# Patient Record
Sex: Male | Born: 1970 | Race: Black or African American | Hispanic: No | Smoking: Current every day smoker
Health system: Southern US, Community
[De-identification: ages and names within clinical notes are randomized; demographics above are authoritative.]

## PROBLEM LIST (undated history)

## (undated) DIAGNOSIS — I10 Essential (primary) hypertension: Secondary | ICD-10-CM

## (undated) DIAGNOSIS — J189 Pneumonia, unspecified organism: Secondary | ICD-10-CM

## (undated) DIAGNOSIS — E785 Hyperlipidemia, unspecified: Secondary | ICD-10-CM

## (undated) DIAGNOSIS — E119 Type 2 diabetes mellitus without complications: Secondary | ICD-10-CM

## (undated) DIAGNOSIS — Z720 Tobacco use: Secondary | ICD-10-CM

## (undated) DIAGNOSIS — N529 Male erectile dysfunction, unspecified: Secondary | ICD-10-CM

## (undated) HISTORY — DX: Male erectile dysfunction, unspecified: N52.9

## (undated) HISTORY — DX: Type 2 diabetes mellitus without complications: E11.9

## (undated) HISTORY — PX: NO PAST SURGERIES: SHX2092

---

## 2016-01-21 ENCOUNTER — Encounter: Payer: Self-pay | Admitting: *Deleted

## 2016-01-21 ENCOUNTER — Emergency Department: Payer: Self-pay

## 2016-01-21 ENCOUNTER — Emergency Department
Admission: EM | Admit: 2016-01-21 | Discharge: 2016-01-21 | Disposition: A | Discharge status: Home / Self Care | Payer: Self-pay | Attending: Emergency Medicine | Admitting: Emergency Medicine

## 2016-01-21 DIAGNOSIS — F17210 Nicotine dependence, cigarettes, uncomplicated: Secondary | ICD-10-CM | POA: Insufficient documentation

## 2016-01-21 DIAGNOSIS — J189 Pneumonia, unspecified organism: Secondary | ICD-10-CM

## 2016-01-21 DIAGNOSIS — J181 Lobar pneumonia, unspecified organism: Secondary | ICD-10-CM | POA: Insufficient documentation

## 2016-01-21 DIAGNOSIS — I10 Essential (primary) hypertension: Secondary | ICD-10-CM | POA: Insufficient documentation

## 2016-01-21 MED ORDER — GUAIFENESIN-CODEINE 100-10 MG/5ML PO SOLN: 5.0000 mL | Freq: Three times a day (TID) | ORAL | 0 refills | Status: DC | PRN

## 2016-01-21 MED ORDER — AZITHROMYCIN 500 MG PO TABS
500.0000 mg | ORAL_TABLET | Freq: Once | ORAL | Status: AC
  Administered 2016-01-21: 500 mg via ORAL
  Filled 2016-01-21: qty 1

## 2016-01-21 MED ORDER — AMLODIPINE BESYLATE 5 MG PO TABS: 5.0000 mg | ORAL_TABLET | Freq: Every day | ORAL | 1 refills | Status: DC

## 2016-01-21 MED ORDER — AZITHROMYCIN 250 MG PO TABS: 250.0000 mg | ORAL_TABLET | Freq: Every day | ORAL | 0 refills | Status: AC

## 2016-01-21 MED ORDER — GUAIFENESIN-CODEINE 100-10 MG/5ML PO SOLN
10.0000 mL | Freq: Once | ORAL | Status: AC
  Administered 2016-01-21: 10 mL via ORAL
  Filled 2016-01-21: qty 10

## 2016-01-21 MED ORDER — ALBUTEROL SULFATE (2.5 MG/3ML) 0.083% IN NEBU
5.0000 mg | INHALATION_SOLUTION | Freq: Once | RESPIRATORY_TRACT | Status: AC
  Administered 2016-01-21: 5 mg via RESPIRATORY_TRACT

## 2016-01-21 MED ORDER — ALBUTEROL SULFATE (2.5 MG/3ML) 0.083% IN NEBU
INHALATION_SOLUTION | RESPIRATORY_TRACT | Status: DC
Start: 2016-01-21 — End: 2016-01-21
  Filled 2016-01-21: qty 6

## 2016-01-21 MED ORDER — ALBUTEROL SULFATE HFA 108 (90 BASE) MCG/ACT IN AERS: 2.0000 | INHALATION_SPRAY | Freq: Four times a day (QID) | RESPIRATORY_TRACT | 0 refills | Status: DC | PRN

## 2016-01-21 NOTE — ED Triage Notes (Signed)
Pt reports cough, congestion for a week, pt reports sputum is yellow/grey

## 2016-01-21 NOTE — ED Provider Notes (Signed)
[facility] Emergency Department Provider Note ____________________________________________  Time seen: 1327  I have reviewed the triage vital signs and the nursing notes.  HISTORY  Chief Complaint  Cough  HPI Christopher Harrell is a 45 y.o. male presents to the ED for evaluation of a one-week complaint of productive cough, congestion, and wheezing. The patient describes increased work of breathing to get the air out of his lungs. He does admit to being an every day smoker and notes that he has had a bronchitis or pneumonia every year and recent history. He also notes previous hospitalization for antibiotics for at least 24 hours secondary to his pneumonia.Additionally hit his wife has recently relocated to the area and he's been out of his blood pressure medicines for several months.  Past Medical History:  Diagnosis Date  . Hypertension     There are no active problems to display for this patient.  History reviewed. No pertinent surgical history.  Prior to Admission medications   Medication Sig Start Date End Date Taking? Authorizing Provider  albuterol (PROVENTIL HFA;VENTOLIN HFA) 108 (90 Base) MCG/ACT inhaler Inhale 2 puffs into the lungs every 6 (six) hours as needed for wheezing or shortness of breath. 01/21/16   Khing Belcher V Bacon Alok Minshall, PA-C  amLODipine (NORVASC) 5 MG tablet Take 1 tablet (5 mg total) by mouth daily. 01/21/16 01/20/17  Caitlyne Ingham V Bacon Deovion Batrez, PA-C  azithromycin (ZITHROMAX Z-PAK) 250 MG tablet Take 1 tablet (250 mg total) by mouth daily. 01/21/16 01/25/16  Shenaya Lebo V Bacon Rob Mciver, PA-C  guaiFENesin-codeine 100-10 MG/5ML syrup Take 5 mLs by mouth 3 (three) times daily as needed for cough. 01/21/16   Markee Matera V Bacon Tylan Briguglio, PA-C   Allergies Review of patient's allergies indicates no known allergies.  No family history on file.  Social History Social History  Substance Use Topics  . Smoking status: Current Every Day Smoker    Packs/day: 0.50   Types: Cigarettes  . Smokeless tobacco: Never Used  . Alcohol use No    Review of Systems  Constitutional: Positive for subjective fever. Eyes: Negative for visual changes. ENT: Negative for sore throat. Cardiovascular: Negative for chest pain. Respiratory: Also did for shortness of breath, wheezing, productive cough. Gastrointestinal: Negative for abdominal pain, vomiting and diarrhea. Musculoskeletal: Negative for chest wall pain. Skin: Negative for rash. Neurological: Negative for headaches, focal weakness or numbness. ____________________________________________  PHYSICAL EXAM:  VITAL SIGNS: ED Triage Vitals [01/21/16 1252]  Enc Vitals Group     BP (!) 186/128     Pulse Rate 95     Resp 16     Temp 98.1 F (36.7 C)     Temp Source Oral     SpO2 97 %     Weight      Height      Head Circumference      Peak Flow      Pain Score      Pain Loc      Pain Edu?      Excl. in [place]?    Constitutional: Alert and oriented. Well appearing and in no distress. Head: Normocephalic and atraumatic.      Eyes: Conjunctivae are normal. PERRL. Normal extraocular movements      Ears: Canals clear. TMs intact bilaterally.   Nose: No congestion/rhinorrhea.   Mouth/Throat: Mucous membranes are moist.   Neck: Supple. No thyromegaly. Hematological/Lymphatic/Immunological: No cervical lymphadenopathy. Cardiovascular: Normal rate, regular rhythm.  Respiratory: Normal respiratory effort. Audible wheezes and rhonchi throughout his lung fields.  Gastrointestinal: Soft and nontender. No distention. Musculoskeletal: Nontender with normal range of motion in all extremities.  Neurologic:  Normal gait without ataxia. Normal speech and language. No gross focal neurologic deficits are appreciated. Skin:  Skin is warm, dry and intact. No rash noted. ____________________________________________   RADIOLOGY  CXR IMPRESSION: Left lower lobe pneumonia.  I, Antonieta Slaven, Dannielle Karvonen,  personally viewed and evaluated these images (plain radiographs) as part of my medical decision making, as well as reviewing the written report by the radiologist. ____________________________________________  PROCEDURES  DuoNeb x 1 Azithromycin 500 mg PO ____________________________________________  INITIAL IMPRESSION / ASSESSMENT AND PLAN / ED COURSE  Patient with a radiology confirmed lower lobe pneumonia. He also has uncontrolled hypertension has been out of his blood pressure meds for several months. Given his of response to treatment and his stable vital signs the patient is agreeable to treat as an outpatient for this presentation. He is given return precautions and verbalizes understanding. He is also provided with a work note for the next 3 days. He will rest, hydrate, and take the remainder of the azithromycin, guaifenesin/codeine syrup, and albuterol as directed. He is also provided with a new prescription for amlodipine to start for uncontrolled hypertension. He is referred to the medical management program of [facility] as well as the local community clinics for routine medical care.  Clinical Course   ____________________________________________  FINAL CLINICAL IMPRESSION(S) / ED DIAGNOSES  Final diagnoses:  Community acquired pneumonia  Uncontrolled hypertension  LLL pneumonia      Christopher Needles, PA-C 01/21/16 [place] Yao, MD 01/24/16 [phone]

## 2016-01-21 NOTE — Discharge Instructions (Signed)
Your x-ray revealed a pneumonia in your left lung. You have been started on an antibiotic in the ED. It is to be dosed 1 tab daily for the next 4 days. You may also use the prescription cough syrup for cough and chest discomfort. You are also being started on a class of blood pressure medicine called a calcium channel blocker. It will lower your blood pressure by allowing your blood vessels to dilate. You should monitor your blood pressure regularly and follow-up with one of the local community clinics for routine medical care. Return to the ED  immediately for signs of worsening cough, shortness of breath, or fevers.

## 2016-01-23 ENCOUNTER — Observation Stay
Admission: EM | Admit: 2016-01-23 | Discharge: 2016-01-24 | Disposition: A | Discharge status: Home / Self Care | Payer: Self-pay | Attending: Internal Medicine | Admitting: Internal Medicine

## 2016-01-23 ENCOUNTER — Emergency Department: Payer: Self-pay

## 2016-01-23 ENCOUNTER — Encounter: Payer: Self-pay | Admitting: Emergency Medicine

## 2016-01-23 DIAGNOSIS — I10 Essential (primary) hypertension: Secondary | ICD-10-CM | POA: Diagnosis present

## 2016-01-23 DIAGNOSIS — R51 Headache: Secondary | ICD-10-CM | POA: Insufficient documentation

## 2016-01-23 DIAGNOSIS — R079 Chest pain, unspecified: Secondary | ICD-10-CM | POA: Diagnosis present

## 2016-01-23 DIAGNOSIS — Z79899 Other long term (current) drug therapy: Secondary | ICD-10-CM | POA: Insufficient documentation

## 2016-01-23 DIAGNOSIS — J690 Pneumonitis due to inhalation of food and vomit: Secondary | ICD-10-CM

## 2016-01-23 DIAGNOSIS — R748 Abnormal levels of other serum enzymes: Secondary | ICD-10-CM | POA: Insufficient documentation

## 2016-01-23 DIAGNOSIS — J441 Chronic obstructive pulmonary disease with (acute) exacerbation: Secondary | ICD-10-CM

## 2016-01-23 DIAGNOSIS — J189 Pneumonia, unspecified organism: Secondary | ICD-10-CM | POA: Diagnosis present

## 2016-01-23 DIAGNOSIS — R7989 Other specified abnormal findings of blood chemistry: Secondary | ICD-10-CM | POA: Diagnosis present

## 2016-01-23 DIAGNOSIS — F17210 Nicotine dependence, cigarettes, uncomplicated: Secondary | ICD-10-CM | POA: Insufficient documentation

## 2016-01-23 DIAGNOSIS — R0781 Pleurodynia: Principal | ICD-10-CM | POA: Insufficient documentation

## 2016-01-23 DIAGNOSIS — R778 Other specified abnormalities of plasma proteins: Secondary | ICD-10-CM | POA: Diagnosis present

## 2016-01-23 HISTORY — DX: Pneumonia, unspecified organism: J18.9

## 2016-01-23 HISTORY — DX: Essential (primary) hypertension: I10

## 2016-01-23 HISTORY — DX: Tobacco use: Z72.0

## 2016-01-23 LAB — CBC
HEMATOCRIT: 47.6 % (ref 40.0–52.0)
Hemoglobin: 16.7 g/dL (ref 13.0–18.0)
MCH: 30.7 pg (ref 26.0–34.0)
MCHC: 35.1 g/dL (ref 32.0–36.0)
MCV: 87.6 fL (ref 80.0–100.0)
Platelets: 256 10*3/uL (ref 150–440)
RBC: 5.43 MIL/uL (ref 4.40–5.90)
RDW: 13.9 % (ref 11.5–14.5)
WBC: 6.9 10*3/uL (ref 3.8–10.6)

## 2016-01-23 LAB — BASIC METABOLIC PANEL
Anion gap: 7 (ref 5–15)
BUN: 13 mg/dL (ref 6–20)
CHLORIDE: 106 mmol/L (ref 101–111)
CO2: 27 mmol/L (ref 22–32)
Calcium: 9.5 mg/dL (ref 8.9–10.3)
Creatinine, Ser: 1 mg/dL (ref 0.61–1.24)
GFR calc Af Amer: >60 mL/min (ref >60)
GFR calc non Af Amer: >60 mL/min (ref >60)
GLUCOSE: 124 mg/dL — AB (ref 65–99)
POTASSIUM: 3.5 mmol/L (ref 3.5–5.1)
SODIUM: 140 mmol/L (ref 135–145)

## 2016-01-23 LAB — TROPONIN I
Troponin I: 0.03 ng/mL (ref <0.03)
Troponin I: 0.03 ng/mL (ref <0.03)

## 2016-01-23 MED ORDER — HYDROCHLOROTHIAZIDE 50 MG PO TABS
50.0000 mg | ORAL_TABLET | Freq: Every day | ORAL | Status: DC
  Administered 2016-01-23 – 2016-01-24 (×2): 50 mg via ORAL
  Filled 2016-01-23 (×2): qty 1

## 2016-01-23 MED ORDER — OXYCODONE HCL 5 MG PO TABS
5.0000 mg | ORAL_TABLET | ORAL | Status: DC | PRN
  Administered 2016-01-23 – 2016-01-24 (×3): 5 mg via ORAL
  Filled 2016-01-23 (×3): qty 1

## 2016-01-23 MED ORDER — KETOROLAC TROMETHAMINE 30 MG/ML IJ SOLN
30.0000 mg | Freq: Once | INTRAMUSCULAR | Status: AC
  Administered 2016-01-23: 30 mg via INTRAVENOUS
  Filled 2016-01-23: qty 1

## 2016-01-23 MED ORDER — NITROGLYCERIN 2 % TD OINT
0.5000 [in_us] | TOPICAL_OINTMENT | Freq: Once | TRANSDERMAL | Status: AC
  Administered 2016-01-23: 0.5 [in_us] via TOPICAL

## 2016-01-23 MED ORDER — PREDNISONE 20 MG PO TABS
60.0000 mg | ORAL_TABLET | Freq: Once | ORAL | Status: AC
  Administered 2016-01-23: 60 mg via ORAL
  Filled 2016-01-23: qty 3

## 2016-01-23 MED ORDER — IPRATROPIUM-ALBUTEROL 0.5-2.5 (3) MG/3ML IN SOLN
3.0000 mL | Freq: Once | RESPIRATORY_TRACT | Status: AC
  Administered 2016-01-23: 3 mL via RESPIRATORY_TRACT
  Filled 2016-01-23: qty 3

## 2016-01-23 MED ORDER — LABETALOL HCL 5 MG/ML IV SOLN
10.0000 mg | Freq: Once | INTRAVENOUS | Status: AC
  Administered 2016-01-23: 10 mg via INTRAVENOUS
  Filled 2016-01-23: qty 4

## 2016-01-23 MED ORDER — LABETALOL HCL 5 MG/ML IV SOLN
10.0000 mg | INTRAVENOUS | Status: DC | PRN
  Administered 2016-01-23 – 2016-01-24 (×2): 10 mg via INTRAVENOUS
  Filled 2016-01-23 (×3): qty 4

## 2016-01-23 MED ORDER — ACETAMINOPHEN 500 MG PO TABS
1000.0000 mg | ORAL_TABLET | Freq: Once | ORAL | Status: AC
  Administered 2016-01-23: 1000 mg via ORAL
  Filled 2016-01-23: qty 2

## 2016-01-23 MED ORDER — NITROGLYCERIN 2 % TD OINT
TOPICAL_OINTMENT | TRANSDERMAL | Status: AC
  Administered 2016-01-23: 0.5 [in_us] via TOPICAL
  Filled 2016-01-23: qty 1

## 2016-01-23 NOTE — ED Triage Notes (Addendum)
Pt arrived to ED with c/o of SOB with a productive cough that been going on for about a week. Pt states he is coughing up "dark gray" mucus. Pt recently seen in ED and dx with pneumonia. Pt taking rx but "getting worse".

## 2016-01-23 NOTE — ED Provider Notes (Signed)
[facility] Emergency Department Provider Note  ____________________________________________  Time seen: Approximately 4:40 PM  I have reviewed the triage vital signs and the nursing notes.   HISTORY  Chief Complaint Shortness of Breath   HPI Christopher Harrell is a 45 y.o. male history of hypertension and smoking who presents for evaluation of shortness of breath and chest tightness. Patient was seen here 2 days ago and was diagnosed with left-sided pneumonia. He was started on Z-Pak, albuterol, and he was restarted on his antihypertensive medication that he had been off for over a year. Patient reports that he's been taking the Z-Pak as prescribed. He is also using the albuterol 3 times a day and reports relief of his shortness of breath and chest tightness when he uses it however symptoms recur. He reports that he could barely sleep last night due to the chest pain and coughing. He describes the chest pain as tightness, that resolves with albuterol, associated with wheezing. He has not filled the prescription for his antihypertensive. He continues to smoke. He also endorses wheezing. No longer having fever or chills. He has a productive cough with yellow phlegm.  Past Medical History:  Diagnosis Date  . Hypertension     There are no active problems to display for this patient.   History reviewed. No pertinent surgical history.  Prior to Admission medications   Medication Sig Start Date End Date Taking? Authorizing Provider  albuterol (PROVENTIL HFA;VENTOLIN HFA) 108 (90 Base) MCG/ACT inhaler Inhale 2 puffs into the lungs every 6 (six) hours as needed for wheezing or shortness of breath. 01/21/16  Yes Jenise V Bacon Menshew, PA-C  azithromycin (ZITHROMAX Z-PAK) 250 MG tablet Take 1 tablet (250 mg total) by mouth daily. Patient taking differently: Take 250 mg by mouth daily. Took 2 tablets on 01/22/2016 Took 1 tablet on 01/23/2016 01/21/16 01/25/16 Yes Jenise V  Bacon Menshew, PA-C  amLODipine (NORVASC) 5 MG tablet Take 1 tablet (5 mg total) by mouth daily. Patient not taking: Reported on 01/23/2016 01/21/16 01/20/17  Alita Chyle Bacon Menshew, PA-C  guaiFENesin-codeine 100-10 MG/5ML syrup Take 5 mLs by mouth 3 (three) times daily as needed for cough. Patient not taking: Reported on 01/23/2016 01/21/16   Dannielle Karvonen Menshew, PA-C    Allergies Review of patient's allergies indicates no known allergies.  History reviewed. No pertinent family history.  Social History Social History  Substance Use Topics  . Smoking status: Current Every Day Smoker    Packs/day: 0.50    Types: Cigarettes  . Smokeless tobacco: Never Used  . Alcohol use No    Review of Systems  Constitutional: Negative for fever. Eyes: Negative for visual changes. ENT: Negative for sore throat. Cardiovascular: + chest tightness Respiratory: + SOB and wheezing and coughing Gastrointestinal: Negative for abdominal pain, vomiting or diarrhea. Genitourinary: Negative for dysuria. Musculoskeletal: Negative for back pain. Skin: Negative for rash. Neurological: Negative for headaches, weakness or numbness.  ____________________________________________   PHYSICAL EXAM:  VITAL SIGNS: ED Triage Vitals  Enc Vitals Group     BP 01/23/16 1507 (!) 169/115     Pulse Rate 01/23/16 1507 92     Resp 01/23/16 1507 18     Temp 01/23/16 1507 98.6 F (37 C)     Temp Source 01/23/16 1507 Oral     SpO2 01/23/16 1507 99 %     Weight 01/23/16 1508 190 lb (86.2 kg)     Height 01/23/16 1508 5\' 9"  (1.753 m)  Head Circumference --      Peak Flow --      Pain Score 01/23/16 1517 7     Pain Loc --      Pain Edu? --      Excl. in [place]? --     Constitutional: Alert and oriented. Well appearing and in no apparent distress. HEENT:      Head: Normocephalic and atraumatic.         Eyes: Conjunctivae are normal. Sclera is non-icteric. EOMI. PERRL      Mouth/Throat: Mucous membranes are moist.         Neck: Supple with no signs of meningismus. Cardiovascular: Regular rate and rhythm. No murmurs, gallops, or rubs. 2+ symmetrical distal pulses are present in all extremities. No JVD. Respiratory: Normal respiratory effort. Decreased air movement bilaterally with expiratory wheezes  Gastrointestinal: Soft, non tender, and non distended with positive bowel sounds. No rebound or guarding. Musculoskeletal: Nontender with normal range of motion in all extremities. No edema, cyanosis, or erythema of extremities. Neurologic: Normal speech and language. Face is symmetric. Moving all extremities. No gross focal neurologic deficits are appreciated. Skin: Skin is warm, dry and intact. No rash noted. Psychiatric: Mood and affect are normal. Speech and behavior are normal.  ____________________________________________   LABS (all labs ordered are listed, but only abnormal results are displayed)  Labs Reviewed  BASIC METABOLIC PANEL - Abnormal; Notable for the following:       Result Value   Glucose, Bld 124 (*)    All other components within normal limits  TROPONIN I - Abnormal; Notable for the following:    Troponin I 0.03 (*)    All other components within normal limits  TROPONIN I - Abnormal; Notable for the following:    Troponin I 0.03 (*)    All other components within normal limits  CBC   ____________________________________________  EKG  ED ECG REPORT I, Rudene Re, the attending physician, personally viewed and interpreted this ECG.  Normal sinus rhythm, rate of 83, normal intervals, normal axis, T-wave inversions on inferior and lateral leads. No prior for comparison. No ST elevations.  19:14 - Normal sinus rhythm, rate of 80, normal intervals, normal axis, resolution of T-wave inversions in lateral leads and improvement on the inferior leads. No ST elevation ____________________________________________  RADIOLOGY  CXR: Improved left lower lobe pneumonia.  No new  findings ____________________________________________   PROCEDURES  Procedure(s) performed: None Procedures Critical Care performed:  None ____________________________________________   INITIAL IMPRESSION / ASSESSMENT AND PLAN / ED COURSE  45 y.o. male history of hypertension and smoking who presents for evaluation of shortness of breath and chest tightness after being diagnosed with pneumonia 2 days ago. Chest x-ray showing improving pneumonia. Patient has no tachycardia, no fever, normal white count. EKG done in triage showing T-wave inversions on inferior and lateral leads with no prior for comparison. Patient's chest tightness in the setting of wheezing and decreased air movement which improves with albuterol is concerning for COPD exacerbation on a active smoker with current pneumonia and less likely to be ACS. Initial troponin was 0.03. Watch patient for 3 hours on telemetry, given DuoNeb treatments, prednisone, patient already took his azithromycin today. We'll get a repeat troponin and EKG 3 hours and discussed for close follow-up with cardiology. We'll restart patient on hydrochlorothiazide for hypertension.  Clinical Course  Comment By Time  Patient's chest pain and shortness of breath improved with DuoNeb treatments. His initial EKG in triage was concerning and  a repeat was done which showed resolution the T wave inversions on the lateral leads and improvement on the inferior leads. Second troponin remains unchanged at 0.03. Patient remains persistently hypertensive, IV labetalol was ordered at this time. I discussed patient with Dr. Abigail Butts, cardiology on call who evaluated the EKGs and recommended admission and for stress test. Patient is in agreement with this plan. I discussed with the hospitalist and we'll admit at this time. Rudene Re, MD 09/24 2054    Pertinent labs & imaging results that were available during my care of the patient were reviewed by me and considered in  my medical decision making (see chart for details).    ____________________________________________   FINAL CLINICAL IMPRESSION(S) / ED DIAGNOSES  Final diagnoses:  Chest pain, unspecified chest pain type  Aspiration pneumonia of left lung, unspecified aspiration pneumonia type, unspecified part of lung ([place])  COPD exacerbation ([place])      NEW MEDICATIONS STARTED DURING THIS VISIT:  New Prescriptions   No medications on file     Note:  This document was prepared using Dragon voice recognition software and may include unintentional dictation errors.    Rudene Re, MD 01/23/16 [phone]

## 2016-01-23 NOTE — H&P (Signed)
[name] [place] NAME: Christopher Harrell    MR#:  [id number]  DATE OF BIRTH:  1970-06-24  DATE OF ADMISSION:  01/23/2016  PRIMARY CARE PHYSICIAN: No PCP Per Patient   REQUESTING/REFERRING PHYSICIAN: Alfred Levins, MD  CHIEF COMPLAINT:   Chief Complaint  Patient presents with  . Shortness of Breath    HISTORY OF PRESENT ILLNESS:  Christopher Harrell  is a 45 y.o. male who presents with Chest pain and shortness of breath. Patient states that he was diagnosed with pneumonia several days ago and was placed on azithromycin. Today he developed significant chest pain as well as back/flank pain, so he came to the ED for further evaluation. Chest x-ray shows improving pneumonia. However, his EKG showed T-wave inversions in inferior and lateral leads, which improved some in the lateral leads with nitroglycerin. Cardiology was contacted from the ED and recommended patient come in for monitoring of his cardiac enzymes and possible stress test tomorrow. Hospitalists were called for admission  PAST MEDICAL HISTORY:   Past Medical History:  Diagnosis Date  . Hypertension     PAST SURGICAL HISTORY:   Past Surgical History:  Procedure Laterality Date  . NO PAST SURGERIES      SOCIAL HISTORY:   Social History  Substance Use Topics  . Smoking status: Current Every Day Smoker    Packs/day: 0.50    Types: Cigarettes  . Smokeless tobacco: Never Used  . Alcohol use No    FAMILY HISTORY:  History reviewed. No pertinent family history.  DRUG ALLERGIES:  No Known Allergies  MEDICATIONS AT HOME:   Prior to Admission medications   Medication Sig Start Date End Date Taking? Authorizing Provider  albuterol (PROVENTIL HFA;VENTOLIN HFA) 108 (90 Base) MCG/ACT inhaler Inhale 2 puffs into the lungs every 6 (six) hours as needed for wheezing or shortness of breath. 01/21/16  Yes Jenise V Bacon Menshew, PA-C  azithromycin (ZITHROMAX Z-PAK) 250 MG tablet  Take 1 tablet (250 mg total) by mouth daily. Patient taking differently: Take 250 mg by mouth daily. Took 2 tablets on 01/22/2016 Took 1 tablet on 01/23/2016 01/21/16 01/25/16 Yes Jenise V Bacon Menshew, PA-C  amLODipine (NORVASC) 5 MG tablet Take 1 tablet (5 mg total) by mouth daily. Patient not taking: Reported on 01/23/2016 01/21/16 01/20/17  Alita Chyle Bacon Menshew, PA-C  guaiFENesin-codeine 100-10 MG/5ML syrup Take 5 mLs by mouth 3 (three) times daily as needed for cough. Patient not taking: Reported on 01/23/2016 01/21/16   Dannielle Karvonen Menshew, PA-C    REVIEW OF SYSTEMS:  Review of Systems  Constitutional: Negative for chills, fever, malaise/fatigue and weight loss.  HENT: Negative for ear pain, hearing loss and tinnitus.   Eyes: Negative for blurred vision, double vision, pain and redness.  Respiratory: Positive for cough and shortness of breath. Negative for hemoptysis.   Cardiovascular: Positive for chest pain. Negative for palpitations, orthopnea and leg swelling.  Gastrointestinal: Negative for abdominal pain, constipation, diarrhea, nausea and vomiting.  Genitourinary: Positive for flank pain. Negative for dysuria, frequency and hematuria.  Musculoskeletal: Negative for back pain, joint pain and neck pain.  Skin:       No acne, rash, or lesions  Neurological: Negative for dizziness, tremors, focal weakness and weakness.  Endo/Heme/Allergies: Negative for polydipsia. Does not bruise/bleed easily.  Psychiatric/Behavioral: Negative for depression. The patient is not nervous/anxious and does not have insomnia.      VITAL SIGNS:   Vitals:   01/23/16 1730  01/23/16 1900 01/23/16 2200 01/23/16 2230  BP: (!) 168/109 (!) 183/126 (!) 188/121 (!) 179/117  Pulse: 76 75 81 77  Resp: 19 12 16 19   Temp:      TempSrc:      SpO2: 91% 98% 93% 94%  Weight:      Height:       Wt Readings from Last 3 Encounters:  01/23/16 86.2 kg (190 lb)    PHYSICAL EXAMINATION:  Physical Exam  Vitals  reviewed. Constitutional: He is oriented to person, place, and time. He appears well-developed and well-nourished. No distress.  HENT:  Head: Normocephalic and atraumatic.  Mouth/Throat: Oropharynx is clear and moist.  Eyes: Conjunctivae and EOM are normal. Pupils are equal, round, and reactive to light. No scleral icterus.  Neck: Normal range of motion. Neck supple. No JVD present. No thyromegaly present.  Cardiovascular: Normal rate, regular rhythm and intact distal pulses.  Exam reveals no gallop and no friction rub.   No murmur heard. Respiratory: Effort normal. No respiratory distress. He has no wheezes. He has no rales.  Scattered coarse breath sounds  GI: Soft. Bowel sounds are normal. He exhibits no distension. There is no tenderness.  Musculoskeletal: Normal range of motion. He exhibits tenderness (tender to palpation bilateral CVA regions of his back with some associated muscular spasm). He exhibits no edema.  No arthritis, no gout  Lymphadenopathy:    He has no cervical adenopathy.  Neurological: He is alert and oriented to person, place, and time. No cranial nerve deficit.  No dysarthria, no aphasia  Skin: Skin is warm and dry. No rash noted. No erythema.  Psychiatric: He has a normal mood and affect. His behavior is normal. Judgment and thought content normal.    LABORATORY PANEL:   CBC  Recent Labs Lab 01/23/16 1533  WBC 6.9  HGB 16.7  HCT 47.6  PLT 256   ------------------------------------------------------------------------------------------------------------------  Chemistries   Recent Labs Lab 01/23/16 1533  NA 140  K 3.5  CL 106  CO2 27  GLUCOSE 124*  BUN 13  CREATININE 1.00  CALCIUM 9.5   ------------------------------------------------------------------------------------------------------------------  Cardiac Enzymes  Recent Labs Lab 01/23/16 1849  TROPONINI 0.03*    ------------------------------------------------------------------------------------------------------------------  RADIOLOGY:  Dg Chest 2 View  Result Date: 01/23/2016 CLINICAL DATA:  Shortness of breath. EXAM: CHEST  2 VIEW COMPARISON:  01/21/2016 FINDINGS: Midline trachea. Normal heart size and mediastinal contours. No pleural effusion or pneumothorax. Improved left lower lobe airspace disease. No new pulmonary opacity identified. IMPRESSION: Improved left lower lobe pneumonia.  No new findings. Electronically Signed   By: Abigail Miyamoto M.D.   On: 01/23/2016 15:54    EKG:   Orders placed or performed during the hospital encounter of 01/23/16  . EKG 12-Lead  . EKG 12-Lead  . ED EKG within 10 minutes  . ED EKG within 10 minutes  . ED EKG  . ED EKG  . EKG 12-Lead  . EKG 12-Lead    IMPRESSION AND PLAN:  Principal Problem:   Chest pain - patient has no pneumonia which is shown to be improving on imaging studies tonight. He also had an elevated troponin as well as EKG changes. Concern for possible ACS. We will admit him tonight, trend his cardiac enzymes, get an echocardiogram and a cardiology consult in the morning. If his enzymes rise significantly tonight we will place him on a heparin drip. Active Problems:   CAP (community acquired pneumonia) - patient has finished 4 days of azithromycin with improvement  in his pneumonia, we will continue his azithromycin for a total of 7 days treatment   Elevated troponin - trend enzymes as above, otherwise workup as above   HTN (hypertension) - this is uncontrolled, though the patient is in pain. He states that he stopped taking his blood pressure medicine some time ago. We will try and control his pain, as well as restart his blood pressure medicines. Additional antihypertensives when necessary to keep his blood pressure less than 160/100, especially given his current cardiac workup.  All the records are reviewed and case discussed with ED  provider. Management plans discussed with the patient and/or family.  DVT PROPHYLAXIS: SubQ lovenox  GI PROPHYLAXIS: None  ADMISSION STATUS: Observation  CODE STATUS: Full Code Status History    This patient does not have a recorded code status. Please follow your organizational policy for patients in this situation.      TOTAL TIME TAKING CARE OF THIS PATIENT: 40 minutes.    Kadisha Goodine [place] 01/23/2016, 11:08 PM  [facility]  Office  [phone]  CC: Primary care physician; No PCP Per Patient

## 2016-01-24 ENCOUNTER — Observation Stay
Admit: 2016-01-24 | Discharge: 2016-01-24 | Disposition: A | Discharge status: Home / Self Care | Payer: Self-pay | Attending: Internal Medicine | Admitting: Internal Medicine

## 2016-01-24 ENCOUNTER — Observation Stay (HOSPITAL_BASED_OUTPATIENT_CLINIC_OR_DEPARTMENT_OTHER): Payer: Self-pay

## 2016-01-24 DIAGNOSIS — R079 Chest pain, unspecified: Secondary | ICD-10-CM

## 2016-01-24 DIAGNOSIS — R7989 Other specified abnormal findings of blood chemistry: Secondary | ICD-10-CM

## 2016-01-24 DIAGNOSIS — R0789 Other chest pain: Secondary | ICD-10-CM

## 2016-01-24 DIAGNOSIS — R071 Chest pain on breathing: Secondary | ICD-10-CM

## 2016-01-24 LAB — NM MYOCAR MULTI W/SPECT W/WALL MOTION / EF
CHL CUP RESTING HR STRESS: 65 {beats}/min
CSEPED: 0 min
CSEPEDS: 0 s
CSEPEW: 1 METS
LV sys vol: 73 mL
LVDIAVOL: 147 mL (ref 62–150)
MPHR: 174 {beats}/min
NUC STRESS TID: 1.14
Peak HR: 103 {beats}/min
Percent HR: 59 %
SDS: 0
SRS: 0
SSS: 0

## 2016-01-24 LAB — CBC
HCT: 45.5 % (ref 40.0–52.0)
HEMOGLOBIN: 15.8 g/dL (ref 13.0–18.0)
MCH: 30.3 pg (ref 26.0–34.0)
MCHC: 34.7 g/dL (ref 32.0–36.0)
MCV: 87.3 fL (ref 80.0–100.0)
PLATELETS: 251 10*3/uL (ref 150–440)
RBC: 5.21 MIL/uL (ref 4.40–5.90)
RDW: 14.1 % (ref 11.5–14.5)
WBC: 11.7 10*3/uL — AB (ref 3.8–10.6)

## 2016-01-24 LAB — BASIC METABOLIC PANEL
ANION GAP: 9 (ref 5–15)
BUN: 15 mg/dL (ref 6–20)
CALCIUM: 9.4 mg/dL (ref 8.9–10.3)
CHLORIDE: 104 mmol/L (ref 101–111)
CO2: 24 mmol/L (ref 22–32)
CREATININE: 1.01 mg/dL (ref 0.61–1.24)
GFR calc non Af Amer: >60 mL/min (ref >60)
Glucose, Bld: 136 mg/dL — ABNORMAL HIGH (ref 65–99)
Potassium: 3.9 mmol/L (ref 3.5–5.1)
SODIUM: 137 mmol/L (ref 135–145)

## 2016-01-24 LAB — TROPONIN I: TROPONIN I: 0.03 ng/mL — AB (ref <0.03)

## 2016-01-24 MED ORDER — AZITHROMYCIN 250 MG PO TABS
500.0000 mg | ORAL_TABLET | Freq: Every day | ORAL | Status: DC
  Administered 2016-01-24: 500 mg via ORAL
  Filled 2016-01-24: qty 2

## 2016-01-24 MED ORDER — HYDRALAZINE HCL 20 MG/ML IJ SOLN
10.0000 mg | INTRAMUSCULAR | Status: DC | PRN
  Administered 2016-01-24 (×2): 10 mg via INTRAVENOUS
  Filled 2016-01-24: qty 1

## 2016-01-24 MED ORDER — ACETAMINOPHEN 650 MG RE SUPP: 650.0000 mg | Freq: Four times a day (QID) | RECTAL | Status: DC | PRN

## 2016-01-24 MED ORDER — SODIUM CHLORIDE 0.9 % IV SOLN
INTRAVENOUS | Status: DC
  Administered 2016-01-24: 04:00:00 via INTRAVENOUS

## 2016-01-24 MED ORDER — MORPHINE SULFATE (PF) 2 MG/ML IV SOLN
INTRAVENOUS | Status: AC
Start: 2016-01-24 — End: 2016-01-24
  Administered 2016-01-24: 2 mg via INTRAVENOUS
  Filled 2016-01-24: qty 1

## 2016-01-24 MED ORDER — GUAIFENESIN-CODEINE 100-10 MG/5ML PO SOLN: 5.0000 mL | Freq: Three times a day (TID) | ORAL | Status: DC | PRN

## 2016-01-24 MED ORDER — NITROGLYCERIN IN D5W 200-5 MCG/ML-% IV SOLN
INTRAVENOUS | Status: AC
  Administered 2016-01-24: 5 ug/min via INTRAVENOUS
  Filled 2016-01-24: qty 250

## 2016-01-24 MED ORDER — NITROGLYCERIN IN D5W 200-5 MCG/ML-% IV SOLN
0.0000 ug/min | INTRAVENOUS | Status: DC
  Administered 2016-01-24: 5 ug/min via INTRAVENOUS

## 2016-01-24 MED ORDER — ONDANSETRON HCL 4 MG/2ML IJ SOLN
4.0000 mg | Freq: Four times a day (QID) | INTRAMUSCULAR | Status: DC | PRN
  Administered 2016-01-24: 4 mg via INTRAVENOUS
  Filled 2016-01-24: qty 2

## 2016-01-24 MED ORDER — ONDANSETRON HCL 4 MG PO TABS: 4.0000 mg | ORAL_TABLET | Freq: Four times a day (QID) | ORAL | Status: DC | PRN

## 2016-01-24 MED ORDER — MORPHINE SULFATE (PF) 2 MG/ML IV SOLN
2.0000 mg | INTRAVENOUS | Status: DC | PRN
  Administered 2016-01-24: 2 mg via INTRAVENOUS
  Filled 2016-01-24: qty 1

## 2016-01-24 MED ORDER — MORPHINE SULFATE (PF) 2 MG/ML IV SOLN
2.0000 mg | Freq: Once | INTRAVENOUS | Status: AC
  Administered 2016-01-24: 2 mg via INTRAVENOUS

## 2016-01-24 MED ORDER — AMLODIPINE BESYLATE 5 MG PO TABS
5.0000 mg | ORAL_TABLET | Freq: Every day | ORAL | Status: DC
  Administered 2016-01-24: 5 mg via ORAL
  Filled 2016-01-24: qty 1

## 2016-01-24 MED ORDER — HYDRALAZINE HCL 20 MG/ML IJ SOLN
INTRAMUSCULAR | Status: AC
  Administered 2016-01-24: 10 mg via INTRAVENOUS
  Filled 2016-01-24: qty 1

## 2016-01-24 MED ORDER — ACETAMINOPHEN 325 MG PO TABS
650.0000 mg | ORAL_TABLET | Freq: Four times a day (QID) | ORAL | Status: DC | PRN
  Administered 2016-01-24: 650 mg via ORAL
  Filled 2016-01-24: qty 2

## 2016-01-24 MED ORDER — ENOXAPARIN SODIUM 40 MG/0.4ML ~~LOC~~ SOLN: 40.0000 mg | SUBCUTANEOUS | Status: DC

## 2016-01-24 MED ORDER — AMLODIPINE BESYLATE 10 MG PO TABS
10.0000 mg | ORAL_TABLET | Freq: Every day | ORAL | 1 refills | Status: DC
Start: 2016-01-24 — End: 2016-05-30

## 2016-01-24 MED ORDER — INFLUENZA VAC SPLIT QUAD 0.5 ML IM SUSY: 0.5000 mL | PREFILLED_SYRINGE | INTRAMUSCULAR | Status: DC

## 2016-01-24 MED ORDER — REGADENOSON 0.4 MG/5ML IV SOLN
0.4000 mg | Freq: Once | INTRAVENOUS | Status: AC
  Administered 2016-01-24: 0.4 mg via INTRAVENOUS

## 2016-01-24 MED ORDER — LABETALOL HCL 5 MG/ML IV SOLN: 5.0000 mg | INTRAVENOUS | Status: DC | PRN

## 2016-01-24 MED ORDER — TECHNETIUM TC 99M TETROFOSMIN IV KIT
28.3120 | PACK | Freq: Once | INTRAVENOUS | Status: AC | PRN
  Administered 2016-01-24: 28.312 via INTRAVENOUS

## 2016-01-24 MED ORDER — TECHNETIUM TC 99M TETROFOSMIN IV KIT
13.5700 | PACK | Freq: Once | INTRAVENOUS | Status: AC | PRN
  Administered 2016-01-24: 13.57 via INTRAVENOUS

## 2016-01-24 NOTE — Progress Notes (Signed)
  Echocardiogram 2D Echocardiogram has been performed.  Jennette Dubin 01/24/2016, 3:07 PM

## 2016-01-24 NOTE — Progress Notes (Signed)
Back from nuclear medicine, complaining of headache.  Will medicate accordingly

## 2016-01-24 NOTE — Progress Notes (Signed)
To nuclear medicine via bed 

## 2016-01-24 NOTE — Discharge Summary (Signed)
[facility] NAME: Anari Tinson    MR#:  [id number]  DATE OF BIRTH:  22-Apr-1971  DATE OF ADMISSION:  01/23/2016   ADMITTING PHYSICIAN: Lance Coon, MD  DATE OF DISCHARGE: 01/24/16  PRIMARY CARE PHYSICIAN: No PCP Per Patient   ADMISSION DIAGNOSIS:   COPD exacerbation ([place]) [J44.1] Chest pain, unspecified chest pain type [R07.9] Aspiration pneumonia of left lung, unspecified aspiration pneumonia type, unspecified part of lung ([place]) [J69.0]  DISCHARGE DIAGNOSIS:   Principal Problem:   Chest pain Active Problems:   CAP (community acquired pneumonia)   Elevated troponin   HTN (hypertension)   SECONDARY DIAGNOSIS:   Past Medical History:  Diagnosis Date  . Community acquired pneumonia    a. Dx 01/21/2016 - LLL PNA.  . Essential hypertension   . Tobacco abuse     HOSPITAL COURSE:   45 year old male with past medical history significant for hypertension not compliant with his medications, smoking presents to hospital secondary to chest pain.  #1 chest pain-pleuritic chest pain from his pneumonia. Chest x-ray showing improvement in his pneumonia. -Due to his blood pressure being high and risk for cardiac disease, and also EKG changes, echocardiogram and stress test were done. -LVH noted, stress test did not show any impending blockages. -History pain has improved with pain medications. Troponins have remained negative. -Cardiology has been consulted in the hospital  #2 hypertension-known hypertension, not taking medications at home. According to wife, always stays elevated and get symptomatic when blood pressure drops. -Being discharged on Norvasc. Advised to follow-up with PCP  #3 left lower lobe pneumonia-resolving on x-ray. Started on Z-Pak as outpatient, has 2 more days. Advised to finish off the course  #4 headache-secondary to nitro drip. It has been discontinued and headache has improved.  #5 tobacco use  disorder-counseled  Discharge home today   DISCHARGE CONDITIONS:   Stable  CONSULTS OBTAINED:   Treatment Team:  Wellington Hampshire, MD  DRUG ALLERGIES:   No Known Allergies DISCHARGE MEDICATIONS:     Medication List    TAKE these medications   albuterol 108 (90 Base) MCG/ACT inhaler Commonly known as:  PROVENTIL HFA;VENTOLIN HFA Inhale 2 puffs into the lungs every 6 (six) hours as needed for wheezing or shortness of breath.   amLODipine 10 MG tablet Commonly known as:  NORVASC Take 1 tablet (10 mg total) by mouth daily. What changed:  medication strength  how much to take   azithromycin 250 MG tablet Commonly known as:  ZITHROMAX Z-PAK Take 1 tablet (250 mg total) by mouth daily. What changed:  additional instructions   guaiFENesin-codeine 100-10 MG/5ML syrup Take 5 mLs by mouth 3 (three) times daily as needed for cough.        DISCHARGE INSTRUCTIONS:   1. PCP f/u in 1-2 weeks  DIET:   Cardiac diet  ACTIVITY:   Activity as tolerated  OXYGEN:   Home Oxygen: No.  Oxygen Delivery: room air  DISCHARGE LOCATION:   home   If you experience worsening of your admission symptoms, develop shortness of breath, life threatening emergency, suicidal or homicidal thoughts you must seek medical attention immediately by calling 911 or calling your MD immediately  if symptoms less severe.  You Must read complete instructions/literature along with all the possible adverse reactions/side effects for all the Medicines you take and that have been prescribed to you. Take any new Medicines after you have completely understood and accpet all the possible adverse  reactions/side effects.   Please note  You were cared for by a hospitalist during your hospital stay. If you have any questions about your discharge medications or the care you received while you were in the hospital after you are discharged, you can call the unit and asked to speak with the hospitalist on  call if the hospitalist that took care of you is not available. Once you are discharged, your primary care physician will handle any further medical issues. Please note that NO REFILLS for any discharge medications will be authorized once you are discharged, as it is imperative that you return to your primary care physician (or establish a relationship with a primary care physician if you do not have one) for your aftercare needs so that they can reassess your need for medications and monitor your lab values.    On the day of Discharge:  VITAL SIGNS:   Blood pressure (!) 170/88, pulse 77, temperature 97.8 F (36.6 C), resp. rate 17, height 5\' 9"  (1.753 m), weight 89.9 kg (198 lb 1.6 oz), SpO2 97 %.  PHYSICAL EXAMINATION:    GENERAL:  45 y.o.-year-old patient lying in the bed with no acute distress.  EYES: Pupils equal, round, reactive to light and accommodation. No scleral icterus. Extraocular muscles intact.  HEENT: Head atraumatic, normocephalic. Oropharynx and nasopharynx clear.  NECK:  Supple, no jugular venous distention. No thyroid enlargement, no tenderness.  LUNGS: Normal breath sounds bilaterally, no wheezing, rales,rhonchi or crepitation. No use of accessory muscles of respiration.  CARDIOVASCULAR: S1, S2 normal. No murmurs, rubs, or gallops.  ABDOMEN: Soft, non-tender, non-distended. Bowel sounds present. No organomegaly or mass.  EXTREMITIES: No pedal edema, cyanosis, or clubbing.  NEUROLOGIC: Cranial nerves II through XII are intact. Muscle strength 5/5 in all extremities. Sensation intact. Gait not checked.  PSYCHIATRIC: The patient is alert and oriented x 3.  SKIN: No obvious rash, lesion, or ulcer.   DATA REVIEW:   CBC  Recent Labs Lab 01/24/16 0541  WBC 11.7*  HGB 15.8  HCT 45.5  PLT 251    Chemistries   Recent Labs Lab 01/24/16 0541  NA 137  K 3.9  CL 104  CO2 24  GLUCOSE 136*  BUN 15  CREATININE 1.01  CALCIUM 9.4     Microbiology Results  No  results found for this or any previous visit.  RADIOLOGY:  Dg Chest 2 View  Result Date: 01/23/2016 CLINICAL DATA:  Shortness of breath. EXAM: CHEST  2 VIEW COMPARISON:  01/21/2016 FINDINGS: Midline trachea. Normal heart size and mediastinal contours. No pleural effusion or pneumothorax. Improved left lower lobe airspace disease. No new pulmonary opacity identified. IMPRESSION: Improved left lower lobe pneumonia.  No new findings. Electronically Signed   By: Abigail Miyamoto M.D.   On: 01/23/2016 15:54   Nm Myocar Multi W/spect W/wall Motion / Ef  Result Date: 01/24/2016  There was no ST segment deviation noted during stress.  The study is normal.  This is a low risk study.  The left ventricular ejection fraction is normal (55-65%).      Management plans discussed with the patient, family and they are in agreement.  CODE STATUS:     Code Status Orders        Start     Ordered   01/24/16 0345  Full code  Continuous     01/24/16 0344    Code Status History    Date Active Date Inactive Code Status Order ID Comments User Context  01/24/2016  3:44 AM 01/24/2016  7:45 AM Full Code [id number]  Lance Coon, MD Inpatient      TOTAL TIME TAKING CARE OF THIS PATIENT: 37 minutes.    Gladstone Lighter M.D on 01/24/2016 at 2:54 PM  Between 7am to 6pm - Pager - [phone]  After 6pm go to [email or web address] - [employer] [facility]  Office  [phone]  CC: Primary care physician; No PCP Per Patient   Note: This dictation was prepared with Dragon dictation along with smaller phrase technology. Any transcriptional errors that result from this process are unintentional.

## 2016-01-24 NOTE — Progress Notes (Signed)
Pt. Arrived to floor via stretcher. He transferred himself to bed with stand by assist. Pt. On nitro drip. Pt. Alert and oriented x4. Tele applied, box verified with Beverely Low, CNA. Skin assessed with Crystal, RN, no skin issues observed. Wife at bedside.

## 2016-01-24 NOTE — Progress Notes (Signed)
Pt discharged to home via wc.  Instructions and rx given(Norvasc) to pt.  Questions answered.  No distress.

## 2016-01-24 NOTE — Care Management (Signed)
Patient without insurance or pcp.  He is employed at [employer].  Provided patient with list of local physicians accepting new patient and the ones that have sliding scale.  Provided information for [facility].  Provided coupons from [email or web address] for amlodipine and Zpack.   Will be able to pay for these meds.

## 2016-01-24 NOTE — Consult Note (Signed)
Cardiology Consult    Patient ID: Christopher Harrell MRN: [id number], DOB/AGE: Sep 06, 1970   Admit date: 01/23/2016 Date of Consult: 01/24/2016  Primary Physician: No PCP Per Patient Primary Cardiologist: New - seen by M. Fletcher Anon, MD  Requesting Provider: Claria Dice, MD  Patient Profile    45 y/o ? with a h/o HTN and recent dx of PNA, who was admitted to [facility] on 9/24 secondary to pleuritic back pain, HTN, and development of c/p in the ED.   Past Medical History   Past Medical History:  Diagnosis Date  . Community acquired pneumonia    a. Dx 12/2015  . Essential hypertension   . Tobacco abuse     Past Surgical History:  Procedure Laterality Date  . NO PAST SURGERIES       Allergies  No Known Allergies  History of Present Illness    45 y/o ? with a h/o HTN and tobacco abuse.  He has not prior cardiac history.  He was in his usual state of health until ~ 1 wk ago, when he developed productive cough, congestion, wheezing, and dyspnea.  He presented to the [facility] ED on 9/22 where CXR showed LLL PNA.  He was hypertensive during his ER visit and he was Rx amlodipine in addition to azithromycin, albuterol, and guaifenesin/codeine syrup.  He was d/c'd from the ED.  Over the weekend, he developed pleuritic bilateral back pain - worse with deep breathing and cough.  He denies experiencing c/p @ home.  Due to back pain, he presented back to the ED on 9/24 where he was hypertensive (169/115).  He had not yet filled his Rx for amlodipine.  ECG showed inf TWI (no old for comparison).  CXR showed improved LLL PNA.  While in the ED, he c/o chest tightness - he says as he was being given treatment for his BP (labetalol/IV ntg).  He was admitted for further eval.  Trop has been minimally elevated @ 0.03 x 3.  He continues to have pleuritic back pain but denies chest pain.  Inpatient Medications    . amLODipine  5 mg Oral Daily  . azithromycin  500 mg Oral Daily  . enoxaparin (LOVENOX) injection  40  mg Subcutaneous Q24H  . hydrochlorothiazide  50 mg Oral Daily  . [START ON 01/25/2016] Influenza vac split quadrivalent PF  0.5 mL Intramuscular Tomorrow-1000    Family History    Family History  Problem Relation Age of Onset  . Hypertension Mother   . Hypertension Father   . Heart attack Maternal Grandmother     Social History    Social History   Social History  . Marital status: Married    Spouse name: N/A  . Number of children: N/A  . Years of education: N/A   Occupational History  . Not on file.   Social History Main Topics  . Smoking status: Current Every Day Smoker    Packs/day: 0.50    Types: Cigarettes  . Smokeless tobacco: Never Used  . Alcohol use No  . Drug use: No  . Sexual activity: Not on file   Other Topics Concern  . Not on file   Social History Narrative   Lives locally with his wife.  Does not routinely exercise.     Review of Systems    General:  No chills, fever, night sweats or weight changes.  Cardiovascular:  +++ chest pain in ED, +++ dyspnea on exertion in setting of recent PNA, no edema, orthopnea,  palpitations, paroxysmal nocturnal dyspnea. Dermatological: No rash, lesions/masses Respiratory: +++ cough, +++ dyspnea Urologic: No hematuria, dysuria Abdominal:   +++ nausea, +++ vomiting (in setting of lexiscan myoview this AM), no diarrhea, bright red blood per rectum, melena, or hematemesis Neurologic:  +++ headache.  No visual changes, wkns, changes in mental status. All other systems reviewed and are otherwise negative except as noted above.  Physical Exam    Blood pressure (!) 153/91, pulse 63, temperature 98.3 F (36.8 C), temperature source Oral, resp. rate 16, height 5\' 9"  (1.753 m), weight 198 lb 1.6 oz (89.9 kg), SpO2 94 %.  General: Pleasant, NAD Psych: Flat affect. Neuro: Alert and oriented X 3. Moves all extremities spontaneously. HEENT: Normal  Neck: Supple without bruits or JVD. Lungs:  Resp regular and unlabored,  CTA. Heart: RRR no s3, s4, or murmurs. Abdomen: Soft, non-tender, non-distended, BS + x 4.  Extremities: No clubbing, cyanosis or edema. DP/PT/Radials 2+ and equal bilaterally.  Labs     Recent Labs  01/23/16 1533 01/23/16 1849 01/24/16 0541  TROPONINI 0.03* 0.03* 0.03*   Lab Results  Component Value Date   WBC 11.7 (H) 01/24/2016   HGB 15.8 01/24/2016   HCT 45.5 01/24/2016   MCV 87.3 01/24/2016   PLT 251 01/24/2016    Recent Labs Lab 01/24/16 0541  NA 137  K 3.9  CL 104  CO2 24  BUN 15  CREATININE 1.01  CALCIUM 9.4  GLUCOSE 136*    Radiology Studies    Dg Chest 2 View  Result Date: 01/23/2016 CLINICAL DATA:  Shortness of breath. EXAM: CHEST  2 VIEW COMPARISON:  01/21/2016 FINDINGS: Midline trachea. Normal heart size and mediastinal contours. No pleural effusion or pneumothorax. Improved left lower lobe airspace disease. No new pulmonary opacity identified. IMPRESSION: Improved left lower lobe pneumonia.  No new findings. Electronically Signed   By: Abigail Miyamoto M.D.   On: 01/23/2016 15:54   Dg Chest 2 View  Result Date: 01/21/2016 CLINICAL DATA:  Productive cough, 1 week duration EXAM: CHEST  2 VIEW COMPARISON:  None. FINDINGS: Heart size is normal. Mediastinal shadows are normal. The right lung is clear. There is left lower lobe pneumonia. No lobar collapse. No effusions. No bony abnormality. IMPRESSION: Left lower lobe pneumonia. Electronically Signed   By: Nelson Chimes M.D.   On: 01/21/2016 14:07    ECG & Cardiac Imaging    RSR, 80, LAE, inf TWI - no old for comparison.  Assessment & Plan    1.  Chest Pain/Troponin elevation:  Pt was dx with LLL PNA on 9/22 and rx abx @ the time.  Over the weekend, he developed pleuritic bilateral back pain, prompting him to re-present to the ED on 9/24.  There, ECG showed inf TWI.  Troponin was mildly elevated @ 0.03.  He was hypertensive and developed chest pain while in the ED.  Following admission, he developed a h/a on IV  ntg but has had no further c/p.  He continues to have pleuritic back pain.  Suspect mild trop elevation may represent demand ischemia in the setting of poorly controlled HTN.  Agree with echo and lexiscan myoview (already performed).  Further recs pending results.  2.  Essential HTN: Dx on 9/22  Rx amlodipine by ER staff but he never filled.  BP up this AM but he has a h/a in setting of IV ntg (currently off) and he has not yet received AM doses of amlodipine or HCTZ.  Keep NTG off  given severe headache.  Follow BP after AM meds.  He will need close outpt f/u.  3.  LLL PNA:  Likely the cause of pleuritic back pain.  Cont abx, inhalers.  4.  Tobacco Abuse:  Cessation advised.  Signed, Murray Hodgkins, NP 01/24/2016, 9:58 AM

## 2016-01-24 NOTE — Care Management (Signed)
Placed in observation for elevated troponin, ekg changes and chest pain.  Treated for pneumonia for several days as an outpatient.

## 2016-01-24 NOTE — Progress Notes (Signed)
Hydralazine 10mg  IV given for elevated BP, will continue to monitor.

## 2016-01-24 NOTE — Progress Notes (Signed)
     Christopher Harrell was admitted to the [facility] on 01/23/2016 for an acute medical condition and is being Discharged on  01/24/2016 . He will need another day for recovery and so advised to stay away from work until then. So please excuse him from work for the above  Days. Should be able to return to work without any restrictions from 01/25/16.  Call Gladstone Lighter  MD, [facility] at  [phone] with questions.  Gladstone Lighter M.D on 01/24/2016,at 2:53 PM  [facility] [address] [place] [address]

## 2016-01-24 NOTE — Progress Notes (Signed)
Dr. Marcille Blanco on unit. He was informed of blood pressure  Lowering 147/89 and chest pain stopped and Dr. Marcille Blanco  said to stop nitro drip. Nitro drip stopped. If chest pain resumes drip should be restarted. Will continue to monitor pt.

## 2016-01-24 NOTE — Progress Notes (Signed)
BP stable at this time, no c/o pain, SOB or acute distress noted. Pt. Sleeping peacefully, no coughing observed. Wife continues at beside. Will continue to monitor pt.

## 2016-01-25 LAB — ECHOCARDIOGRAM COMPLETE
Height: 69 in
WEIGHTICAEL: 3169.6 [oz_av]

## 2016-05-30 ENCOUNTER — Ambulatory Visit: Payer: Self-pay | Admitting: Nurse Practitioner

## 2016-05-30 VITALS — BP 201/135 | HR 87 | Temp 98.9°F | Wt 212.0 lb

## 2016-05-30 DIAGNOSIS — R5382 Chronic fatigue, unspecified: Secondary | ICD-10-CM

## 2016-05-30 DIAGNOSIS — I10 Essential (primary) hypertension: Secondary | ICD-10-CM

## 2016-05-30 DIAGNOSIS — R7309 Other abnormal glucose: Secondary | ICD-10-CM

## 2016-05-30 DIAGNOSIS — E782 Mixed hyperlipidemia: Secondary | ICD-10-CM

## 2016-05-30 MED ORDER — AMLODIPINE BESYLATE 10 MG PO TABS: 10.0000 mg | ORAL_TABLET | Freq: Every day | ORAL | 1 refills | Status: DC

## 2016-05-30 MED ORDER — AMOXICILLIN 500 MG PO CAPS
1000.0000 mg | ORAL_CAPSULE | Freq: Two times a day (BID) | ORAL | 1 refills | Status: AC
Start: 2016-05-30 — End: 2016-06-09

## 2016-05-30 MED ORDER — LISINOPRIL-HYDROCHLOROTHIAZIDE 20-12.5 MG PO TABS
1.0000 | ORAL_TABLET | Freq: Every day | ORAL | 4 refills | Status: DC
Start: 2016-05-30 — End: 2017-01-02

## 2016-05-30 NOTE — Progress Notes (Signed)
Has not had any bp meds for one year, huge family history of HTN No known med allergies  No etoh or other sub use  Has taken amlodipine in the past, has taken another med with hctz, name unknown  Smokes 10 cigarettes per day  Anxiety   Works 6 hours per day  Has severe migraine headaches,  With activity feels like his heart is going to beat out of his chest   EXAM:   ALERT, ANXIOUS, IN NAD, VERBALLY APPROPRIATE  LEFT LOWER POSTERIOR IS BROKEN,  CAROTID BRUITS ARE NOT PRESENT POSITIVE FOR BILATERAL SUBMANDIBULAR ADENOPATHY AP RRR NO THYROMEGALY BBrS CLEAR, MILDLY DIMINISHED NO BLE EDEMA, PULSES EASILY PALPABLE AT PT.     PLANS:    IMPERATIVE TO BEGIN HTN MEDS ASAP WILL START AMLODIPINE 10 MG, WHICH HE HAS TAKEN IN THE PAST, WILL TAKE THIS MED ONLY FOR 3-5 DAYS  AND THEN BEGIN LISINOPRIL, 20/12.5, AS WELL.  WILL ARRANGE FOR EYE EXAM, WOULD PREFER TO BE DONE AFTER BP IS REASONABLE  WILL DRAW LABS ASAP  WILL PROVIDE INFO ON DENTAL CLINIC  WILL ASSIST IN OBTAINING MENTAL HEALTH SERVICES IN R/T ANXIETY, HAVE DISCUSSED THAT THE ANXIETY WILL SIGNIFICANTLY DECREASE  WITH JUST CONTROLLING BP.    LIKELY TOOTH WITH PAIN IS EARLY ABSCESS, WILL PRESCRIBE AMOXIL 1000 MG BID FOR 10 DAYS WITH ONE REFILL  WILL SEE PT BACK IN CLINIC IN TWO WEEKS TO REVIEW LABS AND BP RESPONSE

## 2016-06-05 ENCOUNTER — Telehealth: Payer: Self-pay | Admitting: Pharmacist

## 2016-06-05 NOTE — Telephone Encounter (Signed)
Christopher Harrell called today. I spoke with him about his medications and blood pressure. Per the patient, home BP was 175/140 mmHg. He expressed concern regarding symptoms such as dizziness, lightheadedness, headache, blurred vision and weakness. He denies chest pain or tightness. He was seen at [facility] on 05/[facility]/16 and had been out of his blood pressure medications for a year. He was started on Lisinopril 20 mg daily, HCTZ 12.5 mg daily and amlodipine 10 mg daily 05/30/16 (patient picked these up on 06/01/16). Patient states he took a dose of each this past Friday, Saturday and Sunday. Patient states that he has had to miss the last 3 days of work due to the above symptoms. He feels as is his head "will explode". He has taken acetaminophen extra strength; no relief.  Patient was encouraged to take his BP medications today. Discussed the urgency of his blood pressure level (risk of stroke, MI, end-organ damage). Patient voiced understanding. Patient was encouraged to call 911. He prefers to wait until 16:30 to go to the ED, when his wife returns from work.   Christopher Harrell K. Dicky Doe, PharmD [facility] [place] Operations Coordinator [phone]

## 2016-06-08 ENCOUNTER — Ambulatory Visit: Payer: Self-pay | Admitting: Licensed Clinical Social Worker

## 2016-06-12 ENCOUNTER — Ambulatory Visit: Payer: Self-pay

## 2016-06-13 ENCOUNTER — Ambulatory Visit: Payer: Self-pay | Admitting: Family Medicine

## 2016-06-13 ENCOUNTER — Emergency Department: Payer: Self-pay

## 2016-06-13 ENCOUNTER — Other Ambulatory Visit: Payer: Self-pay | Admitting: Family Medicine

## 2016-06-13 ENCOUNTER — Encounter: Payer: Self-pay | Admitting: Emergency Medicine

## 2016-06-13 ENCOUNTER — Emergency Department
Admission: EM | Admit: 2016-06-13 | Discharge: 2016-06-14 | Discharge status: Against Medical Advice | Payer: Self-pay | Attending: Emergency Medicine | Admitting: Emergency Medicine

## 2016-06-13 DIAGNOSIS — F17210 Nicotine dependence, cigarettes, uncomplicated: Secondary | ICD-10-CM | POA: Insufficient documentation

## 2016-06-13 DIAGNOSIS — I160 Hypertensive urgency: Secondary | ICD-10-CM

## 2016-06-13 DIAGNOSIS — Z720 Tobacco use: Secondary | ICD-10-CM | POA: Insufficient documentation

## 2016-06-13 DIAGNOSIS — R079 Chest pain, unspecified: Secondary | ICD-10-CM

## 2016-06-13 DIAGNOSIS — R519 Headache, unspecified: Secondary | ICD-10-CM

## 2016-06-13 DIAGNOSIS — Z79899 Other long term (current) drug therapy: Secondary | ICD-10-CM | POA: Insufficient documentation

## 2016-06-13 DIAGNOSIS — R51 Headache: Secondary | ICD-10-CM | POA: Insufficient documentation

## 2016-06-13 DIAGNOSIS — R0789 Other chest pain: Secondary | ICD-10-CM

## 2016-06-13 LAB — BASIC METABOLIC PANEL
ANION GAP: 8 (ref 5–15)
BUN: 12 mg/dL (ref 6–20)
CO2: 24 mmol/L (ref 22–32)
Calcium: 9.1 mg/dL (ref 8.9–10.3)
Chloride: 106 mmol/L (ref 101–111)
Creatinine, Ser: 0.95 mg/dL (ref 0.61–1.24)
Glucose, Bld: 118 mg/dL — ABNORMAL HIGH (ref 65–99)
Potassium: 3.9 mmol/L (ref 3.5–5.1)
Sodium: 138 mmol/L (ref 135–145)

## 2016-06-13 LAB — CBC
HCT: 45.7 % (ref 40.0–52.0)
HEMOGLOBIN: 15.9 g/dL (ref 13.0–18.0)
MCH: 30.2 pg (ref 26.0–34.0)
MCHC: 34.8 g/dL (ref 32.0–36.0)
MCV: 86.7 fL (ref 80.0–100.0)
Platelets: 213 10*3/uL (ref 150–440)
RBC: 5.28 MIL/uL (ref 4.40–5.90)
RDW: 13.8 % (ref 11.5–14.5)
WBC: 6.4 10*3/uL (ref 3.8–10.6)

## 2016-06-13 LAB — TROPONIN I: TROPONIN I: 0.04 ng/mL — AB (ref <0.03)

## 2016-06-13 MED ORDER — NITROGLYCERIN 0.4 MG SL SUBL
0.4000 mg | SUBLINGUAL_TABLET | SUBLINGUAL | Status: DC | PRN
  Administered 2016-06-13 (×2): 0.4 mg via SUBLINGUAL
  Filled 2016-06-13 (×3): qty 1

## 2016-06-13 MED ORDER — ONDANSETRON HCL 4 MG/2ML IJ SOLN
4.0000 mg | Freq: Once | INTRAMUSCULAR | Status: AC
  Administered 2016-06-13: 4 mg via INTRAVENOUS
  Filled 2016-06-13: qty 2

## 2016-06-13 MED ORDER — MORPHINE SULFATE (PF) 4 MG/ML IV SOLN
4.0000 mg | Freq: Once | INTRAVENOUS | Status: AC
  Administered 2016-06-13: 4 mg via INTRAVENOUS
  Filled 2016-06-13: qty 1

## 2016-06-13 NOTE — Progress Notes (Signed)
BP (!) 211/136   Pulse 85   Temp 98.2 F (36.8 C)   Wt 213 lb (96.6 kg)   BMI 31.45 kg/m        Subjective:        Patient ID: Christopher Harrell, male    DOB: July 21, 1970, 46 y.o.   MRN: [id number]     HPI:  Christopher Harrell is a 46 y.o. male          Chief Complaint    Patient presents with    .   Headache            migraine    .   Palpitations       Patient is here for labs only; however, he was found to have really high blood pressure  He is having a headache, severe; no facial weakness, no tingling or numbness  He is having chest tightness, pain across the front of the chest with some nausea  He took his medicines earlier today; no NSAIDs; no decongestants  He has known hypertension; had really high pressure the last visit; had meds adjusted at last visit here at [facility]  He has had a headache now for 3 weeks, severe, "migraine"  Hx of LLL pneumonia; chest xray from Sept 2017 ER visit does not show any LVH  ER visit in January to [facility]; trop I negative x 2; chest CTA negative for dissection; head CT negative     Relevant past medical, surgical, family and social history reviewed       Past Medical History:    Diagnosis   Date    .   Community acquired pneumonia            a. Dx 01/21/2016 - LLL PNA.    .   Essential hypertension        .   Tobacco abuse                 Past Surgical History:    Procedure   Laterality   Date    .   NO PAST SURGERIES                     Family History    Problem   Relation   Age of Onset    .   Hypertension   Mother        .   Hypertension   Father        .   Heart attack   Maternal Grandmother                 Social History    Substance Use Topics    .   Smoking status:   Current Every Day Smoker            Packs/day:   0.50            Types:   Cigarettes    .   Smokeless  tobacco:   Never Used    .   Alcohol use   No          Interim medical history since last visit reviewed.  Allergies and medications reviewed     Review of Systems  Per HPI unless specifically indicated above          Objective:      BP (!) 211/136   Pulse 85   Temp 98.2 F (36.8 C)   Wt 213 lb (96.6 kg)  BMI 31.45 kg/m        Wt Readings from Last 3 Encounters:    06/13/16   213 lb (96.6 kg)    05/30/16   212 lb (96.2 kg)    01/24/16   198 lb 1.6 oz (89.9 kg)             Today's Vitals        06/13/16 1834   06/13/16 1853   06/13/16 1855    BP:   (!) 205/141   (!) 196/139   (!) 211/136    Pulse:   83       85    Temp:   98.2 F (36.8 C)            Weight:   213 lb (96.6 kg)            PainSc:   9             PainLoc:   Head               Physical Exam   Constitutional: He appears well-developed and well-nourished. He appears distressed (appears to not feel well).   HENT:   Head: Normocephalic and atraumatic.   Cardiovascular: Normal rate and regular rhythm.   No extrasystoles are present.   Pulmonary/Chest: Effort normal and breath sounds normal. He has no wheezes.   Abdominal: Soft. Normal appearance. He exhibits no abdominal bruit.  Musculoskeletal: He exhibits no edema.  Neurological: He displays no tremor. No cranial nerve deficit.   Skin: He is not diaphoretic.  Psychiatric: He has a normal mood and affect. His speech is normal and behavior is normal. Judgment and thought content normal. Cognition and memory are normal.            Results for orders placed or performed during the hospital encounter of 01/23/16    NM Myocar Multi W/Spect W/Wall Motion / EF    Result   Value   Ref Range        Rest HR   65   bpm        Rest BP   159/99   mmHg        Exercise duration (sec)   0   sec        Percent HR   59   %         Exercise duration (min)   0   min        Estimated workload   1.0   METS        Peak HR   103   bpm        Peak BP   163/92   mmHg        MPHR   174   bpm        SSS   0            SRS   0            SDS   0            TID   1.14            LV sys vol   73   mL        LV dias vol   147   62 - 150 mL    Basic metabolic panel    Result   Value   Ref Range        Sodium  140   135 - 145 mmol/L        Potassium   3.5   3.5 - 5.1 mmol/L        Chloride   106   101 - 111 mmol/L        CO2   27   22 - 32 mmol/L        Glucose, Bld   124 (H)   65 - 99 mg/dL        BUN   13   6 - 20 mg/dL        Creatinine, Ser   1.00   0.61 - 1.24 mg/dL        Calcium   9.5   8.9 - 10.3 mg/dL        GFR calc non Af Amer   >60   >60 mL/min        GFR calc Af Amer   >60   >60 mL/min        Anion gap   7   5 - 15    CBC    Result   Value   Ref Range        WBC   6.9   3.8 - 10.6 K/uL        RBC   5.43   4.40 - 5.90 MIL/uL        Hemoglobin   16.7   13.0 - 18.0 g/dL        HCT   47.6   40.0 - 52.0 %        MCV   87.6   80.0 - 100.0 fL        MCH   30.7   26.0 - 34.0 pg        MCHC   35.1   32.0 - 36.0 g/dL        RDW   13.9   11.5 - 14.5 %        Platelets   256   150 - 440 K/uL    Troponin I    Result   Value   Ref Range        Troponin I   0.03 (HH)   <0.03 ng/mL    Troponin I    Result   Value   Ref Range        Troponin I   0.03 (HH)   <0.03 ng/mL    Basic metabolic panel    Result   Value   Ref Range        Sodium   137   135 - 145 mmol/L        Potassium   3.9   3.5 - 5.1 mmol/L        Chloride   104   101 - 111 mmol/L        CO2   24   22 - 32 mmol/L         Glucose, Bld   136 (H)   65 - 99 mg/dL        BUN   15   6 - 20 mg/dL        Creatinine, Ser   1.01   0.61 - 1.24 mg/dL        Calcium   9.4   8.9 - 10.3 mg/dL        GFR calc non Af Amer   >60   >60 mL/min  GFR calc Af Amer   >60   >60 mL/min        Anion gap   9   5 - 15    CBC    Result   Value   Ref Range        WBC   11.7 (H)   3.8 - 10.6 K/uL        RBC   5.21   4.40 - 5.90 MIL/uL        Hemoglobin   15.8   13.0 - 18.0 g/dL        HCT   45.5   40.0 - 52.0 %        MCV   87.3   80.0 - 100.0 fL        MCH   30.3   26.0 - 34.0 pg        MCHC   34.7   32.0 - 36.0 g/dL        RDW   14.1   11.5 - 14.5 %        Platelets   251   150 - 440 K/uL    Troponin I    Result   Value   Ref Range        Troponin I   0.03 (HH)   <0.03 ng/mL    Echocardiogram    Result   Value   Ref Range        Weight   3,169.6   oz        Height   69   in        BP   170/95   mmHg            Assessment & Plan:           Problem List Items Addressed This Visit                  Cardiovascular and Mediastinum        Hypertensive urgency, malignant              Called 911; 12 lead showed T wave inversion inferior leads, two of those three similar to previous; less than 1 mm ST segment elevation anteriorly; patient transported to ER via EMS in stable condition; before he left and while waiting on ambulance, discussed avoidance of decongestants, smoking cessation, and DASH guidelines; will refer to nephrologist; the fact that he did not have LVH on CXR several months ago suggests this may be more recent onset, search for causes of hypertension worthwhile; no abdominal bruit heard; refer to nephrologist              Relevant Orders        Ambulatory referral to Nephrology              Other        Tobacco abuse              Encouraged smoking cessation              Chest pain              In midst of hypertensive urgency; 911 called; patient transported to ER                      Follow up plan:  No Follow-up on file.     An after-visit summary was printed and given to the patient at [place].  Please see  the patient instructions which may contain other information and recommendations beyond what is mentioned above in the assessment and plan.     No orders of the defined types were placed in this encounter.            Orders Placed This Encounter    Procedures    .   Ambulatory referral to Nephrology       Face-to-face time with patient was more than 25 minutes, >50% time spent counseling and coordination of care                                BP (!) 211/136   Pulse 85   Temp 98.2 F (36.8 C)   Wt 213 lb (96.6 kg)   BMI 31.45 kg/m        Subjective:        Patient ID: Christopher Harrell, male    DOB: Feb 13, 1971, 46 y.o.   MRN: [id number]     HPI:  Christopher Harrell is a 46 y.o. male          Chief Complaint    Patient presents with    .   Headache            migraine    .   Palpitations       Patient is here for labs only; however, he was found to have really high blood pressure  He is having a headache, severe; no facial weakness, no tingling or numbness  He is having chest tightness, pain across the front of the chest with some nausea  He took his medicines earlier today; no NSAIDs; no decongestants  He has known hypertension; had really high pressure the last visit; had meds adjusted at last visit here at [facility]  He has had a headache now for 3 weeks, severe, "migraine"  Hx of LLL pneumonia; chest xray from Sept 2017 ER visit does not show any LVH  ER visit in January to [facility]; trop I negative x 2; chest CTA negative for  dissection; head CT negative     Relevant past medical, surgical, family and social history reviewed       Past Medical History:    Diagnosis   Date    .   Community acquired pneumonia            a. Dx 01/21/2016 - LLL PNA.    .   Essential hypertension        .   Tobacco abuse                 Past Surgical History:    Procedure   Laterality   Date    .   NO PAST SURGERIES                     Family History    Problem   Relation   Age of Onset    .   Hypertension   Mother        .   Hypertension   Father        .   Heart attack   Maternal Grandmother                 Social History    Substance Use Topics    .   Smoking status:   Current Every Day Smoker  Packs/day:   0.50            Types:   Cigarettes    .   Smokeless tobacco:   Never Used    .   Alcohol use   No          Interim medical history since last visit reviewed.  Allergies and medications reviewed     Review of Systems  Per HPI unless specifically indicated above          Objective:      BP (!) 211/136   Pulse 85   Temp 98.2 F (36.8 C)   Wt 213 lb (96.6 kg)   BMI 31.45 kg/m        Wt Readings from Last 3 Encounters:    06/13/16   213 lb (96.6 kg)    05/30/16   212 lb (96.2 kg)    01/24/16   198 lb 1.6 oz (89.9 kg)             Today's Vitals        06/13/16 1834   06/13/16 1853   06/13/16 1855    BP:   (!) 205/141   (!) 196/139   (!) 211/136    Pulse:   83       85    Temp:   98.2 F (36.8 C)            Weight:   213 lb (96.6 kg)            PainSc:   9             PainLoc:   Head               Physical Exam   Constitutional: He appears well-developed and well-nourished. He appears distressed (appears to not feel well).   HENT:   Head: Normocephalic and atraumatic.    Cardiovascular: Normal rate and regular rhythm.   No extrasystoles are present.   Pulmonary/Chest: Effort normal and breath sounds normal. He has no wheezes.   Abdominal: Soft. Normal appearance. He exhibits no abdominal bruit.  Musculoskeletal: He exhibits no edema.  Neurological: He displays no tremor. No cranial nerve deficit.   Skin: He is not diaphoretic.  Psychiatric: He has a normal mood and affect. His speech is normal and behavior is normal. Judgment and thought content normal. Cognition and memory are normal.            Results for orders placed or performed during the hospital encounter of 01/23/16    NM Myocar Multi W/Spect W/Wall Motion / EF    Result   Value   Ref Range        Rest HR   65   bpm        Rest BP   159/99   mmHg        Exercise duration (sec)   0   sec        Percent HR   59   %        Exercise duration (min)   0   min        Estimated workload   1.0   METS        Peak HR   103   bpm        Peak BP   163/92   mmHg        MPHR   174   bpm  SSS   0            SRS   0            SDS   0            TID   1.14            LV sys vol   73   mL        LV dias vol   147   62 - 150 mL    Basic metabolic panel    Result   Value   Ref Range        Sodium   140   135 - 145 mmol/L        Potassium   3.5   3.5 - 5.1 mmol/L        Chloride   106   101 - 111 mmol/L        CO2   27   22 - 32 mmol/L        Glucose, Bld   124 (H)   65 - 99 mg/dL        BUN   13   6 - 20 mg/dL        Creatinine, Ser   1.00   0.61 - 1.24 mg/dL        Calcium   9.5   8.9 - 10.3 mg/dL        GFR calc non Af Amer   >60   >60 mL/min        GFR calc Af Amer   >60   >60 mL/min        Anion gap   7   5 - 15    CBC    Result   Value   Ref Range         WBC   6.9   3.8 - 10.6 K/uL        RBC   5.43   4.40 - 5.90 MIL/uL        Hemoglobin   16.7   13.0 - 18.0 g/dL        HCT   47.6   40.0 - 52.0 %        MCV   87.6   80.0 - 100.0 fL        MCH   30.7   26.0 - 34.0 pg        MCHC   35.1   32.0 - 36.0 g/dL        RDW   13.9   11.5 - 14.5 %        Platelets   256   150 - 440 K/uL    Troponin I    Result   Value   Ref Range        Troponin I   0.03 (HH)   <0.03 ng/mL    Troponin I    Result   Value   Ref Range        Troponin I   0.03 (HH)   <0.03 ng/mL    Basic metabolic panel    Result   Value   Ref Range        Sodium   137   135 - 145 mmol/L        Potassium   3.9   3.5 - 5.1 mmol/L        Chloride   104   101 - 111  mmol/L        CO2   24   22 - 32 mmol/L        Glucose, Bld   136 (H)   65 - 99 mg/dL        BUN   15   6 - 20 mg/dL        Creatinine, Ser   1.01   0.61 - 1.24 mg/dL        Calcium   9.4   8.9 - 10.3 mg/dL        GFR calc non Af Amer   >60   >60 mL/min        GFR calc Af Amer   >60   >60 mL/min        Anion gap   9   5 - 15    CBC    Result   Value   Ref Range        WBC   11.7 (H)   3.8 - 10.6 K/uL        RBC   5.21   4.40 - 5.90 MIL/uL        Hemoglobin   15.8   13.0 - 18.0 g/dL        HCT   45.5   40.0 - 52.0 %        MCV   87.3   80.0 - 100.0 fL        MCH   30.3   26.0 - 34.0 pg        MCHC   34.7   32.0 - 36.0 g/dL        RDW   14.1   11.5 - 14.5 %        Platelets   251   150 - 440 K/uL    Troponin I    Result   Value   Ref Range        Troponin I   0.03 (HH)   <0.03 ng/mL    Echocardiogram    Result   Value   Ref Range        Weight   3,169.6   oz        Height   69   in        BP    170/95   mmHg            Assessment & Plan:           Problem List Items Addressed This Visit                  Cardiovascular and Mediastinum        Hypertensive urgency, malignant              Called 911; 12 lead showed T wave inversion inferior leads, two of those three similar to previous; less than 1 mm ST segment elevation anteriorly; patient transported to ER via EMS in stable condition; before he left and while waiting on ambulance, discussed avoidance of decongestants, smoking cessation, and DASH guidelines; will refer to nephrologist; the fact that he did not have LVH on CXR several months ago suggests this may be more recent onset, search for causes of hypertension worthwhile; no abdominal bruit heard; refer to nephrologist              Relevant Orders        Ambulatory referral to Nephrology  Other        Tobacco abuse              Encouraged smoking cessation              Chest pain              In midst of hypertensive urgency; 911 called; patient transported to ER                      Follow up plan:  No Follow-up on file.     An after-visit summary was printed and given to the patient at [facility].  Please see the patient instructions which may contain other information and recommendations beyond what is mentioned above in the assessment and plan.     No orders of the defined types were placed in this encounter.            Orders Placed This Encounter    Procedures    .   Ambulatory referral to Nephrology       Face-to-face time with patient was more than 25 minutes, >50% time spent counseling and coordination of care

## 2016-06-13 NOTE — ED Notes (Signed)
ED Provider at bedside. 

## 2016-06-13 NOTE — Assessment & Plan Note (Signed)
See note

## 2016-06-13 NOTE — Assessment & Plan Note (Signed)
Called 911; 12 lead showed T wave inversion inferior leads, two of those three similar to previous; less than 1 mm ST segment elevation anteriorly; patient transported to ER via EMS in stable condition; before he left and while waiting on ambulance, discussed avoidance of decongestants, smoking cessation, and DASH guidelines; will refer to nephrologist; the fact that he did not have LVH on CXR several months ago suggests this may be more recent onset, search for causes of hypertension worthwhile; no abdominal bruit heard; refer to nephrologist

## 2016-06-13 NOTE — ED Triage Notes (Addendum)
Patient comes in via EMS from the [facility] with chest pain to the central chest and high blood pressure. patiet rated the chest pain 8.5/10 without radiation. Patient reports being out of his blood pressure medication and just restarting about 2 weeks ago. Patient also reports having a headache and some dizziness. EMS applied nitro paste 1 in to left chest. Patient appears to be fatigued and not feeling well.

## 2016-06-13 NOTE — ED Provider Notes (Signed)
[facility] Emergency Department Provider Note ____________________________________________   I have reviewed the triage vital signs and the triage nursing note.  HISTORY  Chief Complaint Chest Pain and Hypertension   Historian Patient  HPI Christopher Harrell is a 46 y.o. male presents here with his wife after being at [facility] where he developed central chest pain and pressure as well as headache and lightheadedness. He states that he has had these symptoms on and off for weeks and weeks now. States that his blood pressure had been severely elevated and was recently placed on lisinopril and hydrochlorothiazide about 3 weeks ago. [facility] is [facility] when he developed chest pain and headache. History states that his blood pressures are last few days have been in the 180s over 110 range.  Mild nausea without vomiting. No recent cough congestion or fevers.  Chest pain is moderate to severe as this is headache.    Past Medical History:  Diagnosis Date  . Community acquired pneumonia    a. Dx 01/21/2016 - LLL PNA.  . Essential hypertension   . Tobacco abuse     Patient Active Problem List   Diagnosis Date Noted  . Hypertensive urgency, malignant 06/13/2016  . Tobacco abuse 06/13/2016  . Elevated troponin 01/23/2016  . HTN (hypertension) 01/23/2016  . Chest pain 01/23/2016    Past Surgical History:  Procedure Laterality Date  . NO PAST SURGERIES      Prior to Admission medications   Medication Sig Start Date End Date Taking? Authorizing Provider  albuterol (PROVENTIL HFA;VENTOLIN HFA) 108 (90 Base) MCG/ACT inhaler Inhale 2 puffs into the lungs every 6 (six) hours as needed for wheezing or shortness of breath. Patient not taking: Reported on 06/13/2016 01/21/16   Dannielle Karvonen Menshew, PA-C  amLODipine (NORVASC) 10 MG tablet Take 1 tablet (10 mg total) by mouth  daily. 05/30/16 05/30/17  Boyce Medici, FNP  lisinopril-hydrochlorothiazide (PRINZIDE,ZESTORETIC) 20-12.5 MG tablet Take 1 tablet by mouth daily. 05/30/16   Boyce Medici, FNP    No Known Allergies  Family History  Problem Relation Age of Onset  . Hypertension Mother   . Hypertension Father   . Heart attack Maternal Grandmother     Social History Social History  Substance Use Topics  . Smoking status: Current Every Day Smoker    Packs/day: 0.50    Types: Cigarettes  . Smokeless tobacco: Never Used  . Alcohol use No    Review of Systems  Constitutional: Negative for fever. Eyes: Negative for visual changes. ENT: Negative for sore throat. Cardiovascular: Operative for chest pain. Respiratory: Negative for shortness of breath. Gastrointestinal: Negative for abdominal pain, vomiting and diarrhea. Genitourinary: Negative for dysuria. Musculoskeletal: Negative for back pain. Skin: Negative for rash. Neurological: Positive for headache. 10 point Review of Systems otherwise negative ____________________________________________   PHYSICAL EXAM:  VITAL SIGNS: ED Triage Vitals  Enc Vitals Group     BP 06/13/16 2001 (!) 211/142     Pulse Rate 06/13/16 2012 75     Resp 06/13/16 2012 18     Temp --      Temp src --      SpO2 06/13/16 2012 98 %     Weight 06/13/16 2014 212 lb (96.2 kg)     Height 06/13/16 2014 5\' 9"  (1.753 m)     Head Circumference --      Peak Flow --  Pain Score 06/13/16 2015 8     Pain Loc --      Pain Edu? --      Excl. in [place]? --      Constitutional: Alert and oriented. Well appearing Overall and in no acute distress, but is complaining of pain. HEENT   Head: Normocephalic and atraumatic.      Eyes: Conjunctivae are normal. PERRL. Normal extraocular movements.      Ears:         Nose: No congestion/rhinnorhea.   Mouth/Throat: Mucous membranes are moist.   Neck: No stridor. Cardiovascular/Chest: Normal rate, regular rhythm.  No  murmurs, rubs, or gallops. Respiratory: Normal respiratory effort without tachypnea nor retractions. Breath sounds are clear and equal bilaterally. No wheezes/rales/rhonchi. Gastrointestinal: Soft. No distention, no guarding, no rebound. Nontender.    Genitourinary/rectal:Deferred Musculoskeletal: Nontender with normal range of motion in all extremities. No joint effusions.  No lower extremity tenderness.  No edema. Neurologic:  Normal speech and language. No gross or focal neurologic deficits are appreciated. Skin:  Skin is warm, dry and intact. No rash noted. Psychiatric: Mood and affect are normal. Speech and behavior are normal. Patient exhibits appropriate insight and judgment.   ____________________________________________  LABS (pertinent positives/negatives)  Labs Reviewed  BASIC METABOLIC PANEL - Abnormal; Notable for the following:       Result Value   Glucose, Bld 118 (*)    All other components within normal limits  TROPONIN I - Abnormal; Notable for the following:    Troponin I 0.04 (*)    All other components within normal limits  CBC    ____________________________________________    EKG I, Lisa Roca, MD, the attending physician have personally viewed and interpreted all ECGs.  76 bpm. Normal sinus rhythm. Narrow distress. Normal axis. T waves inverted inferiorly and nonspecific T waves laterally. When compared with prior EKGs, similar appearance. ____________________________________________  RADIOLOGY All Xrays were viewed by me. Imaging interpreted by Radiologist.  Head CT without contrast:  IMPRESSION: Minimal periventricular small vessel disease. No intracranial mass, hemorrhage, or extra-axial fluid collection. Inferior mastoid air cell disease on the right. Slight mucosal thickening in several ethmoid air cells.  Chest x-ray portable:  IMPRESSION: No edema or consolidation. __________________________________________  PROCEDURES  Procedure(s)  performed: None  Critical Care performed: CRITICAL CARE Performed by: Lisa Roca   Total critical care time: 30 minutes  Critical care time was exclusive of separately billable procedures and treating other patients.  Critical care was necessary to treat or prevent imminent or life-threatening deterioration.  Critical care was time spent personally by me on the following activities: development of treatment plan with patient and/or surrogate as well as nursing, discussions with consultants, evaluation of patient's response to treatment, examination of patient, obtaining history from patient or surrogate, ordering and performing treatments and interventions, ordering and review of laboratory studies, ordering and review of radiographic studies, pulse oximetry and re-evaluation of patient's condition.   ____________________________________________   ED COURSE / ASSESSMENT AND PLAN  Pertinent labs & imaging results that were available during my care of the patient were reviewed by me and considered in my medical decision making (see chart for details).   Christopher Harrell came by EMS after receiving aspirin and Nitropaste to his chest wall due to chest pain that started within the hour prior to arrival. He has a history of chronic elevated high blood pressures, and today is significantly elevated. He is also complaining of headache. It's unclear to me whether or not  chest pain and headache are the cause of the high blood pressure, or the other way around, with blood pressure inducing these symptoms.  Out of concern not drop blood pressure precipitously, I am treating for chest cane complaint initially with nitroglycerin. After 2 swivel nitros, no change in blood pressure, no chest pain, and now he's having a worsening left-sided headache. Morphine and Zofran IV were given neck. I will check a head CT although he does not have any new neurologic deficits.  His EKG shows no changes from prior  EKG.   Diastolic come down from the 130s into the 110 range. At reexamination at 1145, patient has resolution of headache and chest pain and is requesting to leave/discharge. I discussed his abnormal troponin, and recommended continued ED treatment of his blood pressure, and repeat troponin and or hospital admission overnight for hypertensive urgency and chest pain. Patient understands the risks and he is alert and oriented and capable of making his own decisions. His mother-in-law and significant other are here and they support his decision to sign himself out [place].  He is not 100% sure of his dosing, but thinks she takes lisinopril 20 mg or 40 mg. He thinks he takes 12.5 mg hctz. I discussed with him that he might double his dose of hydrochlorothiazide.    CONSULTATIONS:   None   Patient / Family / Caregiver informed of clinical course, medical decision-making process, and agree with plan.   I discussed return precautions, follow-up instructions, and discharge instructions with patient and/or family.   ___________________________________________   FINAL CLINICAL IMPRESSION(S) / ED DIAGNOSES   Final diagnoses:  Hypertensive urgency  Nonspecific chest pain  Acute nonintractable headache, unspecified headache type              Note: This dictation was prepared with Dragon dictation. Any transcriptional errors that result from this process are unintentional    Lisa Roca, MD 06/13/16 2352

## 2016-06-13 NOTE — ED Notes (Signed)
Nitro paste removed per EDP.

## 2016-06-13 NOTE — Assessment & Plan Note (Signed)
Encouraged smoking cessation 

## 2016-06-13 NOTE — Discharge Instructions (Signed)
You were evaluated for chest pain and headache and severely high blood pressure. As we discussed, your heart enzyme was borderline elevated, and I recommended that you stay in the emergency department for further blood pressure treatment and repeat heart enzyme to evaluate for heart attack, or be admitted overnight, and you chose to be discharged from the emergency department. Next line next line we discussed that your evaluation for heart attack and treatment for blood pressure is not complete, and you run the risk of worsening health condition including but not limited to missed heart attack, stroke, and death.  We discussed, return to the emergency department immediately for any new or worsening chest pain, sweats, weakness, numbness, or altered mental status, or any other symptoms concerning to you. If you take your blood pressure at home and the top number is higher than 220, or the bottom number is higher than 110, you may take a second tablet of your hydrochlorothiazide. Maximum dosing would be 100 mg twice a day.

## 2016-06-13 NOTE — ED Notes (Signed)
Patient transported to X-ray 

## 2016-06-13 NOTE — ED Notes (Addendum)
Patient has had a CT of head per EMS which was negative. Patient has a hx of migraine like headaches.

## 2016-06-13 NOTE — Progress Notes (Signed)
BP (!) 211/136   Pulse 85   Temp 98.2 F (36.8 C)   Wt 213 lb (96.6 kg)   BMI 31.45 kg/m    Subjective:    Patient ID: Christopher Harrell, male    DOB: 11-Nov-1970, 46 y.o.   MRN: [id number]  HPI: Christopher Harrell is a 46 y.o. male  Chief Complaint  Patient presents with  . Headache    migraine  . Palpitations   Patient is here for labs only; however, he was found to have really high blood pressure He is having a headache, severe; no facial weakness, no tingling or numbness He is having chest tightness, pain across the front of the chest with some nausea He took his medicines earlier today; no NSAIDs; no decongestants He has known hypertension; had really high pressure the last visit; had meds adjusted at last visit here at [facility] He has had a headache now for 3 weeks, severe, "migraine" Hx of LLL pneumonia; chest xray from Sept 2017 ER visit does not show any LVH ER visit in January to [facility]; trop I negative x 2; chest CTA negative for dissection; head CT negative  Relevant past medical, surgical, family and social history reviewed Past Medical History:  Diagnosis Date  . Community acquired pneumonia    a. Dx 01/21/2016 - LLL PNA.  . Essential hypertension   . Tobacco abuse    Past Surgical History:  Procedure Laterality Date  . NO PAST SURGERIES     Family History  Problem Relation Age of Onset  . Hypertension Mother   . Hypertension Father   . Heart attack Maternal Grandmother    Social History  Substance Use Topics  . Smoking status: Current Every Day Smoker    Packs/day: 0.50    Types: Cigarettes  . Smokeless tobacco: Never Used  . Alcohol use No    Interim medical history since last visit reviewed. Allergies and medications reviewed  Review of Systems Per HPI unless specifically indicated above     Objective:    BP (!) 211/136   Pulse 85   Temp 98.2 F (36.8 C)   Wt 213 lb (96.6 kg)   BMI 31.45 kg/m   Wt Readings from Last 3 Encounters:   06/13/16 213 lb (96.6 kg)  05/30/16 212 lb (96.2 kg)  01/24/16 198 lb 1.6 oz (89.9 kg)    Today's Vitals   06/13/16 1834 06/13/16 1853 06/13/16 1855  BP: (!) 205/141 (!) 196/139 (!) 211/136  Pulse: 83  85  Temp: 98.2 F (36.8 C)    Weight: 213 lb (96.6 kg)    PainSc: 9     PainLoc: Head     Physical Exam  Constitutional: He appears well-developed and well-nourished. He appears distressed (appears to not feel well).  HENT:  Head: Normocephalic and atraumatic.  Cardiovascular: Normal rate and regular rhythm.   No extrasystoles are present.  Pulmonary/Chest: Effort normal and breath sounds normal. He has no wheezes.  Abdominal: Soft. Normal appearance. He exhibits no abdominal bruit.  Musculoskeletal: He exhibits no edema.  Neurological: He displays no tremor. No cranial nerve deficit.  Skin: He is not diaphoretic.  Psychiatric: He has a normal mood and affect. His speech is normal and behavior is normal. Judgment and thought content normal. Cognition and memory are normal.   Results for orders placed or performed during the hospital encounter of 01/23/16  NM Myocar Multi W/Spect W/Wall Motion / EF  Result Value Ref Range   Rest  HR 65 bpm   Rest BP 159/99 mmHg   Exercise duration (sec) 0 sec   Percent HR 59 %   Exercise duration (min) 0 min   Estimated workload 1.0 METS   Peak HR 103 bpm   Peak BP 163/92 mmHg   MPHR 174 bpm   SSS 0    SRS 0    SDS 0    TID 1.14    LV sys vol 73 mL   LV dias vol 147 62 - 150 mL  Basic metabolic panel  Result Value Ref Range   Sodium 140 135 - 145 mmol/L   Potassium 3.5 3.5 - 5.1 mmol/L   Chloride 106 101 - 111 mmol/L   CO2 27 22 - 32 mmol/L   Glucose, Bld 124 (H) 65 - 99 mg/dL   BUN 13 6 - 20 mg/dL   Creatinine, Ser 1.00 0.61 - 1.24 mg/dL   Calcium 9.5 8.9 - 10.3 mg/dL   GFR calc non Af Amer >60 >60 mL/min   GFR calc Af Amer >60 >60 mL/min   Anion gap 7 5 - 15  CBC  Result Value Ref Range   WBC 6.9 3.8 - 10.6 K/uL   RBC 5.43  4.40 - 5.90 MIL/uL   Hemoglobin 16.7 13.0 - 18.0 g/dL   HCT 47.6 40.0 - 52.0 %   MCV 87.6 80.0 - 100.0 fL   MCH 30.7 26.0 - 34.0 pg   MCHC 35.1 32.0 - 36.0 g/dL   RDW 13.9 11.5 - 14.5 %   Platelets 256 150 - 440 K/uL  Troponin I  Result Value Ref Range   Troponin I 0.03 (HH) <0.03 ng/mL  Troponin I  Result Value Ref Range   Troponin I 0.03 (HH) <0.03 ng/mL  Basic metabolic panel  Result Value Ref Range   Sodium 137 135 - 145 mmol/L   Potassium 3.9 3.5 - 5.1 mmol/L   Chloride 104 101 - 111 mmol/L   CO2 24 22 - 32 mmol/L   Glucose, Bld 136 (H) 65 - 99 mg/dL   BUN 15 6 - 20 mg/dL   Creatinine, Ser 1.01 0.61 - 1.24 mg/dL   Calcium 9.4 8.9 - 10.3 mg/dL   GFR calc non Af Amer >60 >60 mL/min   GFR calc Af Amer >60 >60 mL/min   Anion gap 9 5 - 15  CBC  Result Value Ref Range   WBC 11.7 (H) 3.8 - 10.6 K/uL   RBC 5.21 4.40 - 5.90 MIL/uL   Hemoglobin 15.8 13.0 - 18.0 g/dL   HCT 45.5 40.0 - 52.0 %   MCV 87.3 80.0 - 100.0 fL   MCH 30.3 26.0 - 34.0 pg   MCHC 34.7 32.0 - 36.0 g/dL   RDW 14.1 11.5 - 14.5 %   Platelets 251 150 - 440 K/uL  Troponin I  Result Value Ref Range   Troponin I 0.03 (HH) <0.03 ng/mL  Echocardiogram  Result Value Ref Range   Weight 3,169.6 oz   Height 69 in   BP 170/95 mmHg      Assessment & Plan:   Problem List Items Addressed This Visit      Cardiovascular and Mediastinum   Hypertensive urgency, malignant    Called 911; 12 lead showed T wave inversion inferior leads, two of those three similar to previous; less than 1 mm ST segment elevation anteriorly; patient transported to ER via EMS in stable condition; before he left and while waiting on ambulance, discussed  avoidance of decongestants, smoking cessation, and DASH guidelines; will refer to nephrologist; the fact that he did not have LVH on CXR several months ago suggests this may be more recent onset, search for causes of hypertension worthwhile; no abdominal bruit heard; refer to nephrologist       Relevant Orders   Ambulatory referral to Nephrology     Other   Tobacco abuse    Encouraged smoking cessation      Chest pain    In midst of hypertensive urgency; 911 called; patient transported to ER          Follow up plan: No Follow-up on file.  An after-visit summary was printed and given to the patient at [facility].  Please see the patient instructions which may contain other information and recommendations beyond what is mentioned above in the assessment and plan.  No orders of the defined types were placed in this encounter.   Orders Placed This Encounter  Procedures  . Ambulatory referral to Nephrology   Face-to-face time with patient was more than 25 minutes, >50% time spent counseling and coordination of care

## 2016-06-13 NOTE — Assessment & Plan Note (Signed)
In midst of hypertensive urgency; 911 called; patient transported to ER

## 2016-06-14 ENCOUNTER — Other Ambulatory Visit: Payer: Self-pay

## 2016-06-14 LAB — COMPREHENSIVE METABOLIC PANEL
A/G RATIO: 1.6 (ref 1.2–2.2)
ALK PHOS: 67 IU/L (ref 39–117)
ALT: 25 IU/L (ref 0–44)
AST: 16 IU/L (ref 0–40)
Albumin: 4.4 g/dL (ref 3.5–5.5)
BILIRUBIN TOTAL: 0.7 mg/dL (ref 0.0–1.2)
BUN/Creatinine Ratio: 11 (ref 9–20)
BUN: 10 mg/dL (ref 6–24)
CHLORIDE: 98 mmol/L (ref 96–106)
CO2: 28 mmol/L (ref 18–29)
Calcium: 9.6 mg/dL (ref 8.7–10.2)
Creatinine, Ser: 0.93 mg/dL (ref 0.76–1.27)
GFR calc Af Amer: 113 mL/min/{1.73_m2} (ref >59)
GFR calc non Af Amer: 97 mL/min/{1.73_m2} (ref >59)
GLOBULIN, TOTAL: 2.7 g/dL (ref 1.5–4.5)
Glucose: 104 mg/dL — ABNORMAL HIGH (ref 65–99)
POTASSIUM: 4.3 mmol/L (ref 3.5–5.2)
SODIUM: 142 mmol/L (ref 134–144)
Total Protein: 7.1 g/dL (ref 6.0–8.5)

## 2016-06-14 LAB — CBC WITH DIFFERENTIAL
BASOS: 1 %
Basophils Absolute: 0 10*3/uL (ref 0.0–0.2)
EOS (ABSOLUTE): 0.2 10*3/uL (ref 0.0–0.4)
EOS: 4 %
HEMATOCRIT: 46.9 % (ref 37.5–51.0)
Hemoglobin: 16.2 g/dL (ref 13.0–17.7)
Immature Grans (Abs): 0.1 10*3/uL (ref 0.0–0.1)
Immature Granulocytes: 1 %
LYMPHS ABS: 2.2 10*3/uL (ref 0.7–3.1)
Lymphs: 37 %
MCH: 30.3 pg (ref 26.6–33.0)
MCHC: 34.5 g/dL (ref 31.5–35.7)
MCV: 88 fL (ref 79–97)
Monocytes Absolute: 0.6 10*3/uL (ref 0.1–0.9)
Monocytes: 10 %
NEUTROS PCT: 47 %
Neutrophils Absolute: 2.7 10*3/uL (ref 1.4–7.0)
RBC: 5.34 x10E6/uL (ref 4.14–5.80)
RDW: 14.4 % (ref 12.3–15.4)
WBC: 5.8 10*3/uL (ref 3.4–10.8)

## 2016-06-14 LAB — LIPID PANEL
Chol/HDL Ratio: 5.9 ratio units — ABNORMAL HIGH (ref 0.0–5.0)
Cholesterol, Total: 253 mg/dL — ABNORMAL HIGH (ref 100–199)
HDL: 43 mg/dL (ref >39)
Triglycerides: 542 mg/dL — ABNORMAL HIGH (ref 0–149)

## 2016-06-14 LAB — EKG 12-LEAD

## 2016-06-14 LAB — TSH: TSH: 1.11 u[IU]/mL (ref 0.450–4.500)

## 2016-06-14 LAB — HEMOGLOBIN A1C
Est. average glucose Bld gHb Est-mCnc: 111 mg/dL
HEMOGLOBIN A1C: 5.5 % (ref 4.8–5.6)

## 2016-06-15 ENCOUNTER — Telehealth: Payer: Self-pay | Admitting: Pharmacy Technician

## 2016-06-15 ENCOUNTER — Ambulatory Visit: Payer: Self-pay | Admitting: Ophthalmology

## 2016-06-15 NOTE — Telephone Encounter (Signed)
Patient scheduled for eligibility on 06/12/16 at 4:30p.m.  Patient was a no show and did not reschedule appointment.  Have attempted twice to reach patient by phone.  Unsuccessful.  Had to leave message.  Patient failed to provide pay stubs from wife, sign Consulting civil engineer and tax return information.  [facility] will be unable to provide medication assistance until all proof of income information is provided by patient.  [facility]

## 2016-06-22 ENCOUNTER — Ambulatory Visit: Payer: Self-pay | Admitting: Licensed Clinical Social Worker

## 2016-06-22 ENCOUNTER — Ambulatory Visit: Payer: Self-pay | Admitting: Adult Health Nurse Practitioner

## 2016-06-22 DIAGNOSIS — I10 Essential (primary) hypertension: Secondary | ICD-10-CM

## 2016-06-22 NOTE — Progress Notes (Signed)
  Patient: Geoggrey Gresh Male    DOB: 09/25/1970   46 y.o.   MRN: [id number] Visit Date: 06/22/2016  Today's Provider: Staci Acosta, NP   No chief complaint on file.  Subjective:    HPI   Pt presents to clinic for follow up of hypertensive crisis a couple weeks ago. Per ED note patient left AMA despite still having high BP. Pt presents today with continued uncontrolled HTN along with migraines and CP.      No Known Allergies Previous Medications   ALBUTEROL (PROVENTIL HFA;VENTOLIN HFA) 108 (90 BASE) MCG/ACT INHALER    Inhale 2 puffs into the lungs every 6 (six) hours as needed for wheezing or shortness of breath.   AMLODIPINE (NORVASC) 10 MG TABLET    Take 1 tablet (10 mg total) by mouth daily.   LISINOPRIL-HYDROCHLOROTHIAZIDE (PRINZIDE,ZESTORETIC) 20-12.5 MG TABLET    Take 1 tablet by mouth daily.    Review of Systems  Social History  Substance Use Topics  . Smoking status: Current Every Day Smoker    Packs/day: 0.50    Types: Cigarettes  . Smokeless tobacco: Never Used  . Alcohol use No   Objective:   There were no vitals taken for this visit.  Physical Exam    Exam deferred.  BP 222/146  Assessment & Plan:        Pt sent to the  ER via private car  for hypertensive urgency.    Staci Acosta, NP   [facility] [place]

## 2016-07-27 ENCOUNTER — Ambulatory Visit: Payer: Self-pay | Admitting: Ophthalmology

## 2016-09-21 ENCOUNTER — Ambulatory Visit: Payer: Self-pay | Admitting: Ophthalmology

## 2016-09-28 ENCOUNTER — Ambulatory Visit: Payer: Self-pay | Admitting: Ophthalmology

## 2016-10-05 ENCOUNTER — Ambulatory Visit: Payer: Self-pay | Admitting: Ophthalmology

## 2016-10-12 ENCOUNTER — Ambulatory Visit: Payer: Self-pay | Admitting: Ophthalmology

## 2016-10-14 ENCOUNTER — Emergency Department: Payer: Self-pay

## 2016-10-14 ENCOUNTER — Inpatient Hospital Stay
Admission: EM | Admit: 2016-10-14 | Discharge: 2016-10-16 | DRG: 190 | Disposition: A | Discharge status: Home / Self Care | Payer: Self-pay | Attending: Internal Medicine | Admitting: Internal Medicine

## 2016-10-14 DIAGNOSIS — G4700 Insomnia, unspecified: Secondary | ICD-10-CM | POA: Diagnosis present

## 2016-10-14 DIAGNOSIS — J440 Chronic obstructive pulmonary disease with acute lower respiratory infection: Principal | ICD-10-CM | POA: Diagnosis present

## 2016-10-14 DIAGNOSIS — I10 Essential (primary) hypertension: Secondary | ICD-10-CM | POA: Diagnosis present

## 2016-10-14 DIAGNOSIS — J189 Pneumonia, unspecified organism: Secondary | ICD-10-CM | POA: Diagnosis present

## 2016-10-14 DIAGNOSIS — J441 Chronic obstructive pulmonary disease with (acute) exacerbation: Secondary | ICD-10-CM | POA: Diagnosis present

## 2016-10-14 DIAGNOSIS — R748 Abnormal levels of other serum enzymes: Secondary | ICD-10-CM | POA: Diagnosis present

## 2016-10-14 DIAGNOSIS — Z720 Tobacco use: Secondary | ICD-10-CM | POA: Diagnosis present

## 2016-10-14 DIAGNOSIS — J9801 Acute bronchospasm: Secondary | ICD-10-CM

## 2016-10-14 DIAGNOSIS — Z79899 Other long term (current) drug therapy: Secondary | ICD-10-CM

## 2016-10-14 DIAGNOSIS — F17210 Nicotine dependence, cigarettes, uncomplicated: Secondary | ICD-10-CM | POA: Diagnosis present

## 2016-10-14 LAB — BASIC METABOLIC PANEL
Anion gap: 10 (ref 5–15)
BUN: 12 mg/dL (ref 6–20)
CHLORIDE: 100 mmol/L — AB (ref 101–111)
CO2: 26 mmol/L (ref 22–32)
CREATININE: 1.07 mg/dL (ref 0.61–1.24)
Calcium: 9.7 mg/dL (ref 8.9–10.3)
GFR calc Af Amer: >60 mL/min (ref >60)
GFR calc non Af Amer: >60 mL/min (ref >60)
Glucose, Bld: 131 mg/dL — ABNORMAL HIGH (ref 65–99)
POTASSIUM: 3.7 mmol/L (ref 3.5–5.1)
Sodium: 136 mmol/L (ref 135–145)

## 2016-10-14 LAB — CBC
HCT: 45.9 % (ref 40.0–52.0)
HEMATOCRIT: 47.5 % (ref 40.0–52.0)
HEMOGLOBIN: 16 g/dL (ref 13.0–18.0)
Hemoglobin: 16.7 g/dL (ref 13.0–18.0)
MCH: 30.2 pg (ref 26.0–34.0)
MCH: 30.1 pg (ref 26.0–34.0)
MCHC: 35.2 g/dL (ref 32.0–36.0)
MCHC: 34.9 g/dL (ref 32.0–36.0)
MCV: 85.7 fL (ref 80.0–100.0)
MCV: 86.3 fL (ref 80.0–100.0)
PLATELETS: 234 10*3/uL (ref 150–440)
Platelets: 233 10*3/uL (ref 150–440)
RBC: 5.54 MIL/uL (ref 4.40–5.90)
RBC: 5.32 MIL/uL (ref 4.40–5.90)
RDW: 13.4 % (ref 11.5–14.5)
RDW: 13.3 % (ref 11.5–14.5)
WBC: 8.1 10*3/uL (ref 3.8–10.6)
WBC: 8.8 10*3/uL (ref 3.8–10.6)

## 2016-10-14 LAB — CREATININE, SERUM
CREATININE: 1.01 mg/dL (ref 0.61–1.24)
GFR calc Af Amer: >60 mL/min (ref >60)

## 2016-10-14 LAB — INFLUENZA PANEL BY PCR (TYPE A & B)
Influenza A By PCR: NEGATIVE
Influenza B By PCR: NEGATIVE

## 2016-10-14 LAB — TROPONIN I: Troponin I: 0.04 ng/mL (ref <0.03)

## 2016-10-14 MED ORDER — ONDANSETRON HCL 4 MG PO TABS: 4.0000 mg | ORAL_TABLET | Freq: Four times a day (QID) | ORAL | Status: DC | PRN

## 2016-10-14 MED ORDER — AMLODIPINE BESYLATE 10 MG PO TABS
10.0000 mg | ORAL_TABLET | Freq: Every day | ORAL | Status: DC
  Administered 2016-10-14 – 2016-10-16 (×3): 10 mg via ORAL
  Filled 2016-10-14 (×3): qty 1

## 2016-10-14 MED ORDER — NICOTINE 14 MG/24HR TD PT24
14.0000 mg | MEDICATED_PATCH | Freq: Every day | TRANSDERMAL | Status: DC
  Administered 2016-10-14 – 2016-10-16 (×3): 14 mg via TRANSDERMAL
  Filled 2016-10-14 (×3): qty 1

## 2016-10-14 MED ORDER — ONDANSETRON HCL 4 MG PO TABS
4.0000 mg | ORAL_TABLET | Freq: Once | ORAL | Status: AC
  Administered 2016-10-14: 4 mg via ORAL
  Filled 2016-10-14: qty 1

## 2016-10-14 MED ORDER — AZITHROMYCIN 500 MG PO TABS
500.0000 mg | ORAL_TABLET | Freq: Once | ORAL | Status: AC
  Administered 2016-10-14: 500 mg via ORAL
  Filled 2016-10-14: qty 1

## 2016-10-14 MED ORDER — IPRATROPIUM-ALBUTEROL 0.5-2.5 (3) MG/3ML IN SOLN
9.0000 mL | Freq: Once | RESPIRATORY_TRACT | Status: AC
  Administered 2016-10-14: 9 mL via RESPIRATORY_TRACT
  Filled 2016-10-14: qty 9

## 2016-10-14 MED ORDER — LISINOPRIL 20 MG PO TABS
20.0000 mg | ORAL_TABLET | Freq: Every day | ORAL | Status: DC
  Administered 2016-10-14 – 2016-10-16 (×3): 20 mg via ORAL
  Filled 2016-10-14 (×3): qty 1

## 2016-10-14 MED ORDER — ENOXAPARIN SODIUM 40 MG/0.4ML ~~LOC~~ SOLN
40.0000 mg | SUBCUTANEOUS | Status: DC
  Administered 2016-10-14 – 2016-10-15 (×2): 40 mg via SUBCUTANEOUS
  Filled 2016-10-14 (×3): qty 0.4

## 2016-10-14 MED ORDER — DEXTROSE 5 % IV SOLN
2.0000 g | Freq: Once | INTRAVENOUS | Status: AC
  Administered 2016-10-14: 2 g via INTRAVENOUS
  Filled 2016-10-14: qty 2

## 2016-10-14 MED ORDER — POTASSIUM CHLORIDE IN NACL 20-0.9 MEQ/L-% IV SOLN
INTRAVENOUS | Status: DC
  Administered 2016-10-14: 14:00:00 via INTRAVENOUS
  Filled 2016-10-14 (×7): qty 1000

## 2016-10-14 MED ORDER — SENNOSIDES-DOCUSATE SODIUM 8.6-50 MG PO TABS
1.0000 | ORAL_TABLET | Freq: Every evening | ORAL | Status: DC | PRN
Start: 2016-10-14 — End: 2016-10-16

## 2016-10-14 MED ORDER — PNEUMOCOCCAL VAC POLYVALENT 25 MCG/0.5ML IJ INJ: 0.5000 mL | INJECTION | INTRAMUSCULAR | Status: DC

## 2016-10-14 MED ORDER — METHYLPREDNISOLONE SODIUM SUCC 125 MG IJ SOLR
125.0000 mg | Freq: Once | INTRAMUSCULAR | Status: AC
  Administered 2016-10-14: 125 mg via INTRAVENOUS
  Filled 2016-10-14: qty 2

## 2016-10-14 MED ORDER — SODIUM CHLORIDE 0.9 % IV BOLUS (SEPSIS)
1000.0000 mL | Freq: Once | INTRAVENOUS | Status: AC
  Administered 2016-10-14: 1000 mL via INTRAVENOUS

## 2016-10-14 MED ORDER — IPRATROPIUM-ALBUTEROL 0.5-2.5 (3) MG/3ML IN SOLN
3.0000 mL | RESPIRATORY_TRACT | Status: DC | PRN
  Administered 2016-10-15: 3 mL via RESPIRATORY_TRACT
  Filled 2016-10-14: qty 3

## 2016-10-14 MED ORDER — KETOROLAC TROMETHAMINE 30 MG/ML IJ SOLN
30.0000 mg | Freq: Once | INTRAMUSCULAR | Status: AC
  Administered 2016-10-14: 30 mg via INTRAVENOUS
  Filled 2016-10-14: qty 1

## 2016-10-14 MED ORDER — METHYLPREDNISOLONE SODIUM SUCC 125 MG IJ SOLR
60.0000 mg | Freq: Three times a day (TID) | INTRAMUSCULAR | Status: DC
  Administered 2016-10-14 – 2016-10-16 (×6): 60 mg via INTRAVENOUS
  Filled 2016-10-14 (×6): qty 2

## 2016-10-14 MED ORDER — SODIUM CHLORIDE 0.9 % IV BOLUS (SEPSIS)
500.0000 mL | Freq: Once | INTRAVENOUS | Status: AC
  Administered 2016-10-14: 500 mL via INTRAVENOUS

## 2016-10-14 MED ORDER — ACETAMINOPHEN 650 MG RE SUPP: 650.0000 mg | Freq: Four times a day (QID) | RECTAL | Status: DC | PRN

## 2016-10-14 MED ORDER — BENZONATATE 100 MG PO CAPS
100.0000 mg | ORAL_CAPSULE | Freq: Once | ORAL | Status: AC
  Administered 2016-10-14: 100 mg via ORAL
  Filled 2016-10-14: qty 1

## 2016-10-14 MED ORDER — DEXTROSE 5 % IV SOLN
500.0000 mg | INTRAVENOUS | Status: DC
  Administered 2016-10-15 – 2016-10-16 (×2): 500 mg via INTRAVENOUS
  Filled 2016-10-14 (×2): qty 500

## 2016-10-14 MED ORDER — KETOROLAC TROMETHAMINE 15 MG/ML IJ SOLN
15.0000 mg | Freq: Four times a day (QID) | INTRAMUSCULAR | Status: DC | PRN
  Administered 2016-10-14 – 2016-10-15 (×2): 15 mg via INTRAVENOUS
  Filled 2016-10-14 (×2): qty 1

## 2016-10-14 MED ORDER — ENOXAPARIN SODIUM 30 MG/0.3ML ~~LOC~~ SOLN: 30.0000 mg | SUBCUTANEOUS | Status: DC

## 2016-10-14 MED ORDER — METOPROLOL TARTRATE 5 MG/5ML IV SOLN: 5.0000 mg | INTRAVENOUS | Status: DC | PRN

## 2016-10-14 MED ORDER — ACETAMINOPHEN 325 MG PO TABS
650.0000 mg | ORAL_TABLET | Freq: Four times a day (QID) | ORAL | Status: DC | PRN
  Administered 2016-10-14 – 2016-10-16 (×4): 650 mg via ORAL
  Filled 2016-10-14 (×4): qty 2

## 2016-10-14 MED ORDER — BENZONATATE 100 MG PO CAPS
100.0000 mg | ORAL_CAPSULE | Freq: Three times a day (TID) | ORAL | Status: DC | PRN
  Administered 2016-10-14 – 2016-10-15 (×3): 100 mg via ORAL
  Filled 2016-10-14 (×3): qty 1

## 2016-10-14 MED ORDER — HYDROCHLOROTHIAZIDE 12.5 MG PO CAPS
12.5000 mg | ORAL_CAPSULE | Freq: Every day | ORAL | Status: DC
  Administered 2016-10-14 – 2016-10-16 (×3): 12.5 mg via ORAL
  Filled 2016-10-14 (×3): qty 1

## 2016-10-14 MED ORDER — DEXTROSE 5 % IV SOLN
1.0000 g | INTRAVENOUS | Status: DC
  Administered 2016-10-15: 1 g via INTRAVENOUS
  Filled 2016-10-14 (×2): qty 10

## 2016-10-14 MED ORDER — LISINOPRIL-HYDROCHLOROTHIAZIDE 20-12.5 MG PO TABS: 1.0000 | ORAL_TABLET | Freq: Every day | ORAL | Status: DC

## 2016-10-14 MED ORDER — ONDANSETRON HCL 4 MG/2ML IJ SOLN: 4.0000 mg | Freq: Four times a day (QID) | INTRAMUSCULAR | Status: DC | PRN

## 2016-10-14 NOTE — Progress Notes (Signed)
Patient was admitted to room 147 from ER via cart. On droplet isolation. IV bolus still running. Tele and cont pulse applied. Up Ad lib in room. VSS. On RA. Reviewed POC and orders. Oriented to room, call light, TV and bed controls.

## 2016-10-14 NOTE — ED Provider Notes (Signed)
[facility] Emergency Department Provider Note  ____________________________________________   First MD Initiated Contact with Patient 10/14/16 [phone]     (approximate)  I have reviewed the triage vital signs and the nursing notes.   HISTORY  Chief Complaint Flu like symptoms   HPI Christopher Harrell is a 46 y.o. male with a history of community-acquired pneumonia was presenting emergency department today after single episode. The patient has had 3 days of nausea vomiting diarrhea as well as nasal congestion. He said that he also has diffuse body aches. 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 He does describe chest and abdominal cramping but says this has been ongoing over the past 3 days. No known sick contacts. Said that he had a similar episode last year was diagnosed with pneumonia.   Past Medical History:  Diagnosis Date  . Community acquired pneumonia    a. Dx 01/21/2016 - LLL PNA.  . Essential hypertension   . Tobacco abuse     Patient Active Problem List   Diagnosis Date Noted  . Hypertensive urgency, malignant 06/13/2016  . Tobacco abuse 06/13/2016  . Elevated troponin 01/23/2016  . HTN (hypertension) 01/23/2016  . Chest pain 01/23/2016    Past Surgical History:  Procedure Laterality Date  . NO PAST SURGERIES      Prior to Admission medications   Medication Sig Start Date End Date Taking? Authorizing Provider  amLODipine (NORVASC) 10 MG tablet Take 1 tablet (10 mg total) by mouth daily. 05/30/16 05/30/17 Yes Odem, Coolidge Breeze, FNP  lisinopril-hydrochlorothiazide (PRINZIDE,ZESTORETIC) 20-12.5 MG tablet Take 1 tablet by mouth daily. 05/30/16  Yes Odem, Coolidge Breeze, FNP  albuterol (PROVENTIL HFA;VENTOLIN HFA) 108 (90 Base) MCG/ACT inhaler Inhale 2 puffs into the lungs every 6 (six) hours as needed for wheezing or shortness of  breath. Patient not taking: Reported on 06/13/2016 01/21/16   Menshew, Dannielle Karvonen, PA-C    Allergies Patient has no known allergies.  Family History  Problem Relation Age of Onset  . Hypertension Mother   . Hypertension Father   . Heart attack Maternal Grandmother     Social History Social History  Substance Use Topics  . Smoking status: Current Every Day Smoker    Packs/day: 0.50    Types: Cigarettes  . Smokeless tobacco: Never Used  . Alcohol use No    Review of Systems  Constitutional: No fever/chills Eyes: No visual changes. ENT: No sore throat. Cardiovascular:as above Respiratory: as above Gastrointestinal:   No constipation. Genitourinary: Negative for dysuria. Musculoskeletal: Negative for back pain. Skin: Negative for rash. Neurological: Negative for headaches, focal weakness or numbness.   ____________________________________________   PHYSICAL EXAM:  VITAL SIGNS: ED Triage Vitals  Enc Vitals Group     BP 10/14/16 0905 (!) 158/108     Pulse --      Resp 10/14/16 0905 18     Temp 10/14/16 0905 98.8 F (37.1 C)     Temp Source 10/14/16 0905 Oral     SpO2 10/14/16 0905 98 %     Weight 10/14/16 0907 195 lb (88.5 kg)     Height 10/14/16 0907 5\' 8"  (1.727 m)     Head Circumference --      Peak Flow --      Pain Score --      Pain Loc --  Pain Edu? --      Excl. in [place]? --     Constitutional: Alert and oriented. Well appearing and in no acute distress. Eyes: Conjunctivae are normal.  Head: Atraumatic. Nose: No congestion/rhinnorhea. Mouth/Throat: Mucous membranes are moist.  Neck: No stridor.   Cardiovascular: tachycardic, regular rhythm. Grossly normal heart sounds.   Respiratory: Normal respiratory effort.  Coarse wheezing throughout with a prolonged extrication. Gastrointestinal: Soft and nontender. No distention.  Musculoskeletal: No lower extremity tenderness nor edema.  No joint effusions. Neurologic:  Normal speech and language.  No gross focal neurologic deficits are appreciated. Skin:  Skin is warm, dry and intact. No rash noted. Psychiatric: Mood and affect are normal. Speech and behavior are normal.  ____________________________________________   LABS (all labs ordered are listed, but only abnormal results are displayed)  Labs Reviewed  BASIC METABOLIC PANEL - Abnormal; Notable for the following:       Result Value   Chloride 100 (*)    Glucose, Bld 131 (*)    All other components within normal limits  TROPONIN I - Abnormal; Notable for the following:    Troponin I 0.04 (*)    All other components within normal limits  CBC   ____________________________________________  EKG  ED ECG REPORT I, Doran Stabler, the attending physician, personally viewed and interpreted this ECG.   Date: 10/14/2016  EKG Time: 0928  Rate: 102  Rhythm: sinus tachycardia  Axis: Normal  Intervals:none  ST&T Change: No ST segment elevation or depression. No abnormal T-wave inversion.  ____________________________________________  RADIOLOGY  Lingular atelectasis versus pneumonia. ____________________________________________   PROCEDURES  Procedure(s) performed:   Procedures  Critical Care performed:   ____________________________________________   INITIAL IMPRESSION / ASSESSMENT AND PLAN / ED COURSE  Pertinent labs & imaging results that were available during my care of the patient were reviewed by me and considered in my medical decision making (see chart for details).       ----------------------------------------- 11:47 AM on 10/14/2016 -----------------------------------------  Patient at this time says he only feels minimal improvement. Still with tachypnea as well as diffuse wheezing. We'll do to the hospital. We will treat for 2 minute according pneumonia. The patient is understanding of this plan and willing to comply. Signed out to Dr.  Clotilde Dieter. ____________________________________________   FINAL CLINICAL IMPRESSION(S) / ED DIAGNOSES  Community acquired pneumonia. Wheezing.    NEW MEDICATIONS STARTED DURING THIS VISIT:  New Prescriptions   No medications on file     Note:  This document was prepared using Dragon voice recognition software and may include unintentional dictation errors.     Orbie Pyo, MD 10/14/16 [phone]

## 2016-10-14 NOTE — ED Notes (Signed)
Bag lunch given

## 2016-10-14 NOTE — Progress Notes (Signed)
Anticoagulation monitoring(Lovenox):  45yo  male ordered Lovenox 30 mg Q24h  Filed Weights   10/14/16 0907 10/14/16 1326  Weight: 195 lb (88.5 kg) 197 lb (89.4 kg)   BMI 29.9   Lab Results  Component Value Date   CREATININE 1.07 10/14/2016   CREATININE 0.95 06/13/2016   CREATININE 0.93 06/13/2016   Estimated Creatinine Clearance: 92.7 mL/min (by C-G formula based on SCr of 1.07 mg/dL). Hemoglobin & Hematocrit     Component Value Date/Time   HGB 16.7 10/14/2016 0916   HGB 16.2 06/13/2016 1822   HCT 47.5 10/14/2016 0916   HCT 46.9 06/13/2016 1822     Per Protocol for Patient with estCrcl > 30 ml/min and BMI < 40, will transition to Lovenox 40 mg Q24h.

## 2016-10-14 NOTE — ED Triage Notes (Signed)
Pt to ED with c/o of N/V/D and generalized body aches that started 3 days ago.

## 2016-10-14 NOTE — H&P (Signed)
PCP:   Patient, No Pcp Per   Chief Complaint:  Shortness of breath, body aches  HPI: This is a 46 year old male who for the past 3 days has been lightheaded and dizzy. He has significant generalized body aches. He has been nauseous and vomiting, no hematemesis. He reports insomnia. He has a cough productive of a whitish phlegm. He has chills but no fevers. He's been stuffy and wheezing. The patient seems quite ill. He reports that he has pneumonia every year. He continues to smoke.  Review of Systems:  The patient denies anorexia, fever, chills, weight loss,, vision loss, decreased hearing, hoarseness, cough, pleuritic chest pain, syncope, dyspnea on exertion, peripheral edema, balance deficits, hemoptysis, abdominal pain, melena, hematochezia, nausea, vomiting, severe indigestion/heartburn, hematuria, incontinence, genital sores, muscle weakness, suspicious skin lesions, transient blindness, difficulty walking, depression, unusual weight change, abnormal bleeding, enlarged lymph nodes, angioedema, and breast masses.  Past Medical History: Past Medical History:  Diagnosis Date  . Community acquired pneumonia    a. Dx 01/21/2016 - LLL PNA.  . Essential hypertension   . Tobacco abuse    Past Surgical History:  Procedure Laterality Date  . NO PAST SURGERIES      Medications: Prior to Admission medications   Medication Sig Start Date End Date Taking? Authorizing Provider  amLODipine (NORVASC) 10 MG tablet Take 1 tablet (10 mg total) by mouth daily. 05/30/16 05/30/17 Yes Odem, Coolidge Breeze, FNP  lisinopril-hydrochlorothiazide (PRINZIDE,ZESTORETIC) 20-12.5 MG tablet Take 1 tablet by mouth daily. 05/30/16  Yes Odem, Coolidge Breeze, FNP  albuterol (PROVENTIL HFA;VENTOLIN HFA) 108 (90 Base) MCG/ACT inhaler Inhale 2 puffs into the lungs every 6 (six) hours as needed for wheezing or shortness of breath. Patient not taking: Reported on 06/13/2016 01/21/16   Menshew, Dannielle Karvonen, PA-C    Allergies:  No Known  Allergies  Social History:  reports that he has been smoking Cigarettes.  He has been smoking about 0.50 packs per day. He has never used smokeless tobacco. He reports that he does not drink alcohol or use drugs.  Family History: Family History  Problem Relation Age of Onset  . Hypertension Mother   . Hypertension Father   . Heart attack Maternal Grandmother     Physical Exam: Vitals:   10/14/16 0905 10/14/16 0907 10/14/16 0930 10/14/16 1030  BP: (!) 158/108  (!) 137/93 (!) 146/99  Pulse:   (!) 103 93  Resp: 18  14 (!) 24  Temp: 98.8 F (37.1 C)     TempSrc: Oral     SpO2: 98%  94% 95%  Weight:  88.5 kg (195 lb)    Height:  5\' 8"  (1.727 m)      General:  Alert and oriented times three, well developed and nourished, no acute distress,Ill-appearing male Eyes: PERRLA, pink conjunctiva, no scleral icterus ENT: Moist oral mucosa, neck supple, no thyromegaly Lungs: clear to ascultation, no wheeze, no crackles, no use of accessory muscles Cardiovascular: regular rate and rhythm, no regurgitation, no gallops, no murmurs. No carotid bruits, no JVD Abdomen: soft, positive BS, non-tender, non-distended, no organomegaly, not an acute abdomen GU: not examined Neuro: CN II - XII grossly intact, sensation intact Musculoskeletal: strength 5/5 all extremities, no clubbing, cyanosis or edema Skin: no rash, no subcutaneous crepitation, no decubitus Psych: appropriate patient   Labs on Admission:   Recent Labs  10/14/16 0916  NA 136  K 3.7  CL 100*  CO2 26  GLUCOSE 131*  BUN 12  CREATININE 1.07  CALCIUM 9.7   No results for input(s): AST, ALT, ALKPHOS, BILITOT, PROT, ALBUMIN in the last 72 hours. No results for input(s): LIPASE, AMYLASE in the last 72 hours.  Recent Labs  10/14/16 0916  WBC 8.1  HGB 16.7  HCT 47.5  MCV 85.7  PLT 233    Recent Labs  10/14/16 0916  TROPONINI 0.04*   Invalid input(s): POCBNP No results for input(s): DDIMER in the last 72 hours. No  results for input(s): HGBA1C in the last 72 hours. No results for input(s): CHOL, HDL, LDLCALC, TRIG, CHOLHDL, LDLDIRECT in the last 72 hours. No results for input(s): TSH, T4TOTAL, T3FREE, THYROIDAB in the last 72 hours.  Invalid input(s): FREET3 No results for input(s): VITAMINB12, FOLATE, FERRITIN, TIBC, IRON, RETICCTPCT in the last 72 hours.  Micro Results: No results found for this or any previous visit (from the past 240 hour(s)).   Radiological Exams on Admission: Dg Chest 2 View  Result Date: 10/14/2016 CLINICAL DATA:  Nausea vomiting and diarrhea. Generalized body aches. EXAM: CHEST  2 VIEW COMPARISON:  06/13/2016 FINDINGS: The heart size appears normal. No pleural effusion or edema. Atelectasis and/or airspace consolidation within the lingula is identified. Thickening of the minor fissure is noted. IMPRESSION: 1. Lingular atelectasis versus pneumonia. 2. Thickening of the minor fissure. Electronically Signed   By: Kerby Moors M.D.   On: 10/14/2016 09:45    Assessment/Plan Present on Admission: . Community acquired pneumonia -Admit to MedSurg -Blood cultures 2 ordered -Rocephin, azithromycin -Oxygen keep sats greater than 88% -Influenza cultures ordered -Toradol PRN muscle aches, first dose now -Tessalon Perles when necessary cough  . COPD with acute exacerbation (HCC) -Solu-Medrol 60 mg IV every 8 -See above  . Tobacco abuse -Nicotine patch, duonebs  . HTN (hypertension) -Stable, home medications renewed -. Blood pressure medications ordered  Elevated troponin -chronic, unchanged his last admission    Arloa Prak 10/14/2016, 12:22 PM

## 2016-10-14 NOTE — Progress Notes (Signed)
Patient is A&O x4, up with SBA. SCDs and Tele in place. Droplet isolation. Cough non-productive and dry. Patient states it was productive at home. Lungs have exp. Wheezes. Uses urinal at bedside. Tolerated diet without N&V. IV fluids running. C/O of general body aches.

## 2016-10-15 LAB — BASIC METABOLIC PANEL
Anion gap: 8 (ref 5–15)
BUN: 19 mg/dL (ref 6–20)
CHLORIDE: 104 mmol/L (ref 101–111)
CO2: 24 mmol/L (ref 22–32)
CREATININE: 0.98 mg/dL (ref 0.61–1.24)
Calcium: 9.2 mg/dL (ref 8.9–10.3)
Glucose, Bld: 163 mg/dL — ABNORMAL HIGH (ref 65–99)
Potassium: 4.1 mmol/L (ref 3.5–5.1)
SODIUM: 136 mmol/L (ref 135–145)

## 2016-10-15 LAB — CBC
HCT: 44.9 % (ref 40.0–52.0)
Hemoglobin: 15.4 g/dL (ref 13.0–18.0)
MCH: 29.6 pg (ref 26.0–34.0)
MCHC: 34.2 g/dL (ref 32.0–36.0)
MCV: 86.4 fL (ref 80.0–100.0)
PLATELETS: 229 10*3/uL (ref 150–440)
RBC: 5.2 MIL/uL (ref 4.40–5.90)
RDW: 13.4 % (ref 11.5–14.5)
WBC: 15.4 10*3/uL — AB (ref 3.8–10.6)

## 2016-10-15 LAB — HIV ANTIBODY (ROUTINE TESTING W REFLEX): HIV SCREEN 4TH GENERATION: NONREACTIVE

## 2016-10-15 MED ORDER — GUAIFENESIN-CODEINE 100-10 MG/5ML PO SOLN
10.0000 mL | Freq: Four times a day (QID) | ORAL | Status: DC | PRN
  Administered 2016-10-16: 10 mL via ORAL
  Filled 2016-10-15: qty 10

## 2016-10-15 MED ORDER — MORPHINE SULFATE (PF) 2 MG/ML IV SOLN
2.0000 mg | INTRAVENOUS | Status: DC | PRN
  Administered 2016-10-15 (×2): 2 mg via INTRAVENOUS
  Filled 2016-10-15 (×2): qty 1

## 2016-10-15 MED ORDER — GUAIFENESIN ER 600 MG PO TB12
600.0000 mg | ORAL_TABLET | Freq: Two times a day (BID) | ORAL | Status: DC
  Administered 2016-10-15 – 2016-10-16 (×3): 600 mg via ORAL
  Filled 2016-10-15 (×3): qty 1

## 2016-10-15 NOTE — Progress Notes (Signed)
Pt c/o pleuritic chest pain when coughing. Not improved with current pain meds

## 2016-10-15 NOTE — Progress Notes (Signed)
Shift assessment completed. Pt is awake, alert and oriented, c/o generalized aching, tells this Probation officer that he has pneumonia, he had pna last year in June as well. Pt has some nasal congestion, expiratory wheezes bilat with storng cough that he says hurts his chest. Hr is regular, telebox in place. Abdomen is soft, bs heard. Pt has urinal in reach, ppp, no edema noted. scd's removed at assessment. piv #20 intact to rac with site free of redness and swelling, iv ns with 20 meq kcl infusing at 100ls/hr per md order. Since assessment, pt continues resting in bed, consumed breakfast with no complaints. Call bell in reach.

## 2016-10-15 NOTE — Progress Notes (Signed)
[place] [facility]                                                                                                                                                                                  Patient Demographics   Christopher Harrell, is a 46 y.o. male, DOB - Nov 07, 1970, OJJ:[id number]  Admit date - 10/14/2016   Admitting Physician Quintella Baton, MD  Outpatient Primary MD for the patient is Patient, No Pcp Per   LOS - 1  Subjective: Patient reports that he's feeling like he is dying, complains of pain all over Complains of cough and congestion   Review of Systems:   CONSTITUTIONAL: No documented fever. No fatigue, weakness. No weight gain, no weight loss.  EYES: No blurry or double vision.  ENT: No tinnitus. No postnasal drip. No redness of the oropharynx.  RESPIRATORY: Positive cough, no wheeze, no hemoptysis. Positive dyspnea.  CARDIOVASCULAR: No chest pain. No orthopnea. No palpitations. No syncope.  GASTROINTESTINAL: No nausea, no vomiting or diarrhea. No abdominal pain. No melena or hematochezia.  GENITOURINARY: No dysuria or hematuria.  ENDOCRINE: No polyuria or nocturia. No heat or cold intolerance.  HEMATOLOGY: No anemia. No bruising. No bleeding.  INTEGUMENTARY: No rashes. No lesions.  MUSCULOSKELETAL: No arthritis. No swelling. No gout.  NEUROLOGIC: No numbness, tingling, or ataxia. No seizure-type activity.  PSYCHIATRIC: No anxiety. No insomnia. No ADD.    Vitals:   Vitals:   10/15/16 0100 10/15/16 0428 10/15/16 0452 10/15/16 0716  BP: 140/86 (!) 129/92  131/87  Pulse: 89 91  95  Resp: 18 (!) 25  18  Temp: 98.6 F (37 C) 97.8 F (36.6 C)  98.4 F (36.9 C)  TempSrc: Oral Oral  Oral  SpO2: 92% 96% 96% 93%  Weight:      Height:        Wt Readings from Last 3 Encounters:  10/14/16 197 lb (89.4 kg)  06/22/16 217 lb 3.2 oz (98.5 kg)  06/13/16 212 lb (96.2 kg)     Intake/Output Summary (Last 24 hours) at 10/15/16 1344 Last  data filed at 10/15/16 0900  Gross per 24 hour  Intake             1680 ml  Output             1300 ml  Net              380 ml    Physical Exam:   GENERAL: Pleasant-appearing in no apparent distress.  HEAD, EYES, EARS, NOSE AND THROAT: Atraumatic, normocephalic. Extraocular muscles are intact. Pupils equal and reactive to light. Sclerae anicteric. No conjunctival injection. No oro-pharyngeal erythema.  NECK: Supple. There is no jugular venous distention. No bruits, no lymphadenopathy, no thyromegaly.  HEART: Regular rate and rhythm,. No murmurs, no rubs, no clicks.  LUNGS: Bilateral wheezing throughout both lungs no sensory muscle usage ABDOMEN: Soft, flat, nontender, nondistended. Has good bowel sounds. No hepatosplenomegaly appreciated.  EXTREMITIES: No evidence of any cyanosis, clubbing, or peripheral edema.  +2 pedal and radial pulses bilaterally.  NEUROLOGIC: The patient is alert, awake, and oriented x3 with no focal motor or sensory deficits appreciated bilaterally.  SKIN: Moist and warm with no rashes appreciated.  Psych: Not anxious, depressed LN: No inguinal LN enlargement    Antibiotics   Anti-infectives    Start     Dose/Rate Route Frequency Ordered Stop   10/15/16 1200  cefTRIAXone (ROCEPHIN) 1 g in dextrose 5 % 50 mL IVPB     1 g 100 mL/hr over 30 Minutes Intravenous Every 24 hours 10/14/16 1325     10/15/16 1000  azithromycin (ZITHROMAX) 500 mg in dextrose 5 % 250 mL IVPB     500 mg 250 mL/hr over 60 Minutes Intravenous Every 24 hours 10/14/16 1325     10/14/16 1200  cefTRIAXone (ROCEPHIN) 2 g in dextrose 5 % 50 mL IVPB     2 g 100 mL/hr over 30 Minutes Intravenous  Once 10/14/16 1147 10/14/16 1306   10/14/16 0945  azithromycin (ZITHROMAX) tablet 500 mg     500 mg Oral  Once 10/14/16 0944 10/14/16 0952      Medications   Scheduled Meds: . amLODipine  10 mg Oral Daily  . enoxaparin (LOVENOX) injection  40 mg Subcutaneous Q24H  . guaiFENesin  600 mg Oral BID   . lisinopril  20 mg Oral Daily   And  . hydrochlorothiazide  12.5 mg Oral Daily  . methylPREDNISolone (SOLU-MEDROL) injection  60 mg Intravenous Q8H  . nicotine  14 mg Transdermal Daily  . pneumococcal 23 valent vaccine  0.5 mL Intramuscular Tomorrow-1000   Continuous Infusions: . 0.9 % NaCl with KCl 20 mEq / L 100 mL/hr at 10/14/16 1412  . azithromycin Stopped (10/15/16 1039)  . cefTRIAXone (ROCEPHIN)  IV Stopped (10/15/16 1234)   PRN Meds:.acetaminophen **OR** acetaminophen, benzonatate, guaiFENesin-codeine, ipratropium-albuterol, metoprolol tartrate, morphine injection, ondansetron **OR** ondansetron (ZOFRAN) IV, senna-docusate   Data Review:   Micro Results Recent Results (from the past 240 hour(s))  Blood culture (routine x 2)     Status: None (Preliminary result)   Collection Time: 10/14/16 12:13 PM  Result Value Ref Range Status   Specimen Description BLOOD BLOOD RIGHT ARM  Final   Special Requests   Final    BOTTLES DRAWN AEROBIC AND ANAEROBIC Blood Culture adequate volume   Culture NO GROWTH 1 DAY  Final   Report Status PENDING  Incomplete  Blood culture (routine x 2)     Status: None (Preliminary result)   Collection Time: 10/14/16 12:13 PM  Result Value Ref Range Status   Specimen Description BLOOD LEFT ANTECUBITAL  Final   Special Requests   Final    BOTTLES DRAWN AEROBIC AND ANAEROBIC Blood Culture adequate volume   Culture NO GROWTH 1 DAY  Final   Report Status PENDING  Incomplete    Radiology Reports Dg Chest 2 View  Result Date: 10/14/2016 CLINICAL DATA:  Nausea vomiting and diarrhea. Generalized body aches. EXAM: CHEST  2 VIEW COMPARISON:  06/13/2016 FINDINGS: The heart size appears normal. No pleural effusion or edema. Atelectasis and/or airspace consolidation within the lingula is identified. Thickening of  the minor fissure is noted. IMPRESSION: 1. Lingular atelectasis versus pneumonia. 2. Thickening of the minor fissure. Electronically Signed   By: Kerby Moors M.D.   On: 10/14/2016 09:45     CBC  Recent Labs Lab 10/14/16 0916 10/14/16 1400 10/15/16 0411  WBC 8.1 8.8 15.4*  HGB 16.7 16.0 15.4  HCT 47.5 45.9 44.9  PLT 233 234 229  MCV 85.7 86.3 86.4  MCH 30.2 30.1 29.6  MCHC 35.2 34.9 34.2  RDW 13.4 13.3 13.4    Chemistries   Recent Labs Lab 10/14/16 0916 10/14/16 1400 10/15/16 0411  NA 136  --  136  K 3.7  --  4.1  CL 100*  --  104  CO2 26  --  24  GLUCOSE 131*  --  163*  BUN 12  --  19  CREATININE 1.07 1.01 0.98  CALCIUM 9.7  --  9.2   ------------------------------------------------------------------------------------------------------------------ estimated creatinine clearance is 101.2 mL/min (by C-G formula based on SCr of 0.98 mg/dL). ------------------------------------------------------------------------------------------------------------------ No results for input(s): HGBA1C in the last 72 hours. ------------------------------------------------------------------------------------------------------------------ No results for input(s): CHOL, HDL, LDLCALC, TRIG, CHOLHDL, LDLDIRECT in the last 72 hours. ------------------------------------------------------------------------------------------------------------------ No results for input(s): TSH, T4TOTAL, T3FREE, THYROIDAB in the last 72 hours.  Invalid input(s): FREET3 ------------------------------------------------------------------------------------------------------------------ No results for input(s): VITAMINB12, FOLATE, FERRITIN, TIBC, IRON, RETICCTPCT in the last 72 hours.  Coagulation profile No results for input(s): INR, PROTIME in the last 168 hours.  No results for input(s): DDIMER in the last 72 hours.  Cardiac Enzymes  Recent Labs Lab 10/14/16 0916  TROPONINI 0.04*   ------------------------------------------------------------------------------------------------------------------ Invalid input(s): POCBNP    Assessment & Plan    Present on Admission: . Community acquired pneumonia -Continue therapy with Rocephin, azithromycin -Oxygen keep sats greater than 88% -I will add a cough syrup  . COPD with acute exacerbation (HCC) -Solu-Medrol 60 mg IV every 8 -Add Pulmicort  . Tobacco abuse -Smoking cessation provided total time spent 4 minutes I strongly recommended he stop smoking nicotine replacement will be given  . HTN (hypertension) -Stable, home medications renewed -. Blood pressure medications ordered  Elevated troponin -chronic, unchanged his last admission     Code Status Orders        Start     Ordered   10/14/16 1326  Full code  Continuous     10/14/16 1325    Code Status History    Date Active Date Inactive Code Status Order ID Comments User Context   01/24/2016  3:44 AM 01/24/2016  7:45 AM Full Code [id number]  Lance Coon, MD Inpatient           ConsultsNone   DVT Prophylaxis  Lovenox    Lab Results  Component Value Date   PLT 229 10/15/2016     Time Spent in minutes   35 minute  Greater than 50% of time spent in care coordination and counseling patient regarding the condition and plan of care.   Dustin Flock M.D on 10/15/2016 at 1:44 PM  Between 7am to 6pm - Pager - [phone]  After 6pm go to [email or web address] - password [email or web address] [facility]   Office  [phone]

## 2016-10-16 LAB — URINE CULTURE: Culture: NO GROWTH

## 2016-10-16 LAB — BASIC METABOLIC PANEL
ANION GAP: 6 (ref 5–15)
BUN: 18 mg/dL (ref 6–20)
CALCIUM: 9 mg/dL (ref 8.9–10.3)
CO2: 24 mmol/L (ref 22–32)
CREATININE: 0.78 mg/dL (ref 0.61–1.24)
Chloride: 106 mmol/L (ref 101–111)
GFR calc Af Amer: >60 mL/min (ref >60)
GLUCOSE: 138 mg/dL — AB (ref 65–99)
Potassium: 4.1 mmol/L (ref 3.5–5.1)
Sodium: 136 mmol/L (ref 135–145)

## 2016-10-16 LAB — CBC
HCT: 41 % (ref 40.0–52.0)
Hemoglobin: 14.2 g/dL (ref 13.0–18.0)
MCH: 30.4 pg (ref 26.0–34.0)
MCHC: 34.6 g/dL (ref 32.0–36.0)
MCV: 87.8 fL (ref 80.0–100.0)
PLATELETS: 221 10*3/uL (ref 150–440)
RBC: 4.67 MIL/uL (ref 4.40–5.90)
RDW: 13.3 % (ref 11.5–14.5)
WBC: 23.7 10*3/uL — AB (ref 3.8–10.6)

## 2016-10-16 MED ORDER — PREDNISONE 10 MG (21) PO TBPK: ORAL_TABLET | ORAL | 0 refills | Status: DC

## 2016-10-16 MED ORDER — LEVOFLOXACIN 500 MG PO TABS: 500.0000 mg | ORAL_TABLET | Freq: Every day | ORAL | Status: DC

## 2016-10-16 MED ORDER — GUAIFENESIN ER 600 MG PO TB12: 600.0000 mg | ORAL_TABLET | Freq: Two times a day (BID) | ORAL | 0 refills | Status: AC

## 2016-10-16 MED ORDER — LEVOFLOXACIN 500 MG PO TABS: 500.0000 mg | ORAL_TABLET | Freq: Every day | ORAL | 0 refills | Status: AC

## 2016-10-16 MED ORDER — ALBUTEROL SULFATE HFA 108 (90 BASE) MCG/ACT IN AERS: 2.0000 | INHALATION_SPRAY | Freq: Four times a day (QID) | RESPIRATORY_TRACT | 0 refills | Status: DC | PRN

## 2016-10-16 NOTE — Discharge Instructions (Signed)
[facility] [place] at [facility] ° °DIET:  °Cardiac diet ° °DISCHARGE CONDITION:  °Stable ° °ACTIVITY:  °Activity as tolerated ° °OXYGEN:  °Home Oxygen: No. °  °Oxygen Delivery: room air ° °DISCHARGE LOCATION:  °home  ° ° °ADDITIONAL DISCHARGE INSTRUCTION: stop smoking ° ° °If you experience worsening of your admission symptoms, develop shortness of breath, life threatening emergency, suicidal or homicidal thoughts you must seek medical attention immediately by calling 911 or calling your MD immediately  if symptoms less severe. ° °You Must read complete instructions/literature along with all the possible adverse reactions/side effects for all the Medicines you take and that have been prescribed to you. Take any new Medicines after you have completely understood and accpet all the possible adverse reactions/side effects.  ° °Please note ° °You were cared for by a hospitalist during your hospital stay. If you have any questions about your discharge medications or the care you received while you were in the hospital after you are discharged, you can call the unit and asked to speak with the hospitalist on call if the hospitalist that took care of you is not available. Once you are discharged, your primary care physician will handle any further medical issues. Please note that NO REFILLS for any discharge medications will be authorized once you are discharged, as it is imperative that you return to your primary care physician (or establish a relationship with a primary care physician if you do not have one) for your aftercare needs so that they can reassess your need for medications and monitor your lab values. ° ° °

## 2016-10-16 NOTE — Discharge Summary (Signed)
Christopher Harrell at [facility], [place] y.o., DOB [birth year], MRN [id number]. Admission date: 10/14/2016 Discharge Date 10/16/2016 Primary MD Patient, No Pcp Per Admitting Physician Quintella Baton, MD  Admission Diagnosis  Bronchospasm [J98.01] Community acquired pneumonia of left lung, unspecified part of lung [J18.9]  Discharge Diagnosis   Active Problems:   Community acquired pneumonia   HTN (hypertension)   Tobacco abuse   COPD with acute exacerbation [facility])   Pneumonia          Hospital Course   This is a 46 year old male who for the past 3 days has been lightheaded and dizzy. He has significant generalized body aches. He has been nauseous and vomiting, no hematemesis. He reports insomnia. He has a cough productive of a whitish phlegm. He has chills but no fevers. He's been stuffy and wheezing. Patient was seen in the emergency room and was evaluated was noted to have pneumonia he was admitted for further evaluation and therapy. He was started on antibiotics and treatment for COPD exacerbation. His breathing is improved. Patient feeling much better.         Consults  None  Significant Tests:  See full reports for all details    Dg Chest 2 View  Result Date: 10/14/2016 CLINICAL DATA:  Nausea vomiting and diarrhea. Generalized body aches. EXAM: CHEST  2 VIEW COMPARISON:  06/13/2016 FINDINGS: The heart size appears normal. No pleural effusion or edema. Atelectasis and/or airspace consolidation within the lingula is identified. Thickening of the minor fissure is noted. IMPRESSION: 1. Lingular atelectasis versus pneumonia. 2. Thickening of the minor fissure. Electronically Signed   By: Kerby Moors M.D.   On: 10/14/2016 09:45       Today   Subjective:   Christopher Harrell  feels better shortness of breath improved  Objective:   Blood pressure 132/87, pulse 83, temperature 99 F (37.2 C), temperature source Oral, resp. rate 16, height 5'  8" (1.727 m), weight 197 lb (89.4 kg), SpO2 95 %.  .  Intake/Output Summary (Last 24 hours) at 10/16/16 1327 Last data filed at 10/16/16 1022  Gross per 24 hour  Intake              360 ml  Output             2775 ml  Net            -2415 ml    Exam VITAL SIGNS: Blood pressure 132/87, pulse 83, temperature 99 F (37.2 C), temperature source Oral, resp. rate 16, height 5\' 8"  (1.727 m), weight 197 lb (89.4 kg), SpO2 95 %.  GENERAL:  46 y.o.-year-old patient lying in the bed with no acute distress.  EYES: Pupils equal, round, reactive to light and accommodation. No scleral icterus. Extraocular muscles intact.  HEENT: Head atraumatic, normocephalic. Oropharynx and nasopharynx clear.  NECK:  Supple, no jugular venous distention. No thyroid enlargement, no tenderness.  LUNGS: Occasional wheezing  CARDIOVASCULAR: S1, S2 normal. No murmurs, rubs, or gallops.  ABDOMEN: Soft, nontender, nondistended. Bowel sounds present. No organomegaly or mass.  EXTREMITIES: No pedal edema, cyanosis, or clubbing.  NEUROLOGIC: Cranial nerves II through XII are intact. Muscle strength 5/5 in all extremities. Sensation intact. Gait not checked.  PSYCHIATRIC: The patient is alert and oriented x 3.  SKIN: No obvious rash, lesion, or ulcer.   Data Review     CBC w Diff: Lab Results  Component Value Date   WBC 23.7 (H) 10/16/2016  HGB 14.2 10/16/2016   HGB 16.2 06/13/2016   HCT 41.0 10/16/2016   HCT 46.9 06/13/2016   PLT 221 10/16/2016   CMP: Lab Results  Component Value Date   NA 136 10/16/2016   NA 142 06/13/2016   K 4.1 10/16/2016   CL 106 10/16/2016   CO2 24 10/16/2016   BUN 18 10/16/2016   BUN 10 06/13/2016   CREATININE 0.78 10/16/2016   PROT 7.1 06/13/2016   ALBUMIN 4.4 06/13/2016   BILITOT 0.7 06/13/2016   ALKPHOS 67 06/13/2016   AST 16 06/13/2016   ALT 25 06/13/2016  .  Micro Results Recent Results (from the past 240 hour(s))  Blood culture (routine x 2)     Status: None  (Preliminary result)   Collection Time: 10/14/16 12:13 PM  Result Value Ref Range Status   Specimen Description BLOOD BLOOD RIGHT ARM  Final   Special Requests   Final    BOTTLES DRAWN AEROBIC AND ANAEROBIC Blood Culture adequate volume   Culture NO GROWTH 2 DAYS  Final   Report Status PENDING  Incomplete  Blood culture (routine x 2)     Status: None (Preliminary result)   Collection Time: 10/14/16 12:13 PM  Result Value Ref Range Status   Specimen Description BLOOD LEFT ANTECUBITAL  Final   Special Requests   Final    BOTTLES DRAWN AEROBIC AND ANAEROBIC Blood Culture adequate volume   Culture NO GROWTH 2 DAYS  Final   Report Status PENDING  Incomplete  Urine culture     Status: None   Collection Time: 10/14/16  3:38 PM  Result Value Ref Range Status   Specimen Description URINE, RANDOM  Final   Special Requests NONE  Final   Culture   Final    NO GROWTH Performed at [facility], [place] [address], [place], [place] [address]    Report Status 10/16/2016 FINAL  Final        Code Status Orders        Start     Ordered   10/14/16 1326  Full code  Continuous     10/14/16 1325    Code Status History    Date Active Date Inactive Code Status Order ID Comments User Context   01/24/2016  3:44 AM 01/24/2016  7:45 AM Full Code [id number]  Lance Coon, MD Inpatient          Follow-up Information    pcp if none [facility] Follow up in 1 week(s).           Discharge Medications   Allergies as of 10/16/2016   No Known Allergies     Medication List    TAKE these medications   albuterol 108 (90 Base) MCG/ACT inhaler Commonly known as:  PROVENTIL HFA;VENTOLIN HFA Inhale 2 puffs into the lungs every 6 (six) hours as needed for wheezing or shortness of breath.   amLODipine 10 MG tablet Commonly known as:  NORVASC Take 1 tablet (10 mg total) by mouth daily.   guaiFENesin 600 MG 12 hr tablet Commonly known as:  MUCINEX Take 1 tablet (600 mg total) by mouth  2 (two) times daily.   levofloxacin 500 MG tablet Commonly known as:  LEVAQUIN Take 1 tablet (500 mg total) by mouth daily.   lisinopril-hydrochlorothiazide 20-12.5 MG tablet Commonly known as:  PRINZIDE,ZESTORETIC Take 1 tablet by mouth daily.   predniSONE 10 MG (21) Tbpk tablet Commonly known as:  STERAPRED UNI-PAK 21 TAB Start at 60mg   taper by 10mg  until complete          Total Time in preparing paper work, data evaluation and todays exam - 35 minutes  Dustin Flock M.D on 10/16/2016 at 1:27 PM  [facility]   Office  [phone]

## 2016-10-16 NOTE — Care Management Note (Signed)
Case Management Note  Patient Details  Name: Christopher Harrell MRN: [id number] Date of Birth: 05/22/1970  Subjective/Objective:  Met with patient at bedside to discuss uninsured status. Patient is active with [facility]. He is asking for a cab voucher since he states he does not have a ride home. Informed him we did not provide cab voucher. CSW updated for possible additional resources.                  Action/Plan:   Expected Discharge Date:  10/16/16               Expected Discharge Plan:  Home/Self Care  In-House Referral:     Discharge planning Services  CM Consult, [facility], Medication Assistance  Post Acute Care Choice:    Choice offered to:     DME Arranged:    DME Agency:     HH Arranged:    HH Agency:     Status of Service:  Completed, signed off  If discussed at [employer], dates discussed:    Additional Comments:  Jolly Mango, RN 10/16/2016, 11:10 AM

## 2016-10-16 NOTE — Progress Notes (Signed)
Shift assessment completed. Pt is awake, alert and oriented, in no distress, respirations are unlabored, pt has expiratory wheezes bilat. Hr is regular, abdomen is soft, bs heard. Pt has urinal in reach. PIV #20 intact to l hand with iv ns with20 meq kcl infusing at 165mls/hr, site free of redness and swelling. Ppp, no edema noted.

## 2016-10-16 NOTE — Progress Notes (Signed)
Dr. Posey Pronto has rounded on pt, pt is to be dc'd. This Probation officer removed pt's piv with catheter intact, pt tolerated well. Pt has received scripts, taxi voucher, work note, and d/c instructions, pt verbalized understanding. Pt is waiting for taxi to arrive at this time.

## 2016-10-16 NOTE — Progress Notes (Signed)
Clinical Education officer, museum (CSW) received verbal consult from RN case manager that patient is discharging home today and needs a ride. CSW met with patient alone at bedside to address consult. Patient was alert and oriented X4 and stated that he is independent with his ADL's and works at [employer] in [place]. Patient reported that he just moved to [place] from [place] and is not familiar with the bus system. Patient reported that he gives a friend money to drive him to and from work. Per patient his friend does not get off work until late tonight and can't transport him. Patient reported that he has no money to pay for a taxi today. Due to the heat index today and patient is not familiar with the bus system a taxi voucher will be provided. RN aware of above. Please reconsult if future social work needs arise. CSW signing off.   [employer], LCSW [phone]

## 2016-10-16 NOTE — Plan of Care (Signed)
Problem: Pain Management: Goal: Expressions of feelings of enhanced comfort will increase Outcome: Completed/Met Date Met: 10/16/16 Pt has met goals for discharge.

## 2016-10-16 NOTE — Discharge Summary (Signed)
Christopher Harrell at [place] was admitted to the Hospital on 10/14/2016 and Discharged  10/16/2016 and should be excused from work/school   for 7  days starting 10/14/2016 , may return to work/school without any restrictions.  Call Dustin Flock MD with questions.  Dustin Flock M.D on 10/16/2016,at 10:49 AM  [place] at [facility]  [phone]

## 2016-10-17 LAB — IMMUNOGLOBULINS A/E/G/M, SERUM
IGA: 171 mg/dL (ref 90–386)
IGM, SERUM: 26 mg/dL (ref 20–172)
IgE (Immunoglobulin E), Serum: 28 IU/mL (ref 0–100)
IgG (Immunoglobin G), Serum: 928 mg/dL (ref 700–1600)

## 2016-10-19 LAB — CULTURE, BLOOD (ROUTINE X 2)
CULTURE: NO GROWTH
Culture: NO GROWTH
Special Requests: ADEQUATE
Special Requests: ADEQUATE

## 2016-10-26 ENCOUNTER — Other Ambulatory Visit: Payer: Self-pay | Admitting: Nurse Practitioner

## 2016-11-29 ENCOUNTER — Other Ambulatory Visit: Payer: Self-pay | Admitting: Adult Health Nurse Practitioner

## 2016-12-14 ENCOUNTER — Ambulatory Visit: Payer: Self-pay

## 2017-01-02 ENCOUNTER — Ambulatory Visit: Payer: Self-pay | Admitting: Urology

## 2017-01-02 VITALS — BP 166/112 | HR 89 | Temp 98.4°F | Wt 204.0 lb

## 2017-01-02 DIAGNOSIS — Z720 Tobacco use: Secondary | ICD-10-CM

## 2017-01-02 DIAGNOSIS — D72828 Other elevated white blood cell count: Secondary | ICD-10-CM

## 2017-01-02 MED ORDER — ALBUTEROL SULFATE HFA 108 (90 BASE) MCG/ACT IN AERS: 2.0000 | INHALATION_SPRAY | Freq: Four times a day (QID) | RESPIRATORY_TRACT | 0 refills | Status: DC | PRN

## 2017-01-02 MED ORDER — AMLODIPINE BESYLATE 10 MG PO TABS: 10.0000 mg | ORAL_TABLET | Freq: Every day | ORAL | 3 refills | Status: DC

## 2017-01-02 MED ORDER — NICOTINE 10 MG IN INHA: 1.0000 | RESPIRATORY_TRACT | 0 refills | Status: DC | PRN

## 2017-01-02 MED ORDER — LISINOPRIL-HYDROCHLOROTHIAZIDE 20-12.5 MG PO TABS: 1.0000 | ORAL_TABLET | Freq: Every day | ORAL | 3 refills | Status: DC

## 2017-01-02 NOTE — Progress Notes (Signed)
   Subjective:    Patient ID: Christopher Harrell, male    DOB: 18-Oct-1970, 46 y.o.   MRN: [id number]  HPI   Pt here for med refill. He has been completely out for a few weeks.  Pt recently hospitalized for Pneumonia on 10/14/16. Pt reports he feels well. Pt reports he was told to go see a internist for his kidneys but he has never gone.  Pt is current smoker.    Patient Active Problem List   Diagnosis Date Noted  . COPD with acute exacerbation ([place]) 10/14/2016  . Pneumonia 10/14/2016  . Hypertensive urgency, malignant 06/13/2016  . Tobacco abuse 06/13/2016  . Community acquired pneumonia 01/23/2016  . Elevated troponin 01/23/2016  . HTN (hypertension) 01/23/2016  . Chest pain 01/23/2016   Allergies as of 01/02/2017   No Known Allergies     Medication List       Accurate as of 01/02/17  7:09 PM. Always use your most recent med list.          albuterol 108 (90 Base) MCG/ACT inhaler Commonly known as:  PROVENTIL HFA;VENTOLIN HFA Inhale 2 puffs into the lungs every 6 (six) hours as needed for wheezing or shortness of breath.   amLODipine 10 MG tablet Commonly known as:  NORVASC take 1 tablet by mouth once daily   lisinopril-hydrochlorothiazide 20-12.5 MG tablet Commonly known as:  PRINZIDE,ZESTORETIC Take 1 tablet by mouth daily.   predniSONE 10 MG (21) Tbpk tablet Commonly known as:  STERAPRED UNI-PAK 21 TAB Start at 60mg  taper by 10mg  until complete        Review of Systems Pt needs albuterol refilled for some SOB from Pneumonia.  BP is elevated but Pt has been out of meds for a few weeks.  Pt reports he is experiencing blurry vision and has trouble reading small print.  Smoking cessation discussed. Pt agrees to try to quit with [place].     Objective:   Physical Exam  Constitutional: He is oriented to person, place, and time. He appears well-developed and well-nourished.  Cardiovascular: Normal rate, regular rhythm and normal heart sounds.   Pulmonary/Chest:  Effort normal and breath sounds normal.  Neurological: He is alert and oriented to person, place, and time.    BP (!) 166/112   Pulse 89   Temp 98.4 F (36.9 C)   Wt 204 lb (92.5 kg)   BMI 31.02 kg/m      Assessment & Plan:   Labs today: CBC Referral for eye exam. Referral for dental exam.  F/u in 2 weeks for BP check.

## 2017-01-03 LAB — CBC WITH DIFFERENTIAL/PLATELET
BASOS ABS: 0.1 10*3/uL (ref 0.0–0.2)
Basos: 1 %
EOS (ABSOLUTE): 0.3 10*3/uL (ref 0.0–0.4)
Eos: 4 %
Hematocrit: 45.4 % (ref 37.5–51.0)
Hemoglobin: 15.4 g/dL (ref 13.0–17.7)
Immature Grans (Abs): 0 10*3/uL (ref 0.0–0.1)
Immature Granulocytes: 0 %
LYMPHS ABS: 2.8 10*3/uL (ref 0.7–3.1)
Lymphs: 39 %
MCH: 30.9 pg (ref 26.6–33.0)
MCHC: 33.9 g/dL (ref 31.5–35.7)
MCV: 91 fL (ref 79–97)
MONOS ABS: 0.7 10*3/uL (ref 0.1–0.9)
Monocytes: 10 %
Neutrophils Absolute: 3.3 10*3/uL (ref 1.4–7.0)
Neutrophils: 46 %
Platelets: 277 10*3/uL (ref 150–379)
RBC: 4.99 x10E6/uL (ref 4.14–5.80)
RDW: 15.1 % (ref 12.3–15.4)
WBC: 7.2 10*3/uL (ref 3.4–10.8)

## 2017-01-18 ENCOUNTER — Ambulatory Visit: Payer: Self-pay | Admitting: Ophthalmology

## 2017-01-25 ENCOUNTER — Ambulatory Visit: Payer: Self-pay

## 2017-01-25 ENCOUNTER — Ambulatory Visit: Payer: Self-pay | Admitting: Ophthalmology

## 2017-02-20 ENCOUNTER — Ambulatory Visit: Payer: Self-pay

## 2017-05-29 ENCOUNTER — Ambulatory Visit: Payer: Self-pay

## 2017-06-05 ENCOUNTER — Ambulatory Visit: Payer: Medicaid Other | Admitting: Adult Health Nurse Practitioner

## 2017-06-05 VITALS — BP 152/91 | HR 84 | Temp 98.4°F | Wt 214.1 lb

## 2017-06-05 DIAGNOSIS — I10 Essential (primary) hypertension: Secondary | ICD-10-CM

## 2017-06-05 DIAGNOSIS — E781 Pure hyperglyceridemia: Secondary | ICD-10-CM | POA: Insufficient documentation

## 2017-06-05 MED ORDER — AMLODIPINE BESYLATE 10 MG PO TABS: 10.0000 mg | ORAL_TABLET | Freq: Every day | ORAL | 0 refills | Status: DC

## 2017-06-05 MED ORDER — LISINOPRIL-HYDROCHLOROTHIAZIDE 20-12.5 MG PO TABS: 1.0000 | ORAL_TABLET | Freq: Every day | ORAL | 0 refills | Status: DC

## 2017-06-05 NOTE — Patient Instructions (Signed)
Cholesterol Cholesterol is a fat. Your body needs a small amount of cholesterol. Cholesterol (plaque) may build up in your blood vessels (arteries). That makes you more likely to have a heart attack or stroke. You cannot feel your cholesterol level. Having a blood test is the only way to find out if your level is high. Keep your test results. Work with your doctor to keep your cholesterol at a good level. What do the results mean?  Total cholesterol is how much cholesterol is in your blood.  LDL is bad cholesterol. This is the type that can build up. Try to have low LDL.  HDL is good cholesterol. It cleans your blood vessels and carries LDL away. Try to have high HDL.  Triglycerides are fat that the body can store or burn for energy. What are good levels of cholesterol?  Total cholesterol below 200.  LDL below 100 is good for people who have health risks. LDL below 70 is good for people who have very high risks.  HDL above 40 is good. It is best to have HDL of 60 or higher.  Triglycerides below 150. How can I lower my cholesterol? Diet Follow your diet program as told by your doctor.  Choose fish, white meat chicken, or turkey that is roasted or baked. Try not to eat red meat, fried foods, sausage, or lunch meats.  Eat lots of fresh fruits and vegetables.  Choose whole grains, beans, pasta, potatoes, and cereals.  Choose olive oil, corn oil, or canola oil. Only use small amounts.  Try not to eat butter, mayonnaise, shortening, or palm kernel oils.  Try not to eat foods with trans fats.  Choose low-fat or nonfat dairy foods. ? Drink skim or nonfat milk. ? Eat low-fat or nonfat yogurt and cheeses. ? Try not to drink whole milk or cream. ? Try not to eat ice cream, egg yolks, or full-fat cheeses.  Healthy desserts include angel food cake, ginger snaps, animal crackers, hard candy, popsicles, and low-fat or nonfat frozen yogurt. Try not to eat pastries, cakes, pies, and  cookies.  Exercise Follow your exercise program as told by your doctor.  Be more active. Try gardening, walking, and taking the stairs.  Ask your doctor about ways that you can be more active.  Medicine  Take over-the-counter and prescription medicines only as told by your doctor. This information is not intended to replace advice given to you by your health care provider. Make sure you discuss any questions you have with your health care provider. Document Released: 07/14/2008 Document Revised: 11/17/2015 Document Reviewed: 10/28/2015 Elsevier Interactive Patient Education  2018 Elsevier Inc.  

## 2017-06-05 NOTE — Progress Notes (Signed)
  Patient: Christopher Harrell Male    DOB: Jul 11, 1970   47 y.o.   MRN: [id number] Visit Date: 06/05/2017  Today's Provider: Staci Acosta, NP   Chief Complaint  Patient presents with  . Hypertension  . Arm Pain    lower right arm   . Eye Strain    wants eyes checked    Subjective:    HPI  Pt states that he has not been taking his BP medications for 3 months.     Pt states that he has a stabbing pain in his R arm for the past month. States that he has trouble using the arm to lift. Denies injury.  Pt states that he has not taking anything for the pain.   No Known Allergies Previous Medications   ALBUTEROL (PROVENTIL HFA;VENTOLIN HFA) 108 (90 BASE) MCG/ACT INHALER    Inhale 2 puffs into the lungs every 6 (six) hours as needed for wheezing or shortness of breath.   AMLODIPINE (NORVASC) 10 MG TABLET    Take 1 tablet (10 mg total) by mouth daily.   LISINOPRIL-HYDROCHLOROTHIAZIDE (PRINZIDE,ZESTORETIC) 20-12.5 MG TABLET    Take 1 tablet by mouth daily.   NICOTINE (NICOTROL) 10 MG INHALER    Inhale 1 Cartridge (1 continuous puffing total) into the lungs as needed for smoking cessation.   PREDNISONE (STERAPRED UNI-PAK 21 TAB) 10 MG (21) TBPK TABLET    Start at 60mg  taper by 10mg  until complete    Review of Systems  All other systems reviewed and are negative.   Social History   Tobacco Use  . Smoking status: Current Every Day Smoker    Packs/day: 0.50    Types: Cigarettes  . Smokeless tobacco: Never Used  Substance Use Topics  . Alcohol use: No   Objective:   BP (!) 152/91   Pulse 84   Temp 98.4 F (36.9 C)   Wt 214 lb 1.6 oz (97.1 kg)   BMI 32.55 kg/m   Physical Exam  Constitutional: He appears well-developed and well-nourished.  HENT:  Head: Normocephalic and atraumatic.  Eyes: Pupils are equal, round, and reactive to light.  Neck: Normal range of motion. Neck supple.  Cardiovascular: Normal rate, normal heart sounds and intact distal pulses.  Pulmonary/Chest:  Effort normal and breath sounds normal.  Abdominal: Soft. Bowel sounds are normal.  Musculoskeletal: Normal range of motion. He exhibits no edema.  Skin: Skin is warm and dry.  Vitals reviewed.       Assessment & Plan:        Referral for eye exam.   HTN:  Not controlled.  Goal BP <140/90.  Restart medication regimen.  Encourage low salt diet and exercise.   Arm pain:  Rest, heat and ice rotation.  Ibuprofen 600mg  every 8 hours scheduled for about 4 days.   FU in 4 weeks for BP and lab review.   Staci Acosta, NP   [facility]

## 2017-06-06 LAB — COMPREHENSIVE METABOLIC PANEL
ALK PHOS: 65 IU/L (ref 39–117)
ALT: 21 IU/L (ref 0–44)
AST: 17 IU/L (ref 0–40)
Albumin/Globulin Ratio: 2 (ref 1.2–2.2)
Albumin: 4.7 g/dL (ref 3.5–5.5)
BILIRUBIN TOTAL: 0.6 mg/dL (ref 0.0–1.2)
BUN/Creatinine Ratio: 14 (ref 9–20)
BUN: 15 mg/dL (ref 6–24)
CHLORIDE: 103 mmol/L (ref 96–106)
CO2: 25 mmol/L (ref 20–29)
Calcium: 9.7 mg/dL (ref 8.7–10.2)
Creatinine, Ser: 1.04 mg/dL (ref 0.76–1.27)
GFR calc Af Amer: 98 mL/min/{1.73_m2} (ref >59)
GFR calc non Af Amer: 85 mL/min/{1.73_m2} (ref >59)
GLUCOSE: 132 mg/dL — AB (ref 65–99)
Globulin, Total: 2.4 g/dL (ref 1.5–4.5)
Potassium: 3.9 mmol/L (ref 3.5–5.2)
Sodium: 144 mmol/L (ref 134–144)
Total Protein: 7.1 g/dL (ref 6.0–8.5)

## 2017-06-06 LAB — LIPID PANEL
CHOLESTEROL TOTAL: 240 mg/dL — AB (ref 100–199)
Chol/HDL Ratio: 5 ratio (ref 0.0–5.0)
HDL: 48 mg/dL (ref >39)
Triglycerides: 410 mg/dL — ABNORMAL HIGH (ref 0–149)

## 2017-06-06 LAB — LDL CHOLESTEROL, DIRECT: LDL Direct: 123 mg/dL — ABNORMAL HIGH (ref 0–99)

## 2017-07-05 ENCOUNTER — Ambulatory Visit: Payer: Medicaid Other

## 2017-09-25 ENCOUNTER — Emergency Department: Payer: Self-pay

## 2017-09-25 ENCOUNTER — Emergency Department
Admission: EM | Admit: 2017-09-25 | Discharge: 2017-09-25 | Disposition: A | Discharge status: Home / Self Care | Payer: Self-pay | Attending: Emergency Medicine | Admitting: Emergency Medicine

## 2017-09-25 ENCOUNTER — Encounter: Payer: Self-pay | Admitting: Emergency Medicine

## 2017-09-25 ENCOUNTER — Other Ambulatory Visit: Payer: Self-pay

## 2017-09-25 DIAGNOSIS — R1084 Generalized abdominal pain: Secondary | ICD-10-CM | POA: Insufficient documentation

## 2017-09-25 DIAGNOSIS — E860 Dehydration: Secondary | ICD-10-CM | POA: Insufficient documentation

## 2017-09-25 DIAGNOSIS — F17210 Nicotine dependence, cigarettes, uncomplicated: Secondary | ICD-10-CM | POA: Insufficient documentation

## 2017-09-25 DIAGNOSIS — Z79899 Other long term (current) drug therapy: Secondary | ICD-10-CM | POA: Insufficient documentation

## 2017-09-25 DIAGNOSIS — K852 Alcohol induced acute pancreatitis without necrosis or infection: Secondary | ICD-10-CM | POA: Insufficient documentation

## 2017-09-25 DIAGNOSIS — J449 Chronic obstructive pulmonary disease, unspecified: Secondary | ICD-10-CM | POA: Insufficient documentation

## 2017-09-25 DIAGNOSIS — I10 Essential (primary) hypertension: Secondary | ICD-10-CM | POA: Insufficient documentation

## 2017-09-25 LAB — BASIC METABOLIC PANEL
Anion gap: 6 (ref 5–15)
BUN: 14 mg/dL (ref 6–20)
CO2: 26 mmol/L (ref 22–32)
Calcium: 9.2 mg/dL (ref 8.9–10.3)
Chloride: 104 mmol/L (ref 101–111)
Creatinine, Ser: 0.86 mg/dL (ref 0.61–1.24)
GFR calc Af Amer: >60 mL/min (ref >60)
GLUCOSE: 104 mg/dL — AB (ref 65–99)
Potassium: 4.1 mmol/L (ref 3.5–5.1)
Sodium: 136 mmol/L (ref 135–145)

## 2017-09-25 LAB — URINALYSIS, COMPLETE (UACMP) WITH MICROSCOPIC
Bacteria, UA: NONE SEEN
Bilirubin Urine: NEGATIVE
GLUCOSE, UA: NEGATIVE mg/dL
KETONES UR: NEGATIVE mg/dL
Leukocytes, UA: NEGATIVE
Nitrite: NEGATIVE
PROTEIN: NEGATIVE mg/dL
Specific Gravity, Urine: >1.046 — ABNORMAL HIGH (ref 1.005–1.030)
WBC, UA: NONE SEEN WBC/hpf (ref 0–5)
pH: 6 (ref 5.0–8.0)

## 2017-09-25 LAB — CBC
HEMATOCRIT: 46.7 % (ref 40.0–52.0)
Hemoglobin: 16.4 g/dL (ref 13.0–18.0)
MCH: 31 pg (ref 26.0–34.0)
MCHC: 35.1 g/dL (ref 32.0–36.0)
MCV: 88.3 fL (ref 80.0–100.0)
Platelets: 237 10*3/uL (ref 150–440)
RBC: 5.29 MIL/uL (ref 4.40–5.90)
RDW: 13.4 % (ref 11.5–14.5)
WBC: 7 10*3/uL (ref 3.8–10.6)

## 2017-09-25 LAB — HEPATIC FUNCTION PANEL
ALBUMIN: 4 g/dL (ref 3.5–5.0)
ALT: 18 U/L (ref 17–63)
AST: 21 U/L (ref 15–41)
Alkaline Phosphatase: 65 U/L (ref 38–126)
BILIRUBIN INDIRECT: 1 mg/dL — AB (ref 0.3–0.9)
BILIRUBIN TOTAL: 1.1 mg/dL (ref 0.3–1.2)
Bilirubin, Direct: 0.1 mg/dL (ref 0.1–0.5)
Total Protein: 7.5 g/dL (ref 6.5–8.1)

## 2017-09-25 LAB — LIPASE, BLOOD: LIPASE: 157 U/L — AB (ref 11–51)

## 2017-09-25 MED ORDER — IOPAMIDOL (ISOVUE-370) INJECTION 76%
100.0000 mL | Freq: Once | INTRAVENOUS | Status: AC | PRN
  Administered 2017-09-25: 100 mL via INTRAVENOUS

## 2017-09-25 MED ORDER — ONDANSETRON HCL 4 MG/2ML IJ SOLN
4.0000 mg | Freq: Once | INTRAMUSCULAR | Status: AC
  Administered 2017-09-25: 4 mg via INTRAVENOUS
  Filled 2017-09-25: qty 2

## 2017-09-25 MED ORDER — SODIUM CHLORIDE 0.9 % IV BOLUS
1000.0000 mL | Freq: Once | INTRAVENOUS | Status: AC
  Administered 2017-09-25: 1000 mL via INTRAVENOUS

## 2017-09-25 MED ORDER — ONDANSETRON 4 MG PO TBDP: 4.0000 mg | ORAL_TABLET | Freq: Three times a day (TID) | ORAL | 0 refills | Status: DC | PRN

## 2017-09-25 MED ORDER — HYDROMORPHONE HCL 1 MG/ML IJ SOLN
1.0000 mg | Freq: Once | INTRAMUSCULAR | Status: AC
  Administered 2017-09-25: 1 mg via INTRAVENOUS
  Filled 2017-09-25: qty 1

## 2017-09-25 MED ORDER — NAPROXEN 500 MG PO TABS: 500.0000 mg | ORAL_TABLET | Freq: Two times a day (BID) | ORAL | 0 refills | Status: DC

## 2017-09-25 NOTE — ED Triage Notes (Addendum)
Pt reports having side pain and flank pain for the past few days, pt states it feels like it is his kidneys.  Pt denies any pain with urination.  Pt states the pain has made it difficult to sleep as well.    Pt also c/o rib pain states feels similar to when he had PNA last year. Pt states the pain is so bad it makes him short of breath

## 2017-09-25 NOTE — ED Provider Notes (Signed)
[facility] Emergency Department Provider Note  ____________________________________________  Time seen: Approximately 12:59 PM  I have reviewed the triage vital signs and the nursing notes.   HISTORY  Chief Complaint Flank Pain and Abdominal Pain    HPI Christopher Harrell is a 47 y.o. male with a history of hypertension who complains of left flank pain radiating of the back all the way to his left shoulder. No paresthesias or weakness. Gradual onset, constant, severe. worsened by change in position and movement. No alleviating factors.  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  No dizziness or syncope. No chest pain. No shortness of breath.  Also notes that over the weekend, a few hours before his symptoms started, he had been having several shots of liquor.      Past Medical History:  Diagnosis Date  . Community acquired pneumonia    a. Dx 01/21/2016 - [facility] PNA.  . Essential hypertension   . Tobacco abuse      Patient Active Problem List   Diagnosis Date Noted  . Hypertriglyceridemia 06/05/2017  . COPD with acute exacerbation ([place]) 10/14/2016  . Pneumonia 10/14/2016  . Hypertensive urgency, malignant 06/13/2016  . Tobacco abuse 06/13/2016  . Community acquired pneumonia 01/23/2016  . Elevated troponin 01/23/2016  . HTN (hypertension) 01/23/2016  . Chest pain 01/23/2016     Past Surgical History:  Procedure Laterality Date  . NO PAST SURGERIES       Prior to Admission medications   Medication Sig Start Date End Date Taking? Authorizing Provider  albuterol (PROVENTIL HFA;VENTOLIN HFA) 108 (90 Base) MCG/ACT inhaler Inhale 2 puffs into the lungs every 6 (six) hours as needed for wheezing or shortness of breath. 01/02/17   Zara Council A, PA-C  amLODipine (NORVASC) 10 MG tablet Take 1 tablet (10 mg total) by mouth daily. Patient not taking: Reported on  09/25/2017 06/05/17   Doles-Johnson, Teah, NP  lisinopril-hydrochlorothiazide (PRINZIDE,ZESTORETIC) 20-12.5 MG tablet Take 1 tablet by mouth daily. Patient not taking: Reported on 09/25/2017 06/05/17   Doles-Johnson, Teah, NP  naproxen (NAPROSYN) 500 MG tablet Take 1 tablet (500 mg total) by mouth 2 (two) times daily with a meal. 09/25/17   Carrie Mew, MD  nicotine (NICOTROL) 10 MG inhaler Inhale 1 Cartridge (1 continuous puffing total) into the lungs as needed for smoking cessation. Patient not taking: Reported on 09/25/2017 01/02/17   Zara Council A, PA-C  ondansetron (ZOFRAN ODT) 4 MG disintegrating tablet Take 1 tablet (4 mg total) by mouth every 8 (eight) hours as needed for nausea or vomiting. 09/25/17   Carrie Mew, MD  predniSONE (STERAPRED UNI-PAK 21 TAB) 10 MG (21) TBPK tablet Start at 60mg  taper by 10mg  until complete Patient not taking: Reported on 09/25/2017 10/16/16   Dustin Flock, MD     Allergies Patient has no known allergies.   Family History  Problem Relation Age of Onset  . Hypertension Mother   . Hypertension Father   . Heart attack Maternal Grandmother     Social History Social History   Tobacco Use  . Smoking status: Current Every Day Smoker    Packs/day: 0.50    Types: Cigarettes  . Smokeless tobacco: Never Used  Substance Use Topics  . Alcohol use: No  . Drug use: No    Review of Systems  Constitutional:   No fever or chills.  ENT:   No sore throat. No  rhinorrhea. Cardiovascular:   No chest pain or syncope. Respiratory:   No dyspnea or cough. Gastrointestinal:   Negative for abdominal pain, vomiting and diarrhea.  Musculoskeletal:   positive back pain as above All other systems reviewed and are negative except as documented above in ROS and HPI.  ____________________________________________   PHYSICAL EXAM:  VITAL SIGNS: ED Triage Vitals  Enc Vitals Group     BP 09/25/17 0850 (!) 149/97     Pulse Rate 09/25/17 0850 80     Resp  09/25/17 0850 17     Temp 09/25/17 0850 (!) 97.5 F (36.4 C)     Temp Source 09/25/17 0850 Oral     SpO2 09/25/17 0850 98 %     Weight 09/25/17 0851 200 lb (90.7 kg)     Height 09/25/17 0851 5\' 9"  (1.753 m)     Head Circumference --      Peak Flow --      Pain Score 09/25/17 0902 9     Pain Loc --      Pain Edu? --      Excl. in [place]? --     Vital signs reviewed, nursing assessments reviewed.   Constitutional:   Alert and oriented. uncomfortable, not in distress Eyes:   Conjunctivae are normal. EOMI. PERRL. ENT      Head:   Normocephalic and atraumatic.      Nose:   No congestion/rhinnorhea.       Mouth/Throat:   dry mucous membranes, no pharyngeal erythema. No peritonsillar mass.       Neck:   No meningismus. Full ROM. Hematological/Lymphatic/Immunilogical:   No cervical lymphadenopathy. Cardiovascular:   RRR. Symmetric bilateral radial and DP pulses.  No murmurs.  Respiratory:   Normal respiratory effort without tachypnea/retractions. Breath sounds are clear and equal bilaterally. No wheezes/rales/rhonchi. Gastrointestinal:   Soft with epigastric and left upper quadrant tenderness. Non distended. There is no CVA tenderness.  No rebound, rigidity, or guarding. Genitourinary:   deferred Musculoskeletal:   Normal range of motion in all extremities. No joint effusions.  No lower extremity tenderness.  No edema.and no midline spinal tenderness. There is tenderness in the left lower back along the musculature. Neurologic:   Normal speech and language.  Motor grossly intact. No acute focal neurologic deficits are appreciated.  Skin:    Skin is warm, dry and intact. No rash noted.  No petechiae, purpura, or bullae.  ____________________________________________    LABS (pertinent positives/negatives) (all labs ordered are listed, but only abnormal results are displayed) Labs Reviewed  BASIC METABOLIC PANEL - Abnormal; Notable for the following components:      Result Value    Glucose, Bld 104 (*)    All other components within normal limits  HEPATIC FUNCTION PANEL - Abnormal; Notable for the following components:   Indirect Bilirubin 1.0 (*)    All other components within normal limits  LIPASE, BLOOD - Abnormal; Notable for the following components:   Lipase 157 (*)    All other components within normal limits  CBC  URINALYSIS, COMPLETE (UACMP) WITH MICROSCOPIC   ____________________________________________   EKG  interpreted by me Normal sinus rhythm rate of 80, normal axis and intervals. Normal QRS ST segments and T waves.  ____________________________________________    RADIOLOGY  Dg Chest 2 View  Result Date: 09/25/2017 CLINICAL DATA:  Side and flank pain for the past several days. Patient also reports ribcage discomfort similar to that experience during an episode of pneumonia last year. Some  pleuritic component and shortness of breath. Current smoker. History of COPD. EXAM: CHEST - 2 VIEW COMPARISON:  Chest x-ray of October 14, 2016 FINDINGS: The lungs are adequately inflated. The interstitial markings are coarse though considerably improved as compared to the previous study. There is an area of patchy density lateral to the cardiac apex not visible on the lateral view. The heart and pulmonary vascularity are normal. The mediastinum is normal in width. No acute or old rib fractures are observed. IMPRESSION: COPD. Mild pulmonary fibrotic-smoking related changes. Possible subsegmental atelectasis in the lingula. No pneumonia nor CHF. Electronically Signed   By: David  Martinique M.D.   On: 09/25/2017 09:23   Ct Angio Chest/abd/pel For Dissection W And/or Wo Contrast  Result Date: 09/25/2017 CLINICAL DATA:  Aortic disease.  Right flank pain. EXAM: CT ANGIOGRAPHY CHEST, ABDOMEN AND PELVIS TECHNIQUE: Multidetector CT imaging through the chest, abdomen and pelvis was performed using the standard protocol during bolus administration of intravenous contrast.  Multiplanar reconstructed images and MIPs were obtained and reviewed to evaluate the vascular anatomy. CONTRAST:  123mL ISOVUE-370 IOPAMIDOL (ISOVUE-370) INJECTION 76% COMPARISON:  None. FINDINGS: CTA CHEST FINDINGS Cardiovascular: Heart is normal size. Aorta is normal caliber. No evidence of dissection. No visible central pulmonary embolus. Mediastinum/Nodes: No mediastinal, hilar, or axillary adenopathy. Trachea and esophagus are unremarkable. Lungs/Pleura: Dependent atelectasis. No confluent opacities otherwise. No effusions. Musculoskeletal: Chest wall soft tissues are unremarkable. No acute bony abnormality. Review of the MIP images confirms the above findings. CTA ABDOMEN AND PELVIS FINDINGS VASCULAR Aorta: Normal size.  No aneurysm or dissection. Celiac: Widely patent SMA: Runny patent Renals: Single bilaterally, widely patent IMA: Widely patent Inflow: No aneurysm or dissection. Veins: Grossly unremarkable. Review of the MIP images confirms the above findings. NON-VASCULAR Hepatobiliary: No focal hepatic abnormality. Gallbladder unremarkable. Pancreas: No focal abnormality or ductal dilatation. Spleen: No focal abnormality.  Normal size. Adrenals/Urinary Tract: No adrenal abnormality. No focal renal abnormality. No stones or hydronephrosis. Urinary bladder is unremarkable. Stomach/Bowel: Normal appendix. Moderate stool burden in the colon. Stomach, large and small bowel grossly unremarkable. Lymphatic: No adenopathy. Reproductive: Is mildly prominent prostate. Other: No free fluid or free air. Musculoskeletal: No acute bony abnormality. Review of the MIP images confirms the above findings. IMPRESSION: No evidence of aortic aneurysm or dissection. Minimal dependent atelectasis in the lower lobes. No acute findings in the abdomen or pelvis. Moderate stool burden. Mildly prominent prostate. Electronically Signed   By: Rolm Baptise M.D.   On: 09/25/2017 11:44     ____________________________________________   PROCEDURES Procedures  ____________________________________________  DIFFERENTIAL DIAGNOSIS   aortic dissection, AAA, pancreatitis, gastritis, lumbar strain. Low suspicion ACS PE pneumonia and pneumothorax. Low suspicion bowel perforation or obstruction, biliary disease, appendicitis.  CLINICAL IMPRESSION / ASSESSMENT AND PLAN / ED COURSE  Pertinent labs & imaging results that were available during my care of the patient were reviewed by me and considered in my medical decision making (see chart for details).      Clinical Course as of Sep 26 1330  Tue Sep 25, 2017  1025 Radiating upper and lower back pain. Check CTA, labs. Dilaudid IV for pain. Ivf hydration.    [PS]  1240 CTA negative. Labs show mild pancreatitis. CT shows normal appearance of pancreas and biliary system. Will PO trial, plan to DC with pain control if symptoms are controlled.   [PS]  1331 C/w dehydration.    Specific Gravity, Urine(!): >1.046 [PS]    Clinical Course User Index [PS] Carrie Mew,  MD    ----------------------------------------- 1:02 PM on 09/25/2017 -----------------------------------------  Patient calm and comfortable, ambulatory, provided urine sample. Tolerating oral intake. Symptoms greatly improved. Presentation CONSISTENT with acute alcoholic pancreatitis, symptoms now controlled, suitable for outpatient follow-up. Counseled patient on avoiding liquor and heavy alcohol intake in the future.   ____________________________________________   FINAL CLINICAL IMPRESSION(S) / ED DIAGNOSES    Final diagnoses:  Alcohol-induced acute pancreatitis without infection or necrosis  Dehydration     ED Discharge Orders        Ordered    naproxen (NAPROSYN) 500 MG tablet  2 times daily with meals     09/25/17 1259    ondansetron (ZOFRAN ODT) 4 MG disintegrating tablet  Every 8 hours PRN     09/25/17 1259      Portions of this  note were generated with dragon dictation software. Dictation errors may occur despite best attempts at proofreading.    Carrie Mew, MD 09/25/17 1332

## 2017-09-25 NOTE — ED Notes (Signed)
First Nurse Note:  Patient complaining of side pain, placed in WC.  Alert and oriented.

## 2017-10-26 ENCOUNTER — Emergency Department
Admission: EM | Admit: 2017-10-26 | Discharge: 2017-10-26 | Disposition: A | Discharge status: Home / Self Care | Payer: Medicaid Other | Attending: Emergency Medicine | Admitting: Emergency Medicine

## 2017-10-26 ENCOUNTER — Other Ambulatory Visit: Payer: Self-pay

## 2017-10-26 ENCOUNTER — Encounter: Payer: Self-pay | Admitting: Emergency Medicine

## 2017-10-26 DIAGNOSIS — K0889 Other specified disorders of teeth and supporting structures: Secondary | ICD-10-CM | POA: Insufficient documentation

## 2017-10-26 DIAGNOSIS — E860 Dehydration: Secondary | ICD-10-CM

## 2017-10-26 DIAGNOSIS — I10 Essential (primary) hypertension: Secondary | ICD-10-CM | POA: Insufficient documentation

## 2017-10-26 DIAGNOSIS — R55 Syncope and collapse: Secondary | ICD-10-CM

## 2017-10-26 DIAGNOSIS — J449 Chronic obstructive pulmonary disease, unspecified: Secondary | ICD-10-CM | POA: Insufficient documentation

## 2017-10-26 DIAGNOSIS — K089 Disorder of teeth and supporting structures, unspecified: Secondary | ICD-10-CM

## 2017-10-26 DIAGNOSIS — F17210 Nicotine dependence, cigarettes, uncomplicated: Secondary | ICD-10-CM | POA: Insufficient documentation

## 2017-10-26 DIAGNOSIS — Z79899 Other long term (current) drug therapy: Secondary | ICD-10-CM | POA: Insufficient documentation

## 2017-10-26 DIAGNOSIS — G8929 Other chronic pain: Secondary | ICD-10-CM

## 2017-10-26 LAB — CBC WITH DIFFERENTIAL/PLATELET
Basophils Absolute: 0 10*3/uL (ref 0–0.1)
Basophils Relative: 0 %
EOS ABS: 0.1 10*3/uL (ref 0–0.7)
EOS PCT: 1 %
HCT: 45.6 % (ref 40.0–52.0)
Hemoglobin: 16 g/dL (ref 13.0–18.0)
LYMPHS PCT: 13 %
Lymphs Abs: 1.4 10*3/uL (ref 1.0–3.6)
MCH: 31 pg (ref 26.0–34.0)
MCHC: 35 g/dL (ref 32.0–36.0)
MCV: 88.6 fL (ref 80.0–100.0)
Monocytes Absolute: 1.1 10*3/uL — ABNORMAL HIGH (ref 0.2–1.0)
Monocytes Relative: 10 %
Neutro Abs: 7.6 10*3/uL — ABNORMAL HIGH (ref 1.4–6.5)
Neutrophils Relative %: 74 %
PLATELETS: 225 10*3/uL (ref 150–440)
RBC: 5.15 MIL/uL (ref 4.40–5.90)
RDW: 13.6 % (ref 11.5–14.5)
WBC: 10.2 10*3/uL (ref 3.8–10.6)

## 2017-10-26 LAB — COMPREHENSIVE METABOLIC PANEL
ALT: 16 U/L (ref 0–44)
AST: 25 U/L (ref 15–41)
Albumin: 4.4 g/dL (ref 3.5–5.0)
Alkaline Phosphatase: 61 U/L (ref 38–126)
Anion gap: 11 (ref 5–15)
BILIRUBIN TOTAL: 1 mg/dL (ref 0.3–1.2)
BUN: 31 mg/dL — AB (ref 6–20)
CO2: 23 mmol/L (ref 22–32)
Calcium: 9 mg/dL (ref 8.9–10.3)
Chloride: 103 mmol/L (ref 98–111)
Creatinine, Ser: 1.54 mg/dL — ABNORMAL HIGH (ref 0.61–1.24)
GFR calc non Af Amer: 52 mL/min — ABNORMAL LOW (ref >60)
GFR, EST AFRICAN AMERICAN: 60 mL/min — AB (ref >60)
Glucose, Bld: 80 mg/dL (ref 70–99)
POTASSIUM: 3.4 mmol/L — AB (ref 3.5–5.1)
Sodium: 137 mmol/L (ref 135–145)
TOTAL PROTEIN: 7.5 g/dL (ref 6.5–8.1)

## 2017-10-26 LAB — LIPASE, BLOOD: Lipase: 29 U/L (ref 11–51)

## 2017-10-26 LAB — ETHANOL

## 2017-10-26 LAB — CK: Total CK: 146 U/L (ref 49–397)

## 2017-10-26 LAB — TROPONIN I: Troponin I: <0.03 ng/mL (ref <0.03)

## 2017-10-26 MED ORDER — OXYCODONE-ACETAMINOPHEN 5-325 MG PO TABS: 1.0000 | ORAL_TABLET | ORAL | 0 refills | Status: DC | PRN

## 2017-10-26 MED ORDER — BUPIVACAINE HCL 0.5 % IJ SOLN
50.0000 mL | Freq: Once | INTRAMUSCULAR | Status: DC
  Filled 2017-10-26 (×3): qty 50

## 2017-10-26 MED ORDER — BUPIVACAINE HCL (PF) 0.5 % IJ SOLN
INTRAMUSCULAR | Status: AC
  Filled 2017-10-26: qty 30

## 2017-10-26 MED ORDER — SODIUM CHLORIDE 0.9 % IV BOLUS
1000.0000 mL | Freq: Once | INTRAVENOUS | Status: AC
  Administered 2017-10-26: 1000 mL via INTRAVENOUS

## 2017-10-26 MED ORDER — AMOXICILLIN 500 MG PO CAPS: 500.0000 mg | ORAL_CAPSULE | Freq: Three times a day (TID) | ORAL | 0 refills | Status: DC

## 2017-10-26 MED ORDER — SODIUM CHLORIDE 0.9 % IV BOLUS: 1000.0000 mL | Freq: Once | INTRAVENOUS | Status: DC

## 2017-10-26 NOTE — ED Notes (Signed)
Pt to receive rest of IVF and then to be d/c home. Pt updated with plan of care. VSS.

## 2017-10-26 NOTE — ED Triage Notes (Signed)
Pt to ED via EMS from work c/o possible syncopal episode, found on floor by coworkers, pt states got real hot but does not really remember what happened.  EMS states VSS, CBG 144, given 200cc normal saline bolus, pt with abscess to lower left tooth and not eating much.  Arrives A&Ox4, speaking in complete and coherent sentences.  Works in an Adult nurse.  EDP at bedside.

## 2017-10-26 NOTE — Discharge Instructions (Addendum)
OPTIONS FOR DENTAL FOLLOW UP CARE ° °[place] Department [facility] and Human Services - Local Safety Net Dental Clinics °http://www.ncdhhs.gov/dph/oralhealth/services/safetynetclinics.htm °  °[facility] (336-562-3123) ° °[facility] ([phone]) ° °[facility] ([phone] ext [phone]) ° °[facility] ([phone]) ° °[facility] ([phone]) °This clinic caters to the indigent population and is on a lottery system. °Location: °[facility], [address], [place] °Clinic Hours: °Wednesdays from 6pm - 9pm, patients seen by a lottery system. °For dates, call or go to [email or web address]/[facility]/patients/[email or web address] °Services: °Cleanings, fillings and simple extractions. °Payment Options: °DENTAL WORK IS FREE OF CHARGE. Bring proof of income or support. °Best way to get seen: °Arrive at 5:15 pm - this is a lottery, NOT first come/first serve, so arriving earlier will not increase your chances of being seen. °  °  °[facility] °[phone] °Select option 1 for emergencies °  °Location: °[facility], [address], [place] °Clinic Hours: °No walk-ins accepted - call the day before to schedule an appointment. °Check in times are 9:30 am and 1:30 pm. °Services: °Simple extractions, temporary fillings, pulpectomy/pulp debridement, uncomplicated abscess drainage. °Payment Options: °PAYMENT IS DUE AT THE TIME OF SERVICE.  Fee is usually $100-200, additional surgical procedures (e.g. abscess drainage) may be extra. °Cash, checks, Visa/MasterCard accepted.  Can file Medicaid if patient is covered for dental - patient should call case worker to check. °No discount for [facility] patients. °Best way to get seen: °MUST call the day before and get onto the schedule. Can usually be seen the next 1-2 days. No walk-ins accepted. °  °  °[facility] °[phone] °   °Location: °[facility], [address], [place] °Clinic Hours: °M, W, Th, F 8am or 1:30pm, Tues 9a or 1:30 - first come/first served. °Services: °Simple extractions, temporary fillings, uncomplicated abscess drainage.  You do not need to be an [place] resident. °Payment Options: °PAYMENT IS DUE AT THE TIME OF SERVICE. °Dental insurance, otherwise sliding scale - bring proof of income or support. °Depending on income and treatment needed, cost is usually $50-200. °Best way to get seen: °Arrive early as it is first come/first served. °  °  °[facility] °[phone] °  °Location: °[address] °Clinic Hours: °Mon-Thu 8a-5p °Services: °Most basic dental services including extractions and fillings. °Payment Options: °PAYMENT IS DUE AT THE TIME OF SERVICE. °Sliding scale, up to 50% off - bring proof if income or support. °Medicaid with dental option accepted. °Best way to get seen: °Call to schedule an appointment, can usually be seen within 2 weeks OR they will try to see walk-ins - show up at 8a or 2p (you may have to wait). °  °  °[facility] °[phone] °[place] RESIDENTS ONLY °  °Location: °[facility], [address], [place], [place] [address] °Clinic Hours: By appointment only. °Monday - Thursday 8am-5pm, Friday 8am-12pm °Services: Cleanings, fillings, extractions. °Payment Options: °PAYMENT IS DUE AT THE TIME OF SERVICE. °Cash, Visa or MasterCard. Sliding scale - $30 minimum per service. °Best way to get seen: °Come in to office, complete packet and make an appointment - need proof of income °or support monies for each household member and proof of [place] residence. °Usually takes about a month to get in. °  °  °[facility] °[phone] °  °Location: °[address]   [place] °Clinic Hours: Walk-in Urgent Care Dental Services are offered Monday-Friday  mornings only. °The numbers of emergencies accepted daily is limited to the number of °providers available. °Maximum 15 - Mondays, Wednesdays & Thursdays °Maximum 10 - Tuesdays & Fridays °Services: °You do not need to be a [place] resident to be seen for a dental emergency. °Emergencies are defined as pain, swelling, abnormal bleeding, or dental trauma. Walkins will receive x-rays if needed. °NOTE: Dental cleaning is not an emergency. °Payment Options: °PAYMENT IS DUE AT THE TIME OF SERVICE. °Minimum co-pay is $40.00 for uninsured patients. °Minimum co-pay is $3.00 for Medicaid with dental coverage. °Dental Insurance is accepted and must be presented at time of visit. °Medicare does not cover dental. °Forms of payment: Cash, credit card, checks. °Best way to get seen: °If not previously registered with the clinic, walk-in dental registration begins at 7:15 am and is on a first come/first serve basis. °If previously registered with the clinic, call to make an appointment. °  °  °[facility] °[phone] °[place] RESIDENTS ONLY °  °Location: °[address], [place], [place] °Clinic Hours: °Mon-Thu 10a-2p °Services: Extractions only! °Payment Options: °FREE (donations accepted) - bring proof of income or support °Best way to get seen: °Call and schedule an appointment OR come at 8am on the 1st Monday of every month (except for holidays) when it is first come/first served. °  °  °[facility] °[phone] °  °Location: °[address], [place] °Clinic Hours: °Friday mornings °Services, Payment Options, Best way to get seen: °Call for info °

## 2017-10-26 NOTE — ED Notes (Signed)
MD requested rectal temperature.  Pt refused.  MD notified.

## 2017-10-26 NOTE — ED Provider Notes (Addendum)
[facility] Emergency Department Provider Note  ____________________________________________   I have reviewed the triage vital signs and the nursing notes. Where available I have reviewed prior notes and, if possible and indicated, outside hospital notes.    HISTORY  Chief Complaint Loss of Consciousness and Dehydration    HPI Christopher Harrell is a 47 y.o. male  Resents today complaining of having passed out.  Patient works in a [employer], dispensing, he had little to eat or drink today because he has been having dental pain from a chronic cavity in the left lower most posterior molar.  He states the pains been acting up for couple days, he is been trying over-the-counter medications as directed with minimal relief.  He states he went to work in [employer], it was hot, he became very hot and he passed out.  He does not believe he hit his head.  He seems to remember falling.  He states that he had no tongue bite no loss of bowel or bladder, is afebrile for EMS, responsive and ANO x4 for them.  He complains of dental pain mostly.  He also is a "burning" chronic abdominal discomfort which she has been evaluated for him and told that he has pancreatitis.  He denies drinking alcohol or taking recreational drugs.  Denies melena or bright red blood per rectum.  Patient does have a documented history of EtOH abuse. He was here about 3 months ago for abdominal pain at which time he had a CT chest abdomen pelvis with dissection protocol which showed constipation and one slightly prominent prostate but no other acute pathology.  His lipase however was slightly elevated.  In any event, he is complaining about dental pain and feeling weak he does not feel that he had any injury from the fall.  Vital signs are stable for EMS.  History per patient and EMS.  Past Medical History:  Diagnosis Date  . Community acquired pneumonia    a. Dx 01/21/2016 - [facility] PNA.  . Essential hypertension   .  Tobacco abuse     Patient Active Problem List   Diagnosis Date Noted  . Hypertriglyceridemia 06/05/2017  . COPD with acute exacerbation ([place]) 10/14/2016  . Pneumonia 10/14/2016  . Hypertensive urgency, malignant 06/13/2016  . Tobacco abuse 06/13/2016  . Community acquired pneumonia 01/23/2016  . Elevated troponin 01/23/2016  . HTN (hypertension) 01/23/2016  . Chest pain 01/23/2016    Past Surgical History:  Procedure Laterality Date  . NO PAST SURGERIES      Prior to Admission medications   Medication Sig Start Date End Date Taking? Authorizing Provider  albuterol (PROVENTIL HFA;VENTOLIN HFA) 108 (90 Base) MCG/ACT inhaler Inhale 2 puffs into the lungs every 6 (six) hours as needed for wheezing or shortness of breath. 01/02/17   Zara Council A, PA-C  amLODipine (NORVASC) 10 MG tablet Take 1 tablet (10 mg total) by mouth daily. Patient not taking: Reported on 09/25/2017 06/05/17   Doles-Johnson, Teah, NP  lisinopril-hydrochlorothiazide (PRINZIDE,ZESTORETIC) 20-12.5 MG tablet Take 1 tablet by mouth daily. Patient not taking: Reported on 09/25/2017 06/05/17   Doles-Johnson, Teah, NP  naproxen (NAPROSYN) 500 MG tablet Take 1 tablet (500 mg total) by mouth 2 (two) times daily with a meal. 09/25/17   Carrie Mew, MD  nicotine (NICOTROL) 10 MG inhaler Inhale 1 Cartridge (1 continuous puffing total) into the lungs as needed for smoking cessation. Patient not taking: Reported on 09/25/2017 01/02/17   Zara Council A, PA-C  ondansetron [facility]  ODT) 4 MG disintegrating tablet Take 1 tablet (4 mg total) by mouth every 8 (eight) hours as needed for nausea or vomiting. 09/25/17   Carrie Mew, MD  predniSONE (STERAPRED UNI-PAK 21 TAB) 10 MG (21) TBPK tablet Start at 60mg  taper by 10mg  until complete Patient not taking: Reported on 09/25/2017 10/16/16   Dustin Flock, MD    Allergies Patient has no known allergies.  Family History  Problem Relation Age of Onset  . Hypertension Mother    . Hypertension Father   . Heart attack Maternal Grandmother     Social History Social History   Tobacco Use  . Smoking status: Current Every Day Smoker    Packs/day: 0.50    Types: Cigarettes  . Smokeless tobacco: Never Used  Substance Use Topics  . Alcohol use: No  . Drug use: No    Review of Systems Constitutional: No fever/chills Eyes: No visual changes. ENT: No sore throat. No stiff neck no neck pain Cardiovascular: Denies chest pain. Respiratory: Denies shortness of breath. Gastrointestinal:   no vomiting.  No diarrhea.  Mild chronic constipation relieved he states by taking home laxatives Genitourinary: Negative for dysuria. Musculoskeletal: Negative lower extremity swelling Skin: Negative for rash. Neurological: Negative for severe headaches, focal weakness or numbness.   ____________________________________________   PHYSICAL EXAM:  VITAL SIGNS: ED Triage Vitals  Enc Vitals Group     BP 10/26/17 1607 115/69     Pulse Rate 10/26/17 1607 90     Resp 10/26/17 1607 12     Temp 10/26/17 1607 97.6 F (36.4 C)     Temp Source 10/26/17 1607 Oral     SpO2 10/26/17 1607 97 %     Weight 10/26/17 1605 194 lb (88 kg)     Height 10/26/17 1605 5\' 9"  (1.753 m)     Head Circumference --      Peak Flow --      Pain Score 10/26/17 1605 9     Pain Loc --      Pain Edu? --      Excl. in [place]? --     Constitutional: Alert and oriented. Well appearing and in no acute distress. Eyes: Conjunctivae are normal Head: Atraumatic HEENT: No congestion/rhinnorhea. Mucous membranes are moist.  Oropharynx non-erythematous, there is been no tenderness palpation to the posterior molar on the left lower, however, there is no blood weeks angina there is no significant swelling or erythema.  Significant tooth decay noted in the mouth generally speaking. Neck:   Nontender with no meningismus, no masses, no stridor Cardiovascular: Normal rate, regular rhythm. Grossly normal heart sounds.   Good peripheral circulation. Respiratory: Normal respiratory effort.  No retractions. Lungs CTAB. Abdominal: Soft and very slight diffuse tenderness. No distention. No guarding no rebound Back:  There is no focal tenderness or step off.  there is no midline tenderness there are no lesions noted. there is no CVA tenderness Musculoskeletal: No lower extremity tenderness, no upper extremity tenderness. No joint effusions, no DVT signs strong distal pulses no edema Neurologic:  Normal speech and language. No gross focal neurologic deficits are appreciated.  Skin:  Skin is warm, dry and intact. No rash noted. Psychiatric: Mood and affect are somewhat anxious. Speech and behavior are normal.  ____________________________________________   LABS (all labs ordered are listed, but only abnormal results are displayed)  Labs Reviewed  CK  COMPREHENSIVE METABOLIC PANEL  CBC WITH DIFFERENTIAL/PLATELET  ETHANOL  LIPASE, BLOOD  URINALYSIS, COMPLETE (UACMP) WITH MICROSCOPIC  URINE DRUG SCREEN, QUALITATIVE ([facility] ONLY)  TROPONIN I    Pertinent labs  results that were available during my care of the patient were reviewed by me and considered in my medical decision making (see chart for details). ____________________________________________  EKG  I personally interpreted any EKGs ordered by me or triage Sinus rhythm rate 87 bpm no acute ST elevation no acute ST depression normal axis. ____________________________________________  RADIOLOGY  Pertinent labs & imaging results that were available during my care of the patient were reviewed by me and considered in my medical decision making (see chart for details). If possible, patient and/or family made aware of any abnormal findings.  No results found. ____________________________________________    PROCEDURES  Procedure(s) performed:    Dental block with bupivacaine, 0.5%, Verbal consent obtained Risks benefits and alternatives  discussed Patient agrees to procedure Timeout performed We did do approximately 2 cc of bupivacaine using a 25-gauge needle to the gumline below that last tooth on the left on the bottom..  Tooth #17 patient tolerated the procedure well no complications had reduced pain afterwards.  Observed him for allergic reaction which was not detected      Procedures  Critical Care performed: None  ____________________________________________   INITIAL IMPRESSION / ASSESSMENT AND PLAN / ED COURSE  Pertinent labs & imaging results that were available during my care of the patient were reviewed by me and considered in my medical decision making (see chart for details).  Patient here after getting hot and overwhelmed in a very hot day in a non-air conditioned warehouse after having limited p.o. intake, he has multiple complaints, he feels dehydrated because he has not been eating or drinking very much because he has a toothache, no evidence of abscess or Ludwig's angina.  He has some very slight abdominal discomfort which is nonfocal and he had a recent CT scan for this.  We will check a lipase, total CK, CBC BMP, electrolytes and kidney function, we will check urine drug screen and urinalysis and EtOH, will give him IV fluids here although he does not appear to be significantly dehydrated there certainly is room for improvement there, there is no evidence of trauma from the fall no close head injury no headache, he is alert and oriented, there is no bruising there is no evidence of neck injury etc.  I do not think CT scan is warranted at this time and he does not feel warm, his oral temperature is normal, we have offered rectal temperature to ensure that he does not have heat exhaustion and he declines.  Certainly could have normal thermia heat related injury however and I think that is certainly not unlikely.  I think you will feel better with IV fluids and give conditioning and we will check and make sure  no other acute pathology is present.  ----------------------------------------- 5:40 PM on 10/26/2017 -----------------------------------------  Patient noted to be in creatinine are consistent with dehydration which is consistent with his general exam and history.  He is eating and drinking here, his family brought him possibly which she has been tolerating with no difficulty.  We did do a dental block which makes him feel a lot better.  He needs to see a dentist we will refer him.  There is no evidence of abscess but I will start him on antibiotics as a precaution given increased pain in that tooth.  Advised him to stay very well-hydrated, he is awake and alert no acute distress no evidence of rhabdomyolysis  or trauma on tertiary exam.  We will discharge him promptly    ____________________________________________   FINAL CLINICAL IMPRESSION(S) / ED DIAGNOSES  Final diagnoses:  None      This chart was dictated using voice recognition software.  Despite best efforts to proofread,  errors can occur which can change meaning.      Schuyler Amor, MD 10/26/17 1613    Schuyler Amor, MD 10/26/17 1738    Schuyler Amor, MD 10/26/17 [phone]

## 2017-11-14 ENCOUNTER — Other Ambulatory Visit: Payer: Self-pay | Admitting: Adult Health Nurse Practitioner

## 2018-01-10 ENCOUNTER — Encounter: Payer: Self-pay | Admitting: Adult Health

## 2018-01-10 ENCOUNTER — Ambulatory Visit: Payer: Medicaid Other | Admitting: Adult Health

## 2018-01-10 VITALS — BP 133/92 | HR 69 | Temp 98.2°F | Ht 68.0 in | Wt 194.6 lb

## 2018-01-10 DIAGNOSIS — Z720 Tobacco use: Secondary | ICD-10-CM

## 2018-01-10 DIAGNOSIS — J441 Chronic obstructive pulmonary disease with (acute) exacerbation: Secondary | ICD-10-CM

## 2018-01-10 DIAGNOSIS — K0889 Other specified disorders of teeth and supporting structures: Secondary | ICD-10-CM

## 2018-01-10 DIAGNOSIS — I10 Essential (primary) hypertension: Secondary | ICD-10-CM

## 2018-01-10 MED ORDER — LOSARTAN POTASSIUM 50 MG PO TABS: 50.0000 mg | ORAL_TABLET | Freq: Every evening | ORAL | 2 refills | Status: DC

## 2018-01-10 MED ORDER — NAPROXEN 500 MG PO TABS: 500.0000 mg | ORAL_TABLET | Freq: Two times a day (BID) | ORAL | 1 refills | Status: DC | PRN

## 2018-01-10 MED ORDER — HYDROCHLOROTHIAZIDE 25 MG PO TABS: 25.0000 mg | ORAL_TABLET | Freq: Every day | ORAL | 2 refills | Status: DC

## 2018-01-10 MED ORDER — ALBUTEROL SULFATE HFA 108 (90 BASE) MCG/ACT IN AERS: 2.0000 | INHALATION_SPRAY | Freq: Four times a day (QID) | RESPIRATORY_TRACT | 3 refills | Status: DC | PRN

## 2018-01-10 NOTE — Patient Instructions (Signed)
Hydrochlorothiazide, HCTZ capsules or tablets What is this medicine? HYDROCHLOROTHIAZIDE (hye droe klor oh THYE a zide) is a diuretic. It increases the amount of urine passed, which causes the body to lose salt and water. This medicine is used to treat high blood pressure. It is also reduces the swelling and water retention caused by various medical conditions, such as heart, liver, or kidney disease. This medicine may be used for other purposes; ask your health care provider or pharmacist if you have questions. COMMON BRAND NAME(S): Esidrix, Ezide, HydroDIURIL, Microzide, Oretic, Zide What should I tell my health care provider before I take this medicine? They need to know if you have any of these conditions: -diabetes -gout -immune system problems, like lupus -kidney disease or kidney stones -liver disease -pancreatitis -small amount of urine or difficulty passing urine -an unusual or allergic reaction to hydrochlorothiazide, sulfa drugs, other medicines, foods, dyes, or preservatives -pregnant or trying to get pregnant -breast-feeding How should I use this medicine? Take this medicine by mouth with a glass of water. Follow the directions on the prescription label. Take your medicine at regular intervals. Remember that you will need to pass urine frequently after taking this medicine. Do not take your doses at a time of day that will cause you problems. Do not stop taking your medicine unless your doctor tells you to. Talk to your pediatrician regarding the use of this medicine in children. Special care may be needed. Overdosage: If you think you have taken too much of this medicine contact a poison control center or emergency room at once. NOTE: This medicine is only for you. Do not share this medicine with others. What if I miss a dose? If you miss a dose, take it as soon as you can. If it is almost time for your next dose, take only that dose. Do not take double or extra doses. What may  interact with this medicine? -cholestyramine -colestipol -digoxin -dofetilide -lithium -medicines for blood pressure -medicines for diabetes -medicines that relax muscles for surgery -other diuretics -steroid medicines like prednisone or cortisone This list may not describe all possible interactions. Give your health care provider a list of all the medicines, herbs, non-prescription drugs, or dietary supplements you use. Also tell them if you smoke, drink alcohol, or use illegal drugs. Some items may interact with your medicine. What should I watch for while using this medicine? Visit your doctor or health care professional for regular checks on your progress. Check your blood pressure as directed. Ask your doctor or health care professional what your blood pressure should be and when you should contact him or her. You may need to be on a special diet while taking this medicine. Ask your doctor. Check with your doctor or health care professional if you get an attack of severe diarrhea, nausea and vomiting, or if you sweat a lot. The loss of too much body fluid can make it dangerous for you to take this medicine. You may get drowsy or dizzy. Do not drive, use machinery, or do anything that needs mental alertness until you know how this medicine affects you. Do not stand or sit up quickly, especially if you are an older patient. This reduces the risk of dizzy or fainting spells. Alcohol may interfere with the effect of this medicine. Avoid alcoholic drinks. This medicine may affect your blood sugar level. If you have diabetes, check with your doctor or health care professional before changing the dose of your diabetic medicine. This medicine  can make you more sensitive to the sun. Keep out of the sun. If you cannot avoid being in the sun, wear protective clothing and use sunscreen. Do not use sun lamps or tanning beds/booths. What side effects may I notice from receiving this medicine? Side effects  that you should report to your doctor or health care professional as soon as possible: -allergic reactions such as skin rash or itching, hives, swelling of the lips, mouth, tongue, or throat -changes in vision -chest pain -eye pain -fast or irregular heartbeat -feeling faint or lightheaded, falls -gout attack -muscle pain or cramps -pain or difficulty when passing urine -pain, tingling, numbness in the hands or feet -redness, blistering, peeling or loosening of the skin, including inside the mouth -unusually weak or tired Side effects that usually do not require medical attention (report to your doctor or health care professional if they continue or are bothersome): -change in sex drive or performance -dry mouth -headache -stomach upset This list may not describe all possible side effects. Call your doctor for medical advice about side effects. You may report side effects to FDA at 1-800-FDA-1088. Where should I keep my medicine? Keep out of the reach of children. Store at room temperature between 15 and 30 degrees C (59 and 86 degrees F). Do not freeze. Protect from light and moisture. Keep container closed tightly. Throw away any unused medicine after the expiration date. NOTE: This sheet is a summary. It may not cover all possible information. If you have questions about this medicine, talk to your doctor, pharmacist, or health care provider.  2018 Elsevier/Gold Standard (2009-12-10 12:57:37) Losartan tablets What is this medicine? LOSARTAN (loe SAR tan) is used to treat high blood pressure and to reduce the risk of stroke in certain patients. This drug also slows the progression of kidney disease in patients with diabetes. This medicine may be used for other purposes; ask your health care provider or pharmacist if you have questions. COMMON BRAND NAME(S): Cozaar What should I tell my health care provider before I take this medicine? They need to know if you have any of these  conditions: -heart failure -kidney or liver disease -an unusual or allergic reaction to losartan, other medicines, foods, dyes, or preservatives -pregnant or trying to get pregnant -breast-feeding How should I use this medicine? Take this medicine by mouth with a glass of water. Follow the directions on the prescription label. This medicine can be taken with or without food. Take your doses at regular intervals. Do not take your medicine more often than directed. Talk to your pediatrician regarding the use of this medicine in children. Special care may be needed. Overdosage: If you think you have taken too much of this medicine contact a poison control center or emergency room at once. NOTE: This medicine is only for you. Do not share this medicine with others. What if I miss a dose? If you miss a dose, take it as soon as you can. If it is almost time for your next dose, take only that dose. Do not take double or extra doses. What may interact with this medicine? -blood pressure medicines -diuretics, especially triamterene, spironolactone, or amiloride -fluconazole -NSAIDs, medicines for pain and inflammation, like ibuprofen or naproxen -potassium salts or potassium supplements -rifampin This list may not describe all possible interactions. Give your health care provider a list of all the medicines, herbs, non-prescription drugs, or dietary supplements you use. Also tell them if you smoke, drink alcohol, or use illegal drugs.  Some items may interact with your medicine. What should I watch for while using this medicine? Visit your doctor or health care professional for regular checks on your progress. Check your blood pressure as directed. Ask your doctor or health care professional what your blood pressure should be and when you should contact him or her. Call your doctor or health care professional if you notice an irregular or fast heart beat. Women should inform their doctor if they wish to  become pregnant or think they might be pregnant. There is a potential for serious side effects to an unborn child, particularly in the second or third trimester. Talk to your health care professional or pharmacist for more information. You may get drowsy or dizzy. Do not drive, use machinery, or do anything that needs mental alertness until you know how this drug affects you. Do not stand or sit up quickly, especially if you are an older patient. This reduces the risk of dizzy or fainting spells. Alcohol can make you more drowsy and dizzy. Avoid alcoholic drinks. Avoid salt substitutes unless you are told otherwise by your doctor or health care professional. Do not treat yourself for coughs, colds, or pain while you are taking this medicine without asking your doctor or health care professional for advice. Some ingredients may increase your blood pressure. What side effects may I notice from receiving this medicine? Side effects that you should report to your doctor or health care professional as soon as possible: -confusion, dizziness, light headedness or fainting spells -decreased amount of urine passed -difficulty breathing or swallowing, hoarseness, or tightening of the throat -fast or irregular heart beat, palpitations, or chest pain -skin rash, itching -swelling of your face, lips, tongue, hands, or feet Side effects that usually do not require medical attention (report to your doctor or health care professional if they continue or are bothersome): -cough -decreased sexual function or desire -headache -nasal congestion or stuffiness -nausea or stomach pain -sore or cramping muscles This list may not describe all possible side effects. Call your doctor for medical advice about side effects. You may report side effects to FDA at 1-800-FDA-1088. Where should I keep my medicine? Keep out of the reach of children. Store at room temperature between 15 and 30 degrees C (59 and 86 degrees F).  Protect from light. Keep container tightly closed. Throw away any unused medicine after the expiration date. NOTE: This sheet is a summary. It may not cover all possible information. If you have questions about this medicine, talk to your doctor, pharmacist, or health care provider.  2018 Elsevier/Gold Standard (2007-06-28 16:42:18)

## 2018-01-10 NOTE — Progress Notes (Signed)
Patient: Christopher Harrell Male    DOB: 1971-04-09   47 y.o.   MRN: [id number] Visit Date: 01/10/2018  Today's Provider: Deforest Hoyles, NP   Chief Complaint  Patient presents with  . Back Pain    pain extends from back to legs bilaterally   Subjective:    HPI 47 y/o male with hypertension current tobacco use who presents for follow-up of hypertension, and a plaints of  back pain that radiates down both legs.  States that he has been out of his medications for weeks now.  He reports not being able to afford some of his medications.  States that he went to the medication management clinic but was unable to get his medications.  After discussion with patient and his significant other, it was clear that patient did not complete the paperwork that was required.  His blood pressure is borderline elevated today.  He is on lisinopril/HCTZ and states that when he takes it he does not sleep because he urinates all night.  He denies headache dizziness nausea and vomiting.  He continues to smoke and does not expressed a desire to quit.  States that his back pain is chronic without any interval worsening.  He is on naproxen and states that he does get some relief with it.  Plan he needs a refill of all his medications. He was recently treated for a dental abscess.  He reports moderate pain around the left jaw.  He denies any fever and chills. No Known Allergies Previous Medications   ALBUTEROL (PROVENTIL HFA;VENTOLIN HFA) 108 (90 BASE) MCG/ACT INHALER    Inhale 2 puffs into the lungs every 6 (six) hours as needed for wheezing or shortness of breath.   AMLODIPINE (NORVASC) 10 MG TABLET    TAKE 1 TABLET BY MOUTH ONCE DAILY   AMOXICILLIN (AMOXIL) 500 MG CAPSULE    Take 1 capsule (500 mg total) by mouth 3 (three) times daily.   LISINOPRIL-HYDROCHLOROTHIAZIDE (PRINZIDE,ZESTORETIC) 20-12.5 MG TABLET    TAKE 1 TABLET BY MOUTH ONCE DAILY   NAPROXEN (NAPROSYN) 500 MG TABLET    Take 1 tablet (500 mg total) by  mouth 2 (two) times daily with a meal.   NICOTINE (NICOTROL) 10 MG INHALER    Inhale 1 Cartridge (1 continuous puffing total) into the lungs as needed for smoking cessation.   ONDANSETRON (ZOFRAN ODT) 4 MG DISINTEGRATING TABLET    Take 1 tablet (4 mg total) by mouth every 8 (eight) hours as needed for nausea or vomiting.   OXYCODONE-ACETAMINOPHEN (PERCOCET) 5-325 MG TABLET    Take 1 tablet by mouth every 4 (four) hours as needed for severe pain.   PREDNISONE (STERAPRED UNI-PAK 21 TAB) 10 MG (21) TBPK TABLET    Start at 60mg  taper by 10mg  until complete    Review of Systems  Constitutional: Negative.   Respiratory: Negative.   Cardiovascular: Negative.   Musculoskeletal: Positive for back pain.  Skin: Negative.   Neurological: Negative.     Social History   Tobacco Use  . Smoking status: Current Every Day Smoker    Packs/day: 0.50    Types: Cigarettes  . Smokeless tobacco: Never Used  Substance Use Topics  . Alcohol use: No   Objective:   BP (!) 133/92 (BP Location: Left Arm, Patient Position: Sitting)   Pulse 69   Temp 98.2 F (36.8 C) (Oral)   Ht 5\' 8"  (1.727 m)   Wt 194 lb 9.6 oz (88.3 kg)   BMI 29.59 kg/m  Physical Exam  Constitutional: He is oriented to person, place, and time. He appears well-developed and well-nourished.  HENT:  Poor dentition with multiple caries  Cardiovascular: Normal rate, regular rhythm and normal heart sounds.  Pulmonary/Chest: Effort normal and breath sounds normal.  Abdominal: Soft. Bowel sounds are normal.  Musculoskeletal: Normal range of motion.  Neurological: He is alert and oriented to person, place, and time.  Skin: Skin is warm and dry.  Psychiatric: He has a normal mood and affect.  Nursing note and vitals reviewed.  Assessment & Plan:  1. Essential hypertension Poor control due to nonadherence.  Will discontinue lisinopril and HCTZ.  Start losartan 50 mg in the evening and hydrochlorothiazide 25 mg in the morning.  Patient  encouraged to monitor and log blood pressure readings and bring all readings during next visit.  2. Tobacco abuse Smoking cessation advice given - Ambulatory referral to Smoking Cessation Program  3. Toothache Continue naproxen as needed and warm salt water gargles - Ambulatory referral to [place], NP   [facility]

## 2018-02-07 ENCOUNTER — Ambulatory Visit: Payer: Medicaid Other | Admitting: Gerontology

## 2018-02-07 ENCOUNTER — Encounter: Payer: Self-pay | Admitting: Gerontology

## 2018-02-07 ENCOUNTER — Other Ambulatory Visit: Payer: Self-pay

## 2018-02-07 ENCOUNTER — Telehealth: Payer: Self-pay

## 2018-02-07 ENCOUNTER — Ambulatory Visit: Payer: Medicaid Other | Admitting: Adult Health

## 2018-02-07 VITALS — BP 143/100 | HR 79 | Resp 14 | Ht 68.0 in | Wt 190.6 lb

## 2018-02-07 DIAGNOSIS — Z720 Tobacco use: Secondary | ICD-10-CM

## 2018-02-07 DIAGNOSIS — K0889 Other specified disorders of teeth and supporting structures: Secondary | ICD-10-CM

## 2018-02-07 DIAGNOSIS — I10 Essential (primary) hypertension: Secondary | ICD-10-CM

## 2018-02-07 MED ORDER — LOSARTAN POTASSIUM 100 MG PO TABS: 100.0000 mg | ORAL_TABLET | Freq: Every day | ORAL | 0 refills | Status: DC

## 2018-02-07 MED ORDER — HYDROCHLOROTHIAZIDE 25 MG PO TABS: 12.5000 mg | ORAL_TABLET | Freq: Every day | ORAL | 2 refills | Status: DC

## 2018-02-07 MED ORDER — LOSARTAN POTASSIUM 100 MG PO TABS
100.0000 mg | ORAL_TABLET | Freq: Every day | ORAL | 0 refills | Status: DC
Start: 2018-02-07 — End: 2018-02-27

## 2018-02-07 NOTE — Progress Notes (Signed)
  Patient: Christopher Harrell Male    DOB: 1970/12/08   47 y.o.   MRN: [id number] Visit Date: 02/07/2018  Today's Provider: Langston Reusing, NP   Chief Complaint  Patient presents with  . Hypertension   Subjective:    HPI Mr Boley is a 47 y/o male presents for follow up on HTN, takes '25mg'$  HCTZ and 50 mg Losartan at night, c/o waking up more than 8 times at night, and experiencing decreased libido since last visit. He states feeling sluggish. He states that he started taking multivitamin daily.C/o achy 6/10 pain to bilateral ankle while walking and the pain has being going on since 2 yrs and Tylenol relieves pain. He continues to smoke and expressed desire to quit. He denies any tooth ache and request dental referral.    No Known Allergies Previous Medications   ALBUTEROL (PROVENTIL HFA;VENTOLIN HFA) 108 (90 BASE) MCG/ACT INHALER    Inhale 2 puffs into the lungs every 6 (six) hours as needed for wheezing or shortness of breath.   HYDROCHLOROTHIAZIDE (HYDRODIURIL) 25 MG TABLET    Take 1 tablet (25 mg total) by mouth daily.   LOSARTAN (COZAAR) 50 MG TABLET    Take 1 tablet (50 mg total) by mouth every evening.   NAPROXEN (NAPROSYN) 500 MG TABLET    Take 1 tablet (500 mg total) by mouth 2 (two) times daily as needed. Take with meals    Review of Systems  Constitutional: Negative for activity change, appetite change and unexpected weight change.  HENT: Positive for dental problem.   Respiratory: Negative.   Cardiovascular: Negative.   Endocrine: Positive for polyuria.  Genitourinary: Negative for flank pain.  Psychiatric/Behavioral: Negative.     Social History   Tobacco Use  . Smoking status: Current Every Day Smoker    Packs/day: 0.50    Types: Cigarettes  . Smokeless tobacco: Never Used  Substance Use Topics  . Alcohol use: No   Objective:   BP (!) 143/100 (BP Location: Right Arm, Patient Position: Sitting)   Pulse 79   Resp 14   Ht '5\' 8"'$  (1.727 m)   Wt 190 lb 9.6 oz  (86.5 kg)   SpO2 95%   BMI 28.98 kg/m   Physical Exam  Constitutional: He is oriented to person, place, and time. He appears well-developed and well-nourished.  HENT:  Head: Normocephalic and atraumatic.  Mouth/Throat: Dental caries present.  Cardiovascular: Normal rate, regular rhythm, normal heart sounds and intact distal pulses. Exam reveals no gallop and no friction rub.  No murmur heard. Pulmonary/Chest: Effort normal and breath sounds normal.  Musculoskeletal: He exhibits tenderness.  Neurological: He is alert and oriented to person, place, and time.  Skin: Skin is warm and dry.  Psychiatric: He has a normal mood and affect. His behavior is normal. Judgment and thought content normal.  to bilateral ankles 6/10 pain with touch      Assessment & Plan:     1. Essential hypertension BP not well contolled, Increased Losartan to '100mg'$  daily at night, decreased HCTZ to 12.'5mg'$  in the morning. Dose decreased due to patient c/o decreased libido - Comp Met (CMET) prior to appointment.  2. Tobacco abuse Patient advised to call [facility] [place], brochure provided.  3. Toothache Referral to Dentist, continues Naproxen as needed and warm salt gargles.       Langston Reusing, NP   [facility] [place]

## 2018-02-07 NOTE — Telephone Encounter (Signed)
Question:  Need updated on Dental referral for patient.  Referral was sent on 01/10/18.  Response from Vena Rua at [facility]:  Patient has been called twice, no answer each time, have left messages and have not received a call back.  Thank you!

## 2018-02-13 ENCOUNTER — Other Ambulatory Visit: Payer: Medicaid Other

## 2018-02-27 ENCOUNTER — Encounter: Payer: Self-pay | Admitting: Gerontology

## 2018-02-27 ENCOUNTER — Ambulatory Visit: Payer: Medicaid Other | Admitting: Gerontology

## 2018-02-27 ENCOUNTER — Other Ambulatory Visit: Payer: Self-pay

## 2018-02-27 VITALS — BP 152/106 | HR 73 | Ht 68.0 in | Wt 200.1 lb

## 2018-02-27 DIAGNOSIS — I10 Essential (primary) hypertension: Secondary | ICD-10-CM

## 2018-02-27 DIAGNOSIS — Z720 Tobacco use: Secondary | ICD-10-CM

## 2018-02-27 DIAGNOSIS — R0789 Other chest pain: Secondary | ICD-10-CM

## 2018-02-27 MED ORDER — LISINOPRIL 20 MG PO TABS: 20.0000 mg | ORAL_TABLET | Freq: Every day | ORAL | 0 refills | Status: DC

## 2018-02-27 NOTE — Progress Notes (Signed)
Patient: Christopher Harrell Male    DOB: [birth year]   47 y.o.   MRN: [id number] Visit Date: 02/27/2018  Today's Provider: Langston Reusing, NP   Chief Complaint  Patient presents with  . Follow-up    high blood pressure   Subjective:    HPI Christopher Harrell presents for follow up on Hypertension. C/o sharp 6/10 right sided chest pain that has being going on for 1 week. States that the chest pain lasted all day yesterday, was dizzy and didn't go to work.Christopher Harrell took 81 mg Aspirin and 1000 mg of Tylenol which relieved the pain.  Denies any palpitation, shortness of breath and chest pain did not radiate.   Christopher Harrell stopped taking 12.38m HCTZ and 100 mg Losartan 1 week ago,stated that it was causing his chest pain. Christopher Harrell continues to smoke 1/2 pack a day and admits no desire to quit. Christopher Harrell c/o voiding a lot and states their is no improvement in his libido. Has not being using Albuterol, states it's very expensive  No Known Allergies Previous Medications   ALBUTEROL (PROVENTIL HFA;VENTOLIN HFA) 108 (90 BASE) MCG/ACT INHALER    Inhale 2 puffs into the lungs every 6 (six) hours as needed for wheezing or shortness of breath.   HYDROCHLOROTHIAZIDE (HYDRODIURIL) 25 MG TABLET    Take 0.5 tablets (12.5 mg total) by mouth daily.   LOSARTAN (COZAAR) 100 MG TABLET    Take 1 tablet (100 mg total) by mouth daily.   NAPROXEN (NAPROSYN) 500 MG TABLET    Take 1 tablet (500 mg total) by mouth 2 (two) times daily as needed. Take with meals    Review of Systems  Constitutional: Negative.   HENT: Negative.   Eyes: Negative.   Respiratory: Positive for chest tightness (has not being using albuterol).   Cardiovascular: Positive for chest pain (sharp right sided chest pain).  Gastrointestinal: Negative.   Genitourinary: Negative.   Musculoskeletal: Negative.   Skin: Negative.   Neurological: Negative.   Psychiatric/Behavioral: Negative.     Social History   Tobacco Use  . Smoking status: Current Every Day Smoker   Packs/day: 0.50    Types: Cigarettes  . Smokeless tobacco: Never Used  Substance Use Topics  . Alcohol use: No   Objective:   BP (!) 152/106 (BP Location: Left Arm, Patient Position: Sitting)   Pulse 73   Ht 5' 8"  (1.727 m)   Wt 200 lb 1.6 oz (90.8 kg)   SpO2 98%   BMI 30.43 kg/m   Physical Exam  Constitutional: Christopher Harrell is oriented to person, place, and time. Christopher Harrell appears well-developed and well-nourished.  HENT:  Head: Normocephalic and atraumatic.  Eyes: Pupils are equal, round, and reactive to light. EOM are normal.  Neck: Normal range of motion. Neck supple.  Cardiovascular: Normal rate, regular rhythm, normal heart sounds, intact distal pulses and normal pulses.  Tenderness on palpitation to right chest  Pulmonary/Chest: Effort normal and breath sounds normal.  Abdominal: Soft. Bowel sounds are normal.  Musculoskeletal: Normal range of motion.       Right shoulder: Christopher Harrell exhibits no tenderness.  Neurological: Christopher Harrell is alert and oriented to person, place, and time.  Skin: Skin is warm and dry.  Psychiatric: Christopher Harrell has a normal mood and affect.        Assessment & Plan:         1. Essential hypertension Non compliant with BP medication, BP was 152/106, Goal is < 140/90 - lisinopril (PRINIVIL,ZESTRIL) 20 MG tablet; Take 1 tablet (20  mg total) by mouth daily.  Dispense: 30 tablet; Refill: 0 - Comp Met (CMET) - HgB A1c - Lipid Profile Monitor BP and keep log, bring to next appointment Low salt diet encouraged Follow up in 2 weeks 2. Other chest pain  - Troponin I - EKG 12-Lead Encouraged to obtain and use Albuterol inhaler as needed. 3. Tobacco abuse Advised on smoking cessation    Tarri Guilfoil Jerold Coombe, NP   [facility] [place]

## 2018-02-27 NOTE — Patient Instructions (Signed)
DASH Eating Plan DASH stands for "Dietary Approaches to Stop Hypertension." The DASH eating plan is a healthy eating plan that has been shown to reduce high blood pressure (hypertension). It may also reduce your risk for type 2 diabetes, heart disease, and stroke. The DASH eating plan may also help with weight loss. What are tips for following this plan? General guidelines  Avoid eating more than 2,300 mg (milligrams) of salt (sodium) a day. If you have hypertension, you may need to reduce your sodium intake to 1,500 mg a day.  Limit alcohol intake to no more than 1 drink a day for nonpregnant women and 2 drinks a day for men. One drink equals 12 oz of beer, 5 oz of wine, or 1 oz of hard liquor.  Work with your health care provider to maintain a healthy body weight or to lose weight. Ask what an ideal weight is for you.  Get at least 30 minutes of exercise that causes your heart to beat faster (aerobic exercise) most days of the week. Activities may include walking, swimming, or biking.  Work with your health care provider or diet and nutrition specialist (dietitian) to adjust your eating plan to your individual calorie needs. Reading food labels  Check food labels for the amount of sodium per serving. Choose foods with less than 5 percent of the Daily Value of sodium. Generally, foods with less than 300 mg of sodium per serving fit into this eating plan.  To find whole grains, look for the word "whole" as the first word in the ingredient list. Shopping  Buy products labeled as "low-sodium" or "no salt added."  Buy fresh foods. Avoid canned foods and premade or frozen meals. Cooking  Avoid adding salt when cooking. Use salt-free seasonings or herbs instead of table salt or sea salt. Check with your health care provider or pharmacist before using salt substitutes.  Do not fry foods. Cook foods using healthy methods such as baking, boiling, grilling, and broiling instead.  Cook with  heart-healthy oils, such as olive, canola, soybean, or sunflower oil. Meal planning   Eat a balanced diet that includes: ? 5 or more servings of fruits and vegetables each day. At each meal, try to fill half of your plate with fruits and vegetables. ? Up to 6-8 servings of whole grains each day. ? Less than 6 oz of lean meat, poultry, or fish each day. A 3-oz serving of meat is about the same size as a deck of cards. One egg equals 1 oz. ? 2 servings of low-fat dairy each day. ? A serving of nuts, seeds, or beans 5 times each week. ? Heart-healthy fats. Healthy fats called Omega-3 fatty acids are found in foods such as flaxseeds and coldwater fish, like sardines, salmon, and mackerel.  Limit how much you eat of the following: ? Canned or prepackaged foods. ? Food that is high in trans fat, such as fried foods. ? Food that is high in saturated fat, such as fatty meat. ? Sweets, desserts, sugary drinks, and other foods with added sugar. ? Full-fat dairy products.  Do not salt foods before eating.  Try to eat at least 2 vegetarian meals each week.  Eat more home-cooked food and less restaurant, buffet, and fast food.  When eating at a restaurant, ask that your food be prepared with less salt or no salt, if possible. What foods are recommended? The items listed may not be a complete list. Talk with your dietitian about what   dietary choices are best for you. Grains Whole-grain or whole-wheat bread. Whole-grain or whole-wheat pasta. Brown rice. Oatmeal. Quinoa. Bulgur. Whole-grain and low-sodium cereals. Pita bread. Low-fat, low-sodium crackers. Whole-wheat flour tortillas. Vegetables Fresh or frozen vegetables (raw, steamed, roasted, or grilled). Low-sodium or reduced-sodium tomato and vegetable juice. Low-sodium or reduced-sodium tomato sauce and tomato paste. Low-sodium or reduced-sodium canned vegetables. Fruits All fresh, dried, or frozen fruit. Canned fruit in natural juice (without  added sugar). Meat and other protein foods Skinless chicken or turkey. Ground chicken or turkey. Pork with fat trimmed off. Fish and seafood. Egg whites. Dried beans, peas, or lentils. Unsalted nuts, nut butters, and seeds. Unsalted canned beans. Lean cuts of beef with fat trimmed off. Low-sodium, lean deli meat. Dairy Low-fat (1%) or fat-free (skim) milk. Fat-free, low-fat, or reduced-fat cheeses. Nonfat, low-sodium ricotta or cottage cheese. Low-fat or nonfat yogurt. Low-fat, low-sodium cheese. Fats and oils Soft margarine without trans fats. Vegetable oil. Low-fat, reduced-fat, or light mayonnaise and salad dressings (reduced-sodium). Canola, safflower, olive, soybean, and sunflower oils. Avocado. Seasoning and other foods Herbs. Spices. Seasoning mixes without salt. Unsalted popcorn and pretzels. Fat-free sweets. What foods are not recommended? The items listed may not be a complete list. Talk with your dietitian about what dietary choices are best for you. Grains Baked goods made with fat, such as croissants, muffins, or some breads. Dry pasta or rice meal packs. Vegetables Creamed or fried vegetables. Vegetables in a cheese sauce. Regular canned vegetables (not low-sodium or reduced-sodium). Regular canned tomato sauce and paste (not low-sodium or reduced-sodium). Regular tomato and vegetable juice (not low-sodium or reduced-sodium). Pickles. Olives. Fruits Canned fruit in a light or heavy syrup. Fried fruit. Fruit in cream or butter sauce. Meat and other protein foods Fatty cuts of meat. Ribs. Fried meat. Bacon. Sausage. Bologna and other processed lunch meats. Salami. Fatback. Hotdogs. Bratwurst. Salted nuts and seeds. Canned beans with added salt. Canned or smoked fish. Whole eggs or egg yolks. Chicken or turkey with skin. Dairy Whole or 2% milk, cream, and half-and-half. Whole or full-fat cream cheese. Whole-fat or sweetened yogurt. Full-fat cheese. Nondairy creamers. Whipped toppings.  Processed cheese and cheese spreads. Fats and oils Butter. Stick margarine. Lard. Shortening. Ghee. Bacon fat. Tropical oils, such as coconut, palm kernel, or palm oil. Seasoning and other foods Salted popcorn and pretzels. Onion salt, garlic salt, seasoned salt, table salt, and sea salt. Worcestershire sauce. Tartar sauce. Barbecue sauce. Teriyaki sauce. Soy sauce, including reduced-sodium. Steak sauce. Canned and packaged gravies. Fish sauce. Oyster sauce. Cocktail sauce. Horseradish that you find on the shelf. Ketchup. Mustard. Meat flavorings and tenderizers. Bouillon cubes. Hot sauce and Tabasco sauce. Premade or packaged marinades. Premade or packaged taco seasonings. Relishes. Regular salad dressings. Where to find more information:  National Heart, Lung, and Blood Institute: www.nhlbi.nih.gov  American Heart Association: www.heart.org Summary  The DASH eating plan is a healthy eating plan that has been shown to reduce high blood pressure (hypertension). It may also reduce your risk for type 2 diabetes, heart disease, and stroke.  With the DASH eating plan, you should limit salt (sodium) intake to 2,300 mg a day. If you have hypertension, you may need to reduce your sodium intake to 1,500 mg a day.  When on the DASH eating plan, aim to eat more fresh fruits and vegetables, whole grains, lean proteins, low-fat dairy, and heart-healthy fats.  Work with your health care provider or diet and nutrition specialist (dietitian) to adjust your eating plan to your individual   calorie needs. This information is not intended to replace advice given to you by your health care provider. Make sure you discuss any questions you have with your health care provider. Document Released: 04/06/2011 Document Revised: 04/10/2016 Document Reviewed: 04/10/2016 Elsevier Interactive Patient Education  2018 Elsevier Inc.  

## 2018-02-28 ENCOUNTER — Ambulatory Visit: Payer: Medicaid Other | Admitting: Adult Health

## 2018-02-28 ENCOUNTER — Ambulatory Visit: Payer: Medicaid Other | Admitting: Gerontology

## 2018-02-28 LAB — COMPREHENSIVE METABOLIC PANEL
A/G RATIO: 1.9 (ref 1.2–2.2)
ALK PHOS: 60 IU/L (ref 39–117)
ALT: 19 IU/L (ref 0–44)
AST: 16 IU/L (ref 0–40)
Albumin: 4.4 g/dL (ref 3.5–5.5)
BUN / CREAT RATIO: 19 (ref 9–20)
BUN: 17 mg/dL (ref 6–24)
Bilirubin Total: 0.6 mg/dL (ref 0.0–1.2)
CO2: 24 mmol/L (ref 20–29)
Calcium: 9.7 mg/dL (ref 8.7–10.2)
Chloride: 103 mmol/L (ref 96–106)
Creatinine, Ser: 0.88 mg/dL (ref 0.76–1.27)
GFR calc non Af Amer: 102 mL/min/{1.73_m2} (ref >59)
GFR, EST AFRICAN AMERICAN: 117 mL/min/{1.73_m2} (ref >59)
GLUCOSE: 102 mg/dL — AB (ref 65–99)
Globulin, Total: 2.3 g/dL (ref 1.5–4.5)
POTASSIUM: 5.5 mmol/L — AB (ref 3.5–5.2)
Sodium: 142 mmol/L (ref 134–144)
TOTAL PROTEIN: 6.7 g/dL (ref 6.0–8.5)

## 2018-02-28 LAB — LIPID PANEL
CHOLESTEROL TOTAL: 235 mg/dL — AB (ref 100–199)
Chol/HDL Ratio: 5.1 ratio — ABNORMAL HIGH (ref 0.0–5.0)
HDL: 46 mg/dL (ref >39)
LDL Calculated: 157 mg/dL — ABNORMAL HIGH (ref 0–99)
Triglycerides: 162 mg/dL — ABNORMAL HIGH (ref 0–149)
VLDL Cholesterol Cal: 32 mg/dL (ref 5–40)

## 2018-02-28 LAB — URINALYSIS
Bilirubin, UA: NEGATIVE
Glucose, UA: NEGATIVE
Ketones, UA: NEGATIVE
LEUKOCYTES UA: NEGATIVE
NITRITE UA: NEGATIVE
Protein, UA: NEGATIVE
RBC, UA: NEGATIVE
Specific Gravity, UA: 1.021 (ref 1.005–1.030)
Urobilinogen, Ur: 0.2 mg/dL (ref 0.2–1.0)
pH, UA: 5 (ref 5.0–7.5)

## 2018-02-28 LAB — MICROALBUMIN / CREATININE URINE RATIO
Creatinine, Urine: 138.2 mg/dL
Microalb/Creat Ratio: 3.8 mg/g creat (ref 0.0–30.0)
Microalbumin, Urine: 5.2 ug/mL

## 2018-02-28 LAB — HEMOGLOBIN A1C
ESTIMATED AVERAGE GLUCOSE: 108 mg/dL
Hgb A1c MFr Bld: 5.4 % (ref 4.8–5.6)

## 2018-02-28 LAB — TROPONIN I: Troponin I: 0.02 ng/mL (ref 0.00–0.04)

## 2018-03-13 ENCOUNTER — Ambulatory Visit: Payer: Medicaid Other | Admitting: Gerontology

## 2018-04-11 ENCOUNTER — Telehealth: Payer: Self-pay | Admitting: Adult Health Nurse Practitioner

## 2018-04-11 NOTE — Telephone Encounter (Signed)
Called about Dental Clinic referral. Needs to bring in $20.00 co-pay for Dental Clinic.

## 2018-05-30 ENCOUNTER — Ambulatory Visit: Payer: Medicaid Other | Admitting: Urology

## 2018-05-30 VITALS — BP 200/133 | HR 74 | Temp 98.6°F | Ht 69.0 in | Wt 213.2 lb

## 2018-05-30 DIAGNOSIS — I10 Essential (primary) hypertension: Secondary | ICD-10-CM

## 2018-05-30 DIAGNOSIS — G43001 Migraine without aura, not intractable, with status migrainosus: Secondary | ICD-10-CM

## 2018-05-30 MED ORDER — LISINOPRIL 20 MG PO TABS: 20.0000 mg | ORAL_TABLET | Freq: Every day | ORAL | 0 refills | Status: DC

## 2018-05-30 MED ORDER — HYDROCHLOROTHIAZIDE 25 MG PO TABS: 25.0000 mg | ORAL_TABLET | Freq: Every day | ORAL | 0 refills | Status: DC

## 2018-05-30 MED ORDER — AMLODIPINE BESYLATE 5 MG PO TABS: 5.0000 mg | ORAL_TABLET | Freq: Every day | ORAL | 0 refills | Status: DC

## 2018-05-30 NOTE — Progress Notes (Signed)
  Patient: Christopher Harrell Male    DOB: 05/04/1970   48 y.o.   MRN: [id number] Visit Date: 05/30/2018  Today's Provider: [place]   Chief Complaint  Patient presents with  . Medication Refill    Hasn't had meds for 1 week  . Migraine   Subjective:    HPI Patient has been having a migraine four to five times a week.    He has been out of his BP for one week.    BP is 200/133.  He drinks two 20 ounce bottles of Mountain Dew a Day     No Known Allergies Previous Medications   ALBUTEROL (PROVENTIL HFA;VENTOLIN HFA) 108 (90 BASE) MCG/ACT INHALER    Inhale 2 puffs into the lungs every 6 (six) hours as needed for wheezing or shortness of breath.   NAPROXEN (NAPROSYN) 500 MG TABLET    Take 1 tablet (500 mg total) by mouth 2 (two) times daily as needed. Take with meals    Review of Systems  Social History   Tobacco Use  . Smoking status: Current Every Day Smoker    Packs/day: 0.50    Types: Cigarettes  . Smokeless tobacco: Never Used  Substance Use Topics  . Alcohol use: No   Objective:   BP (!) 200/133   Pulse 74   Temp 98.6 F (37 C)   Ht 5\' 9"  (1.753 m)   Wt 213 lb 3.2 oz (96.7 kg)   BMI 31.48 kg/m   Physical Exam Constitutional:  Well nourished. Alert and oriented, No acute distress. HEENT: [place] AT, moist mucus membranes.  Trachea midline, no masses. Cardiovascular: No clubbing, cyanosis, or edema. Respiratory: Normal respiratory effort, no increased work of breathing. Skin: No rashes, bruises or suspicious lesions. Neurologic: Grossly intact, no focal deficits, moving all 4 extremities. Psychiatric: Normal mood and affect.      Assessment & Plan:     1. Uncontrolled HTN  - advised to go to the ED- reviewed red flags  - patient's lisinopril is refilled, he is started on amlodipine and HCTZ   - discussed the risk of stroke with elevated BP   - RTC in two weeks   2. Migraines  - advised to go to the ED      [facility] [place]

## 2018-05-30 NOTE — Patient Instructions (Signed)

## 2018-05-31 ENCOUNTER — Other Ambulatory Visit: Payer: Self-pay

## 2018-05-31 ENCOUNTER — Emergency Department: Payer: Self-pay

## 2018-05-31 ENCOUNTER — Emergency Department
Admission: EM | Admit: 2018-05-31 | Discharge: 2018-06-01 | Disposition: A | Discharge status: Home / Self Care | Payer: Self-pay | Attending: Emergency Medicine | Admitting: Emergency Medicine

## 2018-05-31 DIAGNOSIS — G43809 Other migraine, not intractable, without status migrainosus: Secondary | ICD-10-CM

## 2018-05-31 DIAGNOSIS — R0602 Shortness of breath: Secondary | ICD-10-CM | POA: Insufficient documentation

## 2018-05-31 DIAGNOSIS — F17210 Nicotine dependence, cigarettes, uncomplicated: Secondary | ICD-10-CM | POA: Insufficient documentation

## 2018-05-31 DIAGNOSIS — I10 Essential (primary) hypertension: Secondary | ICD-10-CM | POA: Insufficient documentation

## 2018-05-31 DIAGNOSIS — Z79899 Other long term (current) drug therapy: Secondary | ICD-10-CM | POA: Insufficient documentation

## 2018-05-31 LAB — COMPREHENSIVE METABOLIC PANEL
ALBUMIN: 4.8 g/dL (ref 3.5–5.0)
ALT: 27 U/L (ref 0–44)
ANION GAP: 9 (ref 5–15)
AST: 28 U/L (ref 15–41)
Alkaline Phosphatase: 60 U/L (ref 38–126)
BUN: 13 mg/dL (ref 6–20)
CO2: 25 mmol/L (ref 22–32)
Calcium: 9.2 mg/dL (ref 8.9–10.3)
Chloride: 103 mmol/L (ref 98–111)
Creatinine, Ser: 0.8 mg/dL (ref 0.61–1.24)
GFR calc Af Amer: >60 mL/min (ref >60)
GFR calc non Af Amer: >60 mL/min (ref >60)
GLUCOSE: 101 mg/dL — AB (ref 70–99)
Potassium: 3.5 mmol/L (ref 3.5–5.1)
Sodium: 137 mmol/L (ref 135–145)
Total Bilirubin: 1.2 mg/dL (ref 0.3–1.2)
Total Protein: 7.7 g/dL (ref 6.5–8.1)

## 2018-05-31 LAB — CBC
HCT: 46.2 % (ref 39.0–52.0)
Hemoglobin: 16.6 g/dL (ref 13.0–17.0)
MCH: 30.6 pg (ref 26.0–34.0)
MCHC: 35.9 g/dL (ref 30.0–36.0)
MCV: 85.2 fL (ref 80.0–100.0)
Platelets: 238 10*3/uL (ref 150–400)
RBC: 5.42 MIL/uL (ref 4.22–5.81)
RDW: 13.4 % (ref 11.5–15.5)
WBC: 7.3 10*3/uL (ref 4.0–10.5)
nRBC: 0 % (ref 0.0–0.2)

## 2018-05-31 LAB — BRAIN NATRIURETIC PEPTIDE: B Natriuretic Peptide: 17 pg/mL (ref 0.0–100.0)

## 2018-05-31 LAB — TROPONIN I: Troponin I: 0.03 ng/mL (ref <0.03)

## 2018-05-31 MED ORDER — PROCHLORPERAZINE EDISYLATE 10 MG/2ML IJ SOLN
5.0000 mg | Freq: Once | INTRAMUSCULAR | Status: AC
  Administered 2018-05-31: 5 mg via INTRAVENOUS
  Filled 2018-05-31: qty 2

## 2018-05-31 MED ORDER — HYDRALAZINE HCL 20 MG/ML IJ SOLN
10.0000 mg | Freq: Once | INTRAMUSCULAR | Status: AC
  Administered 2018-05-31: 10 mg via INTRAVENOUS
  Filled 2018-05-31: qty 1

## 2018-05-31 MED ORDER — DIPHENHYDRAMINE HCL 50 MG/ML IJ SOLN
25.0000 mg | Freq: Once | INTRAMUSCULAR | Status: AC
  Administered 2018-05-31: 25 mg via INTRAVENOUS
  Filled 2018-05-31: qty 1

## 2018-05-31 NOTE — ED Notes (Signed)
Lab called with a critical trop of 0.03. Dr. Beather Arbour made aware

## 2018-05-31 NOTE — ED Triage Notes (Signed)
Patient states "I just don't feel good".  Patient reports having migraine for the past week.  Also reports being short of breath and dizzy.

## 2018-05-31 NOTE — ED Provider Notes (Signed)
[facility] Emergency Department Provider Note   ____________________________________________   First MD Initiated Contact with Patient 05/31/18 2320     (approximate)  I have reviewed the triage vital signs and the nursing notes.   HISTORY  Chief Complaint Dizziness and Shortness of Breath    HPI Christopher Harrell is a 48 y.o. male with a history of hypertension who presents to the ED from home with a chief complaint of migraine headache.  Patient reports left-sided gradual onset migraine headache for the last 2 weeks.  Ran out of his lisinopril and [facility] his lisinopril as well as added  amlodipine and HCTZ.  He has not had a chance to fill his amlodipine nor HCTZ yet.  Blood pressure at the clinic yesterday was 200/133.  States his baseline diastolic blood pressure is around 100.  Endorses associated occasional blurry vision, nausea and shortness of breath.  Denies neck pain, chest pain, abdominal pain, vomiting, dysuria, diarrhea.  Denies recent travel or trauma.  Denies anticoagulant use.   Past Medical History:  Diagnosis Date  . Community acquired pneumonia    a. Dx 01/21/2016 - LLL PNA.  . Essential hypertension   . Tobacco abuse     Patient Active Problem List   Diagnosis Date Noted  . Hypertriglyceridemia 06/05/2017  . COPD with acute exacerbation ([place]) 10/14/2016  . Pneumonia 10/14/2016  . Hypertensive urgency, malignant 06/13/2016  . Tobacco abuse 06/13/2016  . Community acquired pneumonia 01/23/2016  . Elevated troponin 01/23/2016  . HTN (hypertension) 01/23/2016  . Chest pain 01/23/2016    Past Surgical History:  Procedure Laterality Date  . NO PAST SURGERIES      Prior to Admission medications   Medication Sig Start Date End Date Taking? Authorizing Provider  albuterol (PROVENTIL HFA;VENTOLIN HFA) 108 (90 Base) MCG/ACT inhaler Inhale 2 puffs into the lungs every 6 (six) hours  as needed for wheezing or shortness of breath. 01/10/18  Yes Tukov-Yual, Magdalene S, NP  amLODipine (NORVASC) 5 MG tablet Take 1 tablet (5 mg total) by mouth daily. 05/30/18  Yes McGowan, Larene Beach A, PA-C  hydrochlorothiazide (HYDRODIURIL) 25 MG tablet Take 1 tablet (25 mg total) by mouth daily. 05/30/18  Yes McGowan, Larene Beach A, PA-C  lisinopril (PRINIVIL,ZESTRIL) 20 MG tablet Take 1 tablet (20 mg total) by mouth daily. 05/30/18  Yes McGowan, Larene Beach A, PA-C  naproxen (NAPROSYN) 500 MG tablet Take 1 tablet (500 mg total) by mouth 2 (two) times daily as needed. Take with meals 01/10/18  Yes Tukov-Yual, Magdalene S, NP  prochlorperazine (COMPAZINE) 10 MG tablet Take 1 tablet (10 mg total) by mouth every 6 (six) hours as needed for nausea (headache). 06/01/18   Paulette Blanch, MD    Allergies Patient has no known allergies.  Family History  Problem Relation Age of Onset  . Hypertension Mother   . Hypertension Father   . Heart attack Maternal Grandmother     Social History Social History   Tobacco Use  . Smoking status: Current Every Day Smoker    Packs/day: 0.50    Types: Cigarettes  . Smokeless tobacco: Never Used  Substance Use Topics  . Alcohol use: No  . Drug use: No    Review of Systems  Constitutional: No fever/chills Eyes: No visual changes. ENT: No sore throat. Cardiovascular: Denies chest pain. Respiratory: Positive for shortness of breath. Gastrointestinal: No abdominal pain.  Positive for nausea, no vomiting.  No diarrhea.  No constipation. Genitourinary: Negative for dysuria. Musculoskeletal: Negative for back pain. Skin: Negative for rash. Neurological: Positive for headaches. Negative for focal weakness or numbness.   ____________________________________________   PHYSICAL EXAM:  VITAL SIGNS: ED Triage Vitals  Enc Vitals Group     BP 05/31/18 2220 (!) 224/129     Pulse Rate 05/31/18 2220 77     Resp 05/31/18 2220 (!) 23     Temp 05/31/18 2220 98.1 F (36.7  C)     Temp Source 05/31/18 2220 Oral     SpO2 05/31/18 2220 100 %     Weight 05/31/18 2221 213 lb (96.6 kg)     Height --      Head Circumference --      Peak Flow --      Pain Score 05/31/18 2221 10     Pain Loc --      Pain Edu? --      Excl. in [place]? --     Constitutional: Alert and oriented. Well appearing and in no acute distress. Eyes: Conjunctivae are normal. PERRL. EOMI. Head: Atraumatic. Nose: No congestion/rhinnorhea. Mouth/Throat: Mucous membranes are moist.  Oropharynx non-erythematous. Neck: No stridor.  No carotid bruits.  Supple neck without meningismus. Cardiovascular: Normal rate, regular rhythm. Grossly normal heart sounds.  Good peripheral circulation. Respiratory: Normal respiratory effort.  No retractions. Lungs CTAB. Gastrointestinal: Soft and nontender. No distention. No abdominal bruits. No CVA tenderness. Musculoskeletal: No lower extremity tenderness nor edema.  No joint effusions. Neurologic:  Alert and oriented x 3. CN II-XII grossly intact. Normal speech and language. No gross focal neurologic deficits are appreciated. No gait instability. Skin:  Skin is warm, dry and intact. No rash noted. No petechiae. Psychiatric: Mood and affect are normal. Speech and behavior are normal.  ____________________________________________   LABS (all labs ordered are listed, but only abnormal results are displayed)  Labs Reviewed  COMPREHENSIVE METABOLIC PANEL - Abnormal; Notable for the following components:      Result Value   Glucose, Bld 101 (*)    All other components within normal limits  TROPONIN I - Abnormal; Notable for the following components:   Troponin I 0.03 (*)    All other components within normal limits  CBC  BRAIN NATRIURETIC PEPTIDE  TROPONIN I  URINALYSIS, COMPLETE (UACMP) WITH MICROSCOPIC  URINE DRUG SCREEN, QUALITATIVE ([facility] ONLY)  CBG MONITORING, ED   ____________________________________________  EKG  ED ECG REPORT I, Shawnie Nicole J,  the attending physician, personally viewed and interpreted this ECG.   Date: 05/31/2018  EKG Time: 2231  Rate: 68  Rhythm: normal EKG, normal sinus rhythm  Axis: Normal  Intervals:none  ST&T Change: Nonspecific  ____________________________________________  RADIOLOGY  ED MD interpretation:  Mild pulmonary vascular congestion  Official radiology report(s): Dg Chest 2 View  Result Date: 05/31/2018 CLINICAL DATA:  Shortness of breath EXAM: CHEST - 2 VIEW COMPARISON:  09/25/2017 FINDINGS: Mild cardiomegaly and vascular congestion. No confluent airspace opacities, effusions or overt edema. No acute bony abnormality. IMPRESSION: Mild cardiomegaly, vascular congestion. Electronically Signed   By: Rolm Baptise M.D.   On: 05/31/2018 23:09   Ct Head Wo Contrast  Result Date: 05/31/2018 CLINICAL DATA:  Shortness of breath and dizziness. Migraine headache for the past week. EXAM: CT HEAD WITHOUT CONTRAST TECHNIQUE: Contiguous axial images were obtained from the base of the skull through the vertex without intravenous contrast. COMPARISON:  06/13/2016 FINDINGS: Brain: No evidence of acute infarction, hemorrhage, hydrocephalus, extra-axial collection or mass lesion/mass effect. Vascular:  No hyperdense vessel or unexpected calcification. Skull: Calvarium appears intact. Sinuses/Orbits: Paranasal sinuses and mastoid air cells are clear. Other: No change since prior study. IMPRESSION: No acute intracranial abnormalities. Electronically Signed   By: Lucienne Capers M.D.   On: 05/31/2018 23:51    ____________________________________________   PROCEDURES  Procedure(s) performed: None  Procedures  Critical Care performed: No  ____________________________________________   INITIAL IMPRESSION / ASSESSMENT AND PLAN / ED COURSE  As part of my medical decision making, I reviewed the following data within the [place] notes reviewed and incorporated, Labs reviewed, EKG  interpreted, Old chart reviewed, Radiograph reviewed and Notes from prior ED visits    48 year old male with essential hypertension who presents with a 2 week history of headache.  Frontal diagnosis includes but is not limited to [place], TIA, CVA, headache secondary to elevated blood pressure, CAD, etc.  Neck is supple and patient is without focal neurological deficits.  Initial troponin 0.03.  Will repeat timed troponin, obtain CT head to evaluate for intracranial hemorrhage.  Will initiate IV treatment with hydralazine, Compazine and Benadryl and reassess.  Clinical Course as of Jun 01 310  Sat Jun 01, 2018  0101 BP 164/109.  Patient sleeping soundly.  We will continue to monitor.   [JS]  0301 Patient feeling much better.  Updated him on all test results.  Will discharge home with prescription for Compazine.  Encouraged him to pick up his new blood pressure medications and to start them as instructed by his doctor.  Strict return precautions given.  Patient verbalizes understanding and agrees with plan of care.   [JS]  [id number] Patient hypertensive on recheck although he was moving his arm.  States he is feeling much better and eager for discharge home.  Will go ahead and give him his amlodipine and HCTZ which he has not been able to get filled.  No focal neurological deficits; neck remains supple without meningismus.  No headache.   [JS]    Clinical Course User Index [JS] Paulette Blanch, MD     ____________________________________________   FINAL CLINICAL IMPRESSION(S) / ED DIAGNOSES  Final diagnoses:  Essential hypertension  Other migraine without status migrainosus, not intractable     ED Discharge Orders         Ordered    prochlorperazine (COMPAZINE) 10 MG tablet  Every 6 hours PRN     06/01/18 0302           Note:  This document was prepared using Dragon voice recognition software and may include unintentional dictation errors.    Paulette Blanch, MD 06/01/18 [phone]

## 2018-06-01 LAB — TROPONIN I: Troponin I: <0.03 ng/mL (ref <0.03)

## 2018-06-01 MED ORDER — HYDROCHLOROTHIAZIDE 25 MG PO TABS
25.0000 mg | ORAL_TABLET | Freq: Once | ORAL | Status: AC
  Administered 2018-06-01: 25 mg via ORAL
  Filled 2018-06-01: qty 1

## 2018-06-01 MED ORDER — PROCHLORPERAZINE MALEATE 10 MG PO TABS: 10.0000 mg | ORAL_TABLET | Freq: Four times a day (QID) | ORAL | 0 refills | Status: DC | PRN

## 2018-06-01 MED ORDER — CLONIDINE HCL 0.1 MG PO TABS: 0.2000 mg | ORAL_TABLET | Freq: Once | ORAL | Status: DC

## 2018-06-01 MED ORDER — AMLODIPINE BESYLATE 5 MG PO TABS
5.0000 mg | ORAL_TABLET | Freq: Once | ORAL | Status: AC
  Administered 2018-06-01: 5 mg via ORAL
  Filled 2018-06-01: qty 1

## 2018-06-01 NOTE — Discharge Instructions (Signed)
1.  Please pick up your new blood pressure medicines and start them as directed by your doctor. 2.  You may take Compazine as needed for headache/nausea. 3.  Return to the ER for worsening symptoms, persistent vomiting, difficulty breathing, lethargy or other concerns.

## 2018-06-06 ENCOUNTER — Encounter: Payer: Self-pay | Admitting: Neurology

## 2018-06-13 ENCOUNTER — Ambulatory Visit: Payer: Medicaid Other | Admitting: Adult Health Nurse Practitioner

## 2018-06-13 ENCOUNTER — Ambulatory Visit: Payer: Medicaid Other | Admitting: Licensed Clinical Social Worker

## 2018-06-13 DIAGNOSIS — F330 Major depressive disorder, recurrent, mild: Secondary | ICD-10-CM

## 2018-06-13 DIAGNOSIS — I10 Essential (primary) hypertension: Secondary | ICD-10-CM

## 2018-06-13 MED ORDER — LISINOPRIL 40 MG PO TABS: 40.0000 mg | ORAL_TABLET | Freq: Every day | ORAL | 3 refills | Status: DC

## 2018-06-13 MED ORDER — AMLODIPINE BESYLATE 10 MG PO TABS: 10.0000 mg | ORAL_TABLET | Freq: Every day | ORAL | 3 refills | Status: DC

## 2018-06-13 NOTE — BH Specialist Note (Signed)
Integrated Behavioral Health Comprehensive Clinical Assessment  MRN: [id number] Name: Christopher Harrell   Type of Service: Integrated Behavioral Health-Individual Interpretor: No. Interpretor Name and Language: Not applicable.  PRESENTING CONCERNS: Christopher Harrell is a 48 y.o. male accompanied by himself. Christopher Harrell was referred to Christopher Harrell clinician for mental health.  Previous mental health services Have you ever been treated for a mental health problem? Yes If "Yes", when were you treated and whom did you see? Christopher Harrell previously saw Christopher Harrell, social work Psychologist, occupational for a couple of months. Have you ever been hospitalized for mental health treatment? No Have you ever been treated for any of the following? Past Psychiatric History/Hospitalization(s): Anxiety: Negative Bipolar Disorder: Negative Depression: Yes Christopher Harrell has been experiencing symptoms of depression for the last ten to fifteen years. He describes feeling down and depressed more than half the day, grief from the loss of his mother and ex wife leaving him, fatigue, overeating, and feeling bad about himself. He denies suicidal and homicidal thoughts. There is no access to a fire arm in the home.  Mania: Negative Psychosis: Negative Schizophrenia: Negative Personality Disorder: Negative Hospitalization for psychiatric illness: Negative History of Electroconvulsive Shock Therapy: Negative Prior Suicide Attempts: Negative Have you ever had thoughts of harming yourself or others or attempted suicide? No plan to harm self or others  Medical history  has a past medical history of Community acquired pneumonia, Essential hypertension, and Tobacco abuse. Primary Care Physician: Patient, No Pcp Per Date of last physical exam: 06/13/2018 Allergies: No Known Allergies Current medications:  Outpatient Encounter Medications as of 06/13/2018  Medication Sig  . albuterol (PROVENTIL HFA;VENTOLIN HFA) 108 (90 Base)  MCG/ACT inhaler Inhale 2 puffs into the lungs every 6 (six) hours as needed for wheezing or shortness of breath.  Marland Kitchen amLODipine (NORVASC) 5 MG tablet Take 1 tablet (5 mg total) by mouth daily.  . hydrochlorothiazide (HYDRODIURIL) 25 MG tablet Take 1 tablet (25 mg total) by mouth daily.  Marland Kitchen lisinopril (PRINIVIL,ZESTRIL) 20 MG tablet Take 1 tablet (20 mg total) by mouth daily.  . naproxen (NAPROSYN) 500 MG tablet Take 1 tablet (500 mg total) by mouth 2 (two) times daily as needed. Take with meals  . prochlorperazine (COMPAZINE) 10 MG tablet Take 1 tablet (10 mg total) by mouth every 6 (six) hours as needed for nausea (headache).   No facility-administered encounter medications on file as of 06/13/2018.    Have you ever had any serious medication reactions? No Is there any history of mental health problems or substance abuse in your family? No Has anyone in your family been hospitalized for mental health treatment? No  Social/family history Who lives in your current household? Christopher Harrell lives with his girlfriend.  What is your family of origin, childhood history? Christopher Harrell was born in [place].  Where were you born? [place]. Where did you grow up?Christopher Harrell grew up in [place]. How many different homes have you lived in? Christopher Harrell lived in [place] for 12 years prior to moving to [place] in the last three years.  Describe your childhood: Christopher Harrell describes his childhood as being good. He reports that he had a close relationship with his mother until she passed away on 05-09-23 from stage 4 cancer. He notes that every Sunday growing up that they had dinner with the family.  Do you have siblings, step/half siblings? Yes- Christopher Harrell is the oldest of four siblings.  What are their names, relation, sex, age? Christopher Harrell  has a 56 year old brother that lives in [place], a 16 year old brother than lives with him, and a 61 year old sister who lives in [place].  Are  your parents separated or divorced? No What are your social supports? Christopher Harrell has the support of his church family, his girlfriend, and his brother. He has four children: 43 year old daughter, 59 year old daughter, 71 year old daughter, and a 77 year old son who lives in [place] with his mom.  Christopher Harrell was married for four years. He explains that he is legally separated. He notes that his wife prior to leaving him without word was arrested for shop lifting and was incarcerated in [facility]. He explains that he was sending her money and getting her an attorney but when he went to see her was told that she was not there anymore. He explains that he is not sure why his wife left and he still thinks about it. He knows that she is in [place] and that he should contact her for a divorce. He explains that his girlfriend of a year is wanting to get married but that means having to reach out to his ex wife.  Education How many grades have you completed? 10th grade Did you have any problems in school? No  Employment/financial issues Christopher Harrell was previously working at [employer] until his mom's cancer returned and quit his job. He now works at [employer] in [place] part time.   Sleep Usual bedtime varies. Sleeping arrangements: with his girlfriend. Problems with snoring: No Obstructive sleep apnea is not a concern. Problems with nightmares: No Problems with night terrors: No Problems with sleepwalking: No  Trauma/Abuse history Have you ever experienced or been exposed to any form of abuse? No Have you ever experienced or been exposed to something traumatic? No  Substance use Do you use alcohol, nicotine or caffeine? no alcohol use How old were you when you first tasted alcohol? Christopher Harrell denies using alcohol or other drugs.  Have you ever used illicit drugs or abused prescription medications? Christopher Harrell. Christopher Harrell has not previously been in substance abuse  treatment. He denies ever using or abusing illicit drugs and prescription medicaitons.  Mental status General appearance/Behavior: Casual Eye contact: Fair Motor behavior: Normal Speech: Normal Level of consciousness: Alert Mood: Euthymic Affect: Appropriate Anxiety level: None Thought process: Coherent Thought content: WNL Perception: Normal Judgment: Good Insight: Present  Diagnosis No diagnosis found.  GOALS ADDRESSED: Patient will reduce symptoms of: anxiety, depression and stress and increase knowledge and/or ability of: coping skills, healthy habits, self-management skills and stress reduction and also: Increase healthy adjustment to current life circumstances              INTERVENTIONS: Interventions utilized: Biopsychosocial assessment Standardized Assessments completed: PHQ 9   ASSESSMENT/OUTCOME:  Bradyn Soward is a 48 year old African American male who presents today for a mental health assessment and was self referred. Christopher Harrell. Mcmanaman reports that he has been experiencing depression for the last ten tot 15 years. He explains that his depression is due to grief from his ex wife leaving, stress, and loosing his mom to stage 4 cancer. He has not previously been prescribed psychotropic medications. He has not previously been prescribed mental health medications in the past. He has not been hospitalized for mental illness or substance abuse. He previously saw a Development worker, community for therapy for a couple of months. He denies ever abusing drugs  or alcohol and being in substance abuse treatment.  Christopher Harrell. Gelb has a history of hypertension, COPD, and Hypertriglyceridemia. He is an established patient at [facility]. He has no history of allergies or adverse drug reactions. He needs an eye exam and needs his blood pressure checked. He is a smoker and was advised back in October of 2019 by nurse practitioner Carlyon Shadow, NP to quit smoking.   Christopher Harrell. Luna lives with his 49  year old brother, and girlfriend of one year. His girlfriend is an alcoholic but otherwise is a good woman according to Christopher Harrell. [place]. He was married for four years until his wife left without word and hasn't spoken to her in a year in three months. He would like to get a divorce but doesn't want to have to deal with his wife. He works part time at [employer] in [place], [place]. He attends [employer]. He has the support of his church family, girlfriend, and brother. He denies a history of mental illness or substance abuse in the family.   PLAN: Recommendation that Christopher Harrell. Gappa follow up at least once every three weeks for supportive therapy with Julian Hy, LCSW for his depression, stress, and grief.   Scheduled next visit: three weeks or earlier if needed.   [facility]

## 2018-06-13 NOTE — Progress Notes (Signed)
  Patient: Christopher Harrell Male    DOB: 06/26/1970   48 y.o.   MRN: [id number] Visit Date: 06/13/2018  Today's Provider: [place]   Chief Complaint  Patient presents with  . Follow-up    High BP, taking med   Subjective:    HPI   Seen in the ED on 1.31.2020 for migraine headaches x 2 weeks  and HTN. Given Compazine.  Last visit Amlodipine and HCTZ added to his Lisinopril.   Pt states that he has a migraine now 3-x weekly. States that he ate a pork tenderloin and it runs his BP up and gives him a migraine.   States that Excedrin helps his headache.   No Known Allergies Previous Medications   ALBUTEROL (PROVENTIL HFA;VENTOLIN HFA) 108 (90 BASE) MCG/ACT INHALER    Inhale 2 puffs into the lungs every 6 (six) hours as needed for wheezing or shortness of breath.   AMLODIPINE (NORVASC) 5 MG TABLET    Take 1 tablet (5 mg total) by mouth daily.   HYDROCHLOROTHIAZIDE (HYDRODIURIL) 25 MG TABLET    Take 1 tablet (25 mg total) by mouth daily.   LISINOPRIL (PRINIVIL,ZESTRIL) 20 MG TABLET    Take 1 tablet (20 mg total) by mouth daily.   NAPROXEN (NAPROSYN) 500 MG TABLET    Take 1 tablet (500 mg total) by mouth 2 (two) times daily as needed. Take with meals   PROCHLORPERAZINE (COMPAZINE) 10 MG TABLET    Take 1 tablet (10 mg total) by mouth every 6 (six) hours as needed for nausea (headache).    Review of Systems  All other systems reviewed and are negative.   Social History   Tobacco Use  . Smoking status: Current Every Day Smoker    Packs/day: 0.50    Types: Cigarettes  . Smokeless tobacco: Never Used  Substance Use Topics  . Alcohol use: No   Objective:   BP (!) 204/122 (BP Location: Left Arm, Patient Position: Sitting, Cuff Size: Normal)   Pulse 80   Temp 98 F (36.7 C) (Oral)   Ht 5\' 9"  (1.753 m)   Wt 214 lb 14.4 oz (97.5 kg)   BMI 31.74 kg/m   Physical Exam Vitals signs reviewed.  Constitutional:      Appearance: Normal appearance.  Cardiovascular:   Rate and Rhythm: Normal rate and regular rhythm.  Pulmonary:     Effort: Pulmonary effort is normal.     Breath sounds: Normal breath sounds.  Abdominal:     General: Bowel sounds are normal.  Neurological:     Mental Status: He is alert.         Assessment & Plan:         HTN:  Not controlled.  Goal BP <140/90.  Continue current medication regimen- increase Norvasc to 10mg  and Lisinopril to 40mg .  Encourage low salt diet and exercise.  Routine labs today.  ED precautions given- discussed CVA symptoms.   FU in 3 weeks.    [facility]

## 2018-06-14 LAB — LIPID PANEL
Chol/HDL Ratio: 6.2 ratio — ABNORMAL HIGH (ref 0.0–5.0)
Cholesterol, Total: 271 mg/dL — ABNORMAL HIGH (ref 100–199)
HDL: 44 mg/dL (ref >39)
Triglycerides: 466 mg/dL — ABNORMAL HIGH (ref 0–149)

## 2018-06-14 LAB — BASIC METABOLIC PANEL
BUN / CREAT RATIO: 8 — AB (ref 9–20)
BUN: 14 mg/dL (ref 6–24)
CO2: 24 mmol/L (ref 20–29)
Calcium: 9.4 mg/dL (ref 8.7–10.2)
Chloride: 103 mmol/L (ref 96–106)
Creatinine, Ser: 1.84 mg/dL — ABNORMAL HIGH (ref 0.76–1.27)
GFR calc Af Amer: 49 mL/min/{1.73_m2} — ABNORMAL LOW (ref >59)
GFR calc non Af Amer: 42 mL/min/{1.73_m2} — ABNORMAL LOW (ref >59)
Glucose: 92 mg/dL (ref 65–99)
Potassium: 4.6 mmol/L (ref 3.5–5.2)
SODIUM: 142 mmol/L (ref 134–144)

## 2018-06-14 LAB — LDL CHOLESTEROL, DIRECT: LDL Direct: 138 mg/dL — ABNORMAL HIGH (ref 0–99)

## 2018-06-14 LAB — HEMOGLOBIN A1C
ESTIMATED AVERAGE GLUCOSE: 108 mg/dL
HEMOGLOBIN A1C: 5.4 % (ref 4.8–5.6)

## 2018-07-04 ENCOUNTER — Ambulatory Visit: Payer: Medicaid Other | Admitting: Ophthalmology

## 2018-07-04 ENCOUNTER — Ambulatory Visit: Payer: Medicaid Other | Admitting: Licensed Clinical Social Worker

## 2018-07-04 ENCOUNTER — Ambulatory Visit: Payer: Medicaid Other

## 2018-07-04 DIAGNOSIS — I10 Essential (primary) hypertension: Secondary | ICD-10-CM

## 2018-07-04 MED ORDER — HYDROCHLOROTHIAZIDE 25 MG PO TABS: 25.0000 mg | ORAL_TABLET | Freq: Every day | ORAL | 0 refills | Status: DC

## 2018-07-11 ENCOUNTER — Telehealth: Payer: Self-pay | Admitting: Licensed Clinical Social Worker

## 2018-07-11 ENCOUNTER — Ambulatory Visit: Payer: Medicaid Other | Admitting: Licensed Clinical Social Worker

## 2018-07-11 ENCOUNTER — Ambulatory Visit: Payer: Medicaid Other | Admitting: Adult Health Nurse Practitioner

## 2018-07-11 ENCOUNTER — Other Ambulatory Visit: Payer: Self-pay

## 2018-07-11 VITALS — BP 148/92 | HR 90 | Temp 98.4°F | Ht 68.0 in | Wt 212.6 lb

## 2018-07-11 DIAGNOSIS — I10 Essential (primary) hypertension: Secondary | ICD-10-CM

## 2018-07-11 NOTE — Telephone Encounter (Signed)
Clinician reached out to the patient to conduct a preliminary screening for anyone exposed to flu or coronavirus. He denies any symptoms and hasn't been exposed.

## 2018-07-11 NOTE — Patient Instructions (Signed)

## 2018-07-11 NOTE — Progress Notes (Signed)
  Patient: Christopher Harrell Male    DOB: 23-May-1970   48 y.o.   MRN: [id number] Visit Date: 07/11/2018  Today's Provider: T[place]an   Chief Complaint  Patient presents with  . Follow-up   Subjective:    HPI  Last visit BP was 204/122- Norvasc and Lisinopril was increased.  Taking medications as directed.  Has made diet changes and started exercising.   LDL-138 on labs    No Known Allergies Previous Medications   ALBUTEROL (PROVENTIL HFA;VENTOLIN HFA) 108 (90 BASE) MCG/ACT INHALER    Inhale 2 puffs into the lungs every 6 (six) hours as needed for wheezing or shortness of breath.   AMLODIPINE (NORVASC) 10 MG TABLET    Take 1 tablet (10 mg total) by mouth daily.   HYDROCHLOROTHIAZIDE (HYDRODIURIL) 25 MG TABLET    Take 1 tablet (25 mg total) by mouth daily.   LISINOPRIL (PRINIVIL,ZESTRIL) 40 MG TABLET    Take 1 tablet (40 mg total) by mouth daily.   NAPROXEN (NAPROSYN) 500 MG TABLET    Take 1 tablet (500 mg total) by mouth 2 (two) times daily as needed. Take with meals   PROCHLORPERAZINE (COMPAZINE) 10 MG TABLET    Take 1 tablet (10 mg total) by mouth every 6 (six) hours as needed for nausea (headache).    Review of Systems  All other systems reviewed and are negative.   Social History   Tobacco Use  . Smoking status: Current Every Day Smoker    Packs/day: 0.50    Types: Cigarettes  . Smokeless tobacco: Never Used  Substance Use Topics  . Alcohol use: No   Objective:   BP (!) 148/92 (BP Location: Left Arm, Patient Position: Sitting, Cuff Size: Normal)   Pulse 90   Temp 98.4 F (36.9 C) (Oral)   Ht 5\' 8"  (1.727 m)   Wt 212 lb 9.6 oz (96.4 kg)   BMI 32.33 kg/m   Physical Exam Constitutional:      Appearance: Normal appearance.  Neck:     Musculoskeletal: Normal range of motion and neck supple.  Cardiovascular:     Rate and Rhythm: Normal rate and regular rhythm.  Pulmonary:     Effort: Pulmonary effort is normal.     Breath sounds: Normal breath  sounds.  Abdominal:     General: Bowel sounds are normal.     Palpations: Abdomen is soft.  Skin:    General: Skin is warm and dry.  Neurological:     Mental Status: He is alert.         Assessment & Plan:         HTN:  Improved.  Goal BP <140/90.  Continue current medication regimen.  Encourage low salt diet and exercise.   HLD:   Not Controlled.  Continue current regimen.  Encourage low cholesterol, low fat diet and exercise.  Information given- he would like to work on diet and exercise before medications.   Repeat BMET tonight- crt elevated on last labs.   Labs reviewed.     [facility]

## 2018-07-12 LAB — BASIC METABOLIC PANEL
BUN/Creatinine Ratio: 13 (ref 9–20)
BUN: 11 mg/dL (ref 6–24)
CALCIUM: 9.8 mg/dL (ref 8.7–10.2)
CO2: 25 mmol/L (ref 20–29)
Chloride: 99 mmol/L (ref 96–106)
Creatinine, Ser: 0.84 mg/dL (ref 0.76–1.27)
GFR calc Af Amer: 119 mL/min/{1.73_m2} (ref >59)
GFR calc non Af Amer: 103 mL/min/{1.73_m2} (ref >59)
Glucose: 110 mg/dL — ABNORMAL HIGH (ref 65–99)
POTASSIUM: 4 mmol/L (ref 3.5–5.2)
Sodium: 141 mmol/L (ref 134–144)

## 2018-08-06 NOTE — Progress Notes (Deleted)
Virtual Visit via Video Note The purpose of this virtual visit is to provide medical care while limiting exposure to the novel coronavirus.    Consent was obtained for video visit:  Yes.   Answered questions that patient had about telehealth interaction:  Yes.   I discussed the limitations, risks, security and privacy concerns of performing an evaluation and management service by telemedicine. I also discussed with the patient that there may be a patient responsible charge related to this service. The patient expressed understanding and agreed to proceed.  Pt location: Home Physician Location: office Name of referring provider:  Nori Riis, PA-C I connected with Christopher Harrell at patients initiation/request on 08/07/2018 at  9:00 AM EDT by video enabled telemedicine application and verified that I am speaking with the correct person using two identifiers. Pt MRN:  [id number] Pt DOB:  1971-01-25 Video Participants:  Christopher Harrell   History of Present Illness:  Christopher Harrell is a 48 year old ***-handed African American man with hypertension who presents for migraines.  History supplemented by referring provider note.  Onset:  ***.  He has had uncontrolled hypertension as well and had also been experiencing dizziness and shortness of breath.  He had not been taking his blood pressure medications.  He went to the ED on 05/31/18 for further treatment.  Initial troponin was borderline elevated at 0.03, however repeat lab was normal.  CT head was personally reviewed and was unremarkable.  He was treated with IV hydralazine, Compazine and Benadryl. Location:  Left sided Quality:  *** Intensity:  ***.  *** denies new headache, thunderclap headache or severe headache that wakes *** from sleep. Aura:  *** Premonitory Phase:  *** Postdrome:  *** Associated symptoms:  ***.  *** denies associated unilateral numbness or weakness. Duration:  *** Frequency:  *** Frequency of abortive  medication: *** Triggers:  *** Exacerbating factors:  *** Relieving factors:  *** Activity:  ***  Current NSAIDS:  Naproxen 500mg  Current analgesics:  *** Current triptans:  none Current ergotamine:  none Current anti-emetic:  Compazine 10mg  Current muscle relaxants:  none Current anti-anxiolytic:  none Current sleep aide:  none Current Antihypertensive medications:  Amlodipine 10mg , HCTZ 25mg , lisinopril 40mg  Current Antidepressant medications:  none Current Anticonvulsant medications:  none Current anti-CGRP:  none Current Vitamins/Herbal/Supplements:  none Current Antihistamines/Decongestants:  none Other therapy:  none Hormone/birth control:  none Other medications:  albuterol  Past NSAIDS:  *** Past analgesics:  *** Past abortive triptans:  *** Past abortive ergotamine:  *** Past muscle relaxants:  *** Past anti-emetic:  Zofran ODT 4mg  Past antihypertensive medications:  losartan Past antidepressant medications:  *** Past anticonvulsant medications:  *** Past anti-CGRP:  *** Past vitamins/Herbal/Supplements:  *** Past antihistamines/decongestants:  *** Other past therapies:  ***  Caffeine:  *** Alcohol:  *** Smoker:  *** Diet:  *** Exercise:  *** Depression:  ***; Anxiety:  *** Other pain:  *** Sleep hygiene:  *** Family history of headache:  ***  BMP from 07/11/18 was normal except for mildly elevated glucose of 110.  Past Medical History: Past Medical History:  Diagnosis Date  . Community acquired pneumonia    a. Dx 01/21/2016 - LLL PNA.  . Essential hypertension   . Tobacco abuse    Surgical History: Past Surgical History:  Procedure Laterality Date  . NO PAST SURGERIES     Medications: ***  Allergies: No Known Allergies  Family History: Family History  Problem Relation Age of Onset  .  Hypertension Mother   . Hypertension Father   . Heart attack Maternal Grandmother     Social History: Social History   Socioeconomic History  .  Marital status: Married    Spouse name: Not on file  . Number of children: Not on file  . Years of education: Not on file  . Highest education level: Not on file  Occupational History  . Not on file  Social Needs  . Financial resource strain: Not on file  . Food insecurity:    Worry: Not on file    Inability: Not on file  . Transportation needs:    Medical: Not on file    Non-medical: Not on file  Tobacco Use  . Smoking status: Current Every Day Smoker    Packs/day: 0.50    Types: Cigarettes  . Smokeless tobacco: Never Used  Substance and Sexual Activity  . Alcohol use: No  . Drug use: No  . Sexual activity: Not on file  Lifestyle  . Physical activity:    Days per week: Not on file    Minutes per session: Not on file  . Stress: Not on file  Relationships  . Social connections:    Talks on phone: Not on file    Gets together: Not on file    Attends religious service: Not on file    Active member of club or organization: Not on file    Attends meetings of clubs or organizations: Not on file    Relationship status: Not on file  . Intimate partner violence:    Fear of current or ex partner: Not on file    Emotionally abused: Not on file    Physically abused: Not on file    Forced sexual activity: Not on file  Other Topics Concern  . Not on file  Social History Narrative   Lives locally with his wife.  Does not routinely exercise.    Review Of Systems: ***    Observations/Objective:   There were no vitals filed for this visit.    Assessment and Plan:     Follow Up Instructions:    -I discussed the assessment and treatment plan with the patient. The patient was provided an opportunity to ask questions and all were answered. The patient agreed with the plan and demonstrated an understanding of the instructions.   The patient was advised to call back or seek an in-person evaluation if the symptoms worsen or if the condition fails to improve as anticipated.     Total Time spent in visit with the patient was:  ***, of which more than 50% of the time was spent in counseling and/or coordinating care on ***.   Pt understands and agrees with the plan of care outlined.     Dudley Major, DO

## 2018-08-07 ENCOUNTER — Other Ambulatory Visit: Payer: Self-pay

## 2018-08-07 ENCOUNTER — Telehealth: Payer: Self-pay | Admitting: Neurology

## 2018-08-15 ENCOUNTER — Other Ambulatory Visit: Payer: Self-pay

## 2018-08-15 DIAGNOSIS — I10 Essential (primary) hypertension: Secondary | ICD-10-CM

## 2018-08-15 MED ORDER — AMLODIPINE BESYLATE 10 MG PO TABS: 10.0000 mg | ORAL_TABLET | Freq: Every day | ORAL | 2 refills | Status: DC

## 2018-08-15 MED ORDER — HYDROCHLOROTHIAZIDE 25 MG PO TABS: 25.0000 mg | ORAL_TABLET | Freq: Every day | ORAL | 2 refills | Status: DC

## 2018-08-16 ENCOUNTER — Ambulatory Visit: Payer: Self-pay | Admitting: Neurology

## 2018-09-10 ENCOUNTER — Other Ambulatory Visit: Payer: Self-pay

## 2018-09-10 ENCOUNTER — Emergency Department
Admission: EM | Admit: 2018-09-10 | Discharge: 2018-09-10 | Disposition: A | Discharge status: Home / Self Care | Payer: Self-pay | Attending: Student in an Organized Health Care Education/Training Program | Admitting: Student in an Organized Health Care Education/Training Program

## 2018-09-10 ENCOUNTER — Emergency Department: Payer: Self-pay

## 2018-09-10 ENCOUNTER — Encounter: Payer: Self-pay | Admitting: Emergency Medicine

## 2018-09-10 DIAGNOSIS — I10 Essential (primary) hypertension: Secondary | ICD-10-CM | POA: Insufficient documentation

## 2018-09-10 DIAGNOSIS — R109 Unspecified abdominal pain: Secondary | ICD-10-CM

## 2018-09-10 DIAGNOSIS — Z1159 Encounter for screening for other viral diseases: Secondary | ICD-10-CM | POA: Insufficient documentation

## 2018-09-10 DIAGNOSIS — F17210 Nicotine dependence, cigarettes, uncomplicated: Secondary | ICD-10-CM | POA: Insufficient documentation

## 2018-09-10 DIAGNOSIS — J449 Chronic obstructive pulmonary disease, unspecified: Secondary | ICD-10-CM | POA: Insufficient documentation

## 2018-09-10 DIAGNOSIS — Z79899 Other long term (current) drug therapy: Secondary | ICD-10-CM | POA: Insufficient documentation

## 2018-09-10 DIAGNOSIS — R1084 Generalized abdominal pain: Secondary | ICD-10-CM | POA: Insufficient documentation

## 2018-09-10 LAB — CBC
HCT: 49.5 % (ref 39.0–52.0)
Hemoglobin: 17.4 g/dL — ABNORMAL HIGH (ref 13.0–17.0)
MCH: 30.4 pg (ref 26.0–34.0)
MCHC: 35.2 g/dL (ref 30.0–36.0)
MCV: 86.4 fL (ref 80.0–100.0)
Platelets: 311 10*3/uL (ref 150–400)
RBC: 5.73 MIL/uL (ref 4.22–5.81)
RDW: 12.9 % (ref 11.5–15.5)
WBC: 5.9 10*3/uL (ref 4.0–10.5)
nRBC: 0 % (ref 0.0–0.2)

## 2018-09-10 LAB — COMPREHENSIVE METABOLIC PANEL
ALT: 18 U/L (ref 0–44)
AST: 16 U/L (ref 15–41)
Albumin: 4.5 g/dL (ref 3.5–5.0)
Alkaline Phosphatase: 72 U/L (ref 38–126)
Anion gap: 13 (ref 5–15)
BUN: 10 mg/dL (ref 6–20)
CO2: 27 mmol/L (ref 22–32)
Calcium: 9.6 mg/dL (ref 8.9–10.3)
Chloride: 96 mmol/L — ABNORMAL LOW (ref 98–111)
Creatinine, Ser: 0.87 mg/dL (ref 0.61–1.24)
GFR calc Af Amer: >60 mL/min (ref >60)
GFR calc non Af Amer: >60 mL/min (ref >60)
Glucose, Bld: 107 mg/dL — ABNORMAL HIGH (ref 70–99)
Potassium: 3.8 mmol/L (ref 3.5–5.1)
Sodium: 136 mmol/L (ref 135–145)
Total Bilirubin: 1.1 mg/dL (ref 0.3–1.2)
Total Protein: 8.3 g/dL — ABNORMAL HIGH (ref 6.5–8.1)

## 2018-09-10 LAB — URINALYSIS, COMPLETE (UACMP) WITH MICROSCOPIC
Bacteria, UA: NONE SEEN
Bilirubin Urine: NEGATIVE
Glucose, UA: NEGATIVE mg/dL
Hgb urine dipstick: NEGATIVE
Ketones, ur: 5 mg/dL — AB
Leukocytes,Ua: NEGATIVE
Nitrite: NEGATIVE
Protein, ur: 30 mg/dL — AB
Specific Gravity, Urine: 1.017 (ref 1.005–1.030)
Squamous Epithelial / HPF: NONE SEEN (ref 0–5)
pH: 8 (ref 5.0–8.0)

## 2018-09-10 LAB — LIPASE, BLOOD: Lipase: 50 U/L (ref 11–51)

## 2018-09-10 MED ORDER — PROMETHAZINE HCL 12.5 MG PO TABS: 12.5000 mg | ORAL_TABLET | Freq: Four times a day (QID) | ORAL | 0 refills | Status: DC | PRN

## 2018-09-10 MED ORDER — ACETAMINOPHEN 500 MG PO TABS
1000.0000 mg | ORAL_TABLET | Freq: Once | ORAL | Status: AC
  Administered 2018-09-10: 1000 mg via ORAL
  Filled 2018-09-10: qty 2

## 2018-09-10 MED ORDER — MORPHINE SULFATE (PF) 4 MG/ML IV SOLN
4.0000 mg | INTRAVENOUS | Status: DC | PRN
  Administered 2018-09-10: 4 mg via INTRAVENOUS
  Filled 2018-09-10: qty 1

## 2018-09-10 MED ORDER — ONDANSETRON HCL 4 MG/2ML IJ SOLN
4.0000 mg | Freq: Once | INTRAMUSCULAR | Status: AC
  Administered 2018-09-10: 4 mg via INTRAVENOUS
  Filled 2018-09-10: qty 2

## 2018-09-10 MED ORDER — SODIUM CHLORIDE 0.9% FLUSH: 3.0000 mL | Freq: Once | INTRAVENOUS | Status: DC

## 2018-09-10 MED ORDER — DICYCLOMINE HCL 10 MG PO CAPS: 10.0000 mg | ORAL_CAPSULE | Freq: Three times a day (TID) | ORAL | 0 refills | Status: DC | PRN

## 2018-09-10 MED ORDER — POLYETHYLENE GLYCOL 3350 17 G PO PACK: 17.0000 g | PACK | Freq: Every day | ORAL | 0 refills | Status: DC

## 2018-09-10 MED ORDER — IOHEXOL 300 MG/ML  SOLN
100.0000 mL | Freq: Once | INTRAMUSCULAR | Status: AC | PRN
  Administered 2018-09-10: 100 mL via INTRAVENOUS

## 2018-09-10 NOTE — ED Notes (Signed)
Patient AAOx4. Vitals Stable. NAD. 

## 2018-09-10 NOTE — Discharge Instructions (Signed)

## 2018-09-10 NOTE — ED Triage Notes (Signed)
Last bm was 3 days ago and "not much."

## 2018-09-10 NOTE — ED Triage Notes (Signed)
Pt states lower abd pain, gas and bloating for the past few days, had chinese food a week ago and hasn't felt "right" since, has had nausea with no vomiting, has been taking stool softener with no relief, pt states he is in a lot of pain and is very uncomfortable, NAD.

## 2018-09-10 NOTE — ED Provider Notes (Signed)
[facility] Emergency Department Provider Note    First MD Initiated Contact with Patient 09/10/18 1503     (approximate)  I have reviewed the triage vital signs and the nursing notes.   HISTORY  Chief Complaint Abdominal Pain and Constipation    HPI Christopher Harrell is a 48 y.o. male below listed past medical history presents the ER for diffuse abdominal pain and cramping.  States he is not moved his bowels in 3 days.  Feels "stopped up ." Denies any vomiting but has had some nausea.  No fevers.  Feels bloated.  States his symptoms started after he ate [place] food take out over the weekend.  Has not had any hematochezia or melena.  Is never had symptoms like this before.  Tried milk of magnesia without improvement.   Past Medical History:  Diagnosis Date  . Community acquired pneumonia    a. Dx 01/21/2016 - LLL PNA.  . Essential hypertension   . Tobacco abuse    Family History  Problem Relation Age of Onset  . Hypertension Mother   . Hypertension Father   . Heart attack Maternal Grandmother    Past Surgical History:  Procedure Laterality Date  . NO PAST SURGERIES     Patient Active Problem List   Diagnosis Date Noted  . Hypertriglyceridemia 06/05/2017  . COPD with acute exacerbation ([place]) 10/14/2016  . Pneumonia 10/14/2016  . Hypertensive urgency, malignant 06/13/2016  . Tobacco abuse 06/13/2016  . Community acquired pneumonia 01/23/2016  . Elevated troponin 01/23/2016  . HTN (hypertension) 01/23/2016  . Chest pain 01/23/2016      Prior to Admission medications   Medication Sig Start Date End Date Taking? Authorizing Provider  albuterol (PROVENTIL HFA;VENTOLIN HFA) 108 (90 Base) MCG/ACT inhaler Inhale 2 puffs into the lungs every 6 (six) hours as needed for wheezing or shortness of breath. Patient not taking: Reported on 06/13/2018 01/10/18   Erlene Quan, NP  amLODipine (NORVASC) 10 MG tablet Take 1 tablet (10 mg total) by  mouth daily. 08/15/18   Doles-Johnson, Teah, NP  hydrochlorothiazide (HYDRODIURIL) 25 MG tablet Take 1 tablet (25 mg total) by mouth daily for 30 days. 08/15/18 09/14/18  Doles-Johnson, Teah, NP  lisinopril (PRINIVIL,ZESTRIL) 40 MG tablet Take 1 tablet (40 mg total) by mouth daily. 06/13/18   Doles-Johnson, Teah, NP  naproxen (NAPROSYN) 500 MG tablet Take 1 tablet (500 mg total) by mouth 2 (two) times daily as needed. Take with meals Patient not taking: Reported on 07/11/2018 01/10/18   Erlene Quan, NP  prochlorperazine (COMPAZINE) 10 MG tablet Take 1 tablet (10 mg total) by mouth every 6 (six) hours as needed for nausea (headache). Patient not taking: Reported on 07/11/2018 06/01/18   Paulette Blanch, MD    Allergies Patient has no known allergies.    Social History Social History   Tobacco Use  . Smoking status: Current Every Day Smoker    Packs/day: 0.50    Types: Cigarettes  . Smokeless tobacco: Never Used  Substance Use Topics  . Alcohol use: No  . Drug use: No    Review of Systems Patient denies headaches, rhinorrhea, blurry vision, numbness, shortness of breath, chest pain, edema, cough, abdominal pain, nausea, vomiting, diarrhea, dysuria, fevers, rashes or hallucinations unless otherwise stated above in HPI. ____________________________________________   PHYSICAL EXAM:  VITAL SIGNS: Vitals:   09/10/18 1327 09/10/18 1329  BP:  (!) 165/115  Pulse: 97   Resp: 18   Temp: 98.8 F (  37.1 C)   SpO2: 100%     Constitutional: Alert and oriented.  Eyes: Conjunctivae are normal.  Head: Atraumatic. Nose: No congestion/rhinnorhea. Mouth/Throat: Mucous membranes are moist.   Neck: No stridor. Painless ROM.  Cardiovascular: Normal rate, regular rhythm. Grossly normal heart sounds.  Good peripheral circulation. Respiratory: Normal respiratory effort.  No retractions. Lungs CTAB. Gastrointestinal: Soft and nontender in all four quadrants. No distention. No abdominal bruits.  No CVA tenderness. Genitourinary:  Musculoskeletal: No lower extremity tenderness nor edema.  No joint effusions. Neurologic:  Normal speech and language. No gross focal neurologic deficits are appreciated. No facial droop Skin:  Skin is warm, dry and intact. No rash noted. Psychiatric: Mood and affect are normal. Speech and behavior are normal.  ____________________________________________   LABS (all labs ordered are listed, but only abnormal results are displayed)  Results for orders placed or performed during the hospital encounter of 09/10/18 (from the past 24 hour(s))  Lipase, blood     Status: None   Collection Time: 09/10/18  1:38 PM  Result Value Ref Range   Lipase 50 11 - 51 U/L  Comprehensive metabolic panel     Status: Abnormal   Collection Time: 09/10/18  1:38 PM  Result Value Ref Range   Sodium 136 135 - 145 mmol/L   Potassium 3.8 3.5 - 5.1 mmol/L   Chloride 96 (L) 98 - 111 mmol/L   CO2 27 22 - 32 mmol/L   Glucose, Bld 107 (H) 70 - 99 mg/dL   BUN 10 6 - 20 mg/dL   Creatinine, Ser 0.87 0.61 - 1.24 mg/dL   Calcium 9.6 8.9 - 10.3 mg/dL   Total Protein 8.3 (H) 6.5 - 8.1 g/dL   Albumin 4.5 3.5 - 5.0 g/dL   AST 16 15 - 41 U/L   ALT 18 0 - 44 U/L   Alkaline Phosphatase 72 38 - 126 U/L   Total Bilirubin 1.1 0.3 - 1.2 mg/dL   GFR calc non Af Amer >60 >60 mL/min   GFR calc Af Amer >60 >60 mL/min   Anion gap 13 5 - 15  CBC     Status: Abnormal   Collection Time: 09/10/18  1:38 PM  Result Value Ref Range   WBC 5.9 4.0 - 10.5 K/uL   RBC 5.73 4.22 - 5.81 MIL/uL   Hemoglobin 17.4 (H) 13.0 - 17.0 g/dL   HCT 49.5 39.0 - 52.0 %   MCV 86.4 80.0 - 100.0 fL   MCH 30.4 26.0 - 34.0 pg   MCHC 35.2 30.0 - 36.0 g/dL   RDW 12.9 11.5 - 15.5 %   Platelets 311 150 - 400 K/uL   nRBC 0.0 0.0 - 0.2 %   ____________________________________________ ____________________________________________  RADIOLOGY  I personally reviewed all radiographic images ordered to evaluate for the  above acute complaints and reviewed radiology reports and findings.  These findings were personally discussed with the patient.  Please see medical record for radiology report.  ____________________________________________   PROCEDURES  Procedure(s) performed:  Procedures    Critical Care performed: no ____________________________________________   INITIAL IMPRESSION / ASSESSMENT AND PLAN / ED COURSE  Pertinent labs & imaging results that were available during my care of the patient were reviewed by me and considered in my medical decision making (see chart for details).   DDX: constipation, obstipation, obstruction, enteritis, colitis  Christopher Harrell is a 48 y.o. who presents to the ED with symptoms as described above.  Blood was reassuring but patient does  have abnormal gas pattern on KUB and described not moving his bowels for 3 days concerning for obstruction or mass.  Doubt infectious process.  CT imaging ordered to further characterize does not show any evidence of mass or obstruction.  No evidence of diverticulitis.  Possible colitis.  Will give antiemetics antispasmodic and follow-up with GI.  Have discussed with the patient and available family all diagnostics and treatments performed thus far and all questions were answered to the best of my ability. The patient demonstrates understanding and agreement with plan.      The patient was evaluated in Emergency Department today for the symptoms described in the history of present illness. He/she was evaluated in the context of the global COVID-19 pandemic, which necessitated consideration that the patient might be at risk for infection with the SARS-CoV-2 virus that causes COVID-19. Institutional protocols and algorithms that pertain to the evaluation of patients at risk for COVID-19 are in a state of rapid change based on information released by regulatory bodies including the CDC and federal and state organizations. These policies  and algorithms were followed during the patient's care in the ED. As part of my medical decision making, I reviewed the following data within the [place] notes reviewed and incorporated, Labs reviewed, notes from prior ED visits and [place] Controlled Substance Database   ____________________________________________   FINAL CLINICAL IMPRESSION(S) / ED DIAGNOSES  Final diagnoses:  Abdominal pain      NEW MEDICATIONS STARTED DURING THIS VISIT:  New Prescriptions   No medications on file     Note:  This document was prepared using Dragon voice recognition software and may include unintentional dictation errors.    Merlyn Lot, MD 09/10/18 1616

## 2018-11-14 ENCOUNTER — Other Ambulatory Visit: Payer: Self-pay

## 2018-11-14 ENCOUNTER — Ambulatory Visit: Payer: Medicaid Other | Admitting: Obstetrics and Gynecology

## 2018-11-14 DIAGNOSIS — I10 Essential (primary) hypertension: Secondary | ICD-10-CM

## 2018-11-14 MED ORDER — AMLODIPINE BESYLATE 10 MG PO TABS: 10.0000 mg | ORAL_TABLET | Freq: Every day | ORAL | 0 refills | Status: DC

## 2018-11-14 MED ORDER — TRIAMTERENE-HCTZ 37.5-25 MG PO CAPS: 1.0000 | ORAL_CAPSULE | Freq: Every day | ORAL | 0 refills | Status: DC

## 2018-11-14 MED ORDER — LISINOPRIL 20 MG PO TABS: 20.0000 mg | ORAL_TABLET | Freq: Every day | ORAL | 0 refills | Status: DC

## 2018-11-14 NOTE — Patient Instructions (Signed)
I value your feedback and entrusting us with your care. If you get a [facility] patient survey, I would appreciate you taking the time to let us know about your experience today. Thank you! 

## 2018-11-14 NOTE — Progress Notes (Signed)
Patient, No Pcp Per   Chief Complaint  Patient presents with  . Hypertension    HPI:      Ms. Torres Hardenbrook is a 48 y.o. No obstetric history on file. who LMP was No LMP for male patient., presents today for HTN f/u. Prescribed amlodipine, HCTZ, and lisinopril 40mg . Pt has been taking 20 mg dose lisinopril for past 2 wks due to feeling fatigued and having ED with 40 mg dose. Sx improved and Would prefer to stay on 20 mg. BP not controlled with decreased dose, however. No other side effects. Last CMP 5/20.   Past Medical History:  Diagnosis Date  . Community acquired pneumonia    a. Dx 01/21/2016 - LLL PNA.  . Essential hypertension   . Tobacco abuse     Past Surgical History:  Procedure Laterality Date  . NO PAST SURGERIES      Family History  Problem Relation Age of Onset  . Hypertension Mother   . Hypertension Father   . Heart attack Maternal Grandmother     Social History   Socioeconomic History  . Marital status: Legally Separated    Spouse name: Not on file  . Number of children: Not on file  . Years of education: Not on file  . Highest education level: Not on file  Occupational History  . Not on file  Social Needs  . Financial resource strain: Not hard at all  . Food insecurity    Worry: Never true    Inability: Never true  . Transportation needs    Medical: Not on file    Non-medical: Not on file  Tobacco Use  . Smoking status: Current Every Day Smoker    Packs/day: 0.50    Types: Cigarettes  . Smokeless tobacco: Never Used  Substance and Sexual Activity  . Alcohol use: No  . Drug use: No  . Sexual activity: Yes  Lifestyle  . Physical activity    Days per week: 5 days    Minutes per session: 20 min  . Stress: Not at all  Relationships  . Social connections    Talks on phone: More than three times a week    Gets together: More than three times a week    Attends religious service: More than 4 times per year    Active member of club  or organization: Yes    Attends meetings of clubs or organizations: 1 to 4 times per year    Relationship status: Separated  . Intimate partner violence    Fear of current or ex partner: No    Emotionally abused: No    Physically abused: No    Forced sexual activity: No  Other Topics Concern  . Not on file  Social History Narrative   No longer living with wife. Walking for 20 mins per day    Outpatient Medications Prior to Visit  Medication Sig Dispense Refill  . Multiple Vitamin (MULTIVITAMIN WITH MINERALS) TABS tablet Take 1 tablet by mouth daily.    . naproxen (NAPROSYN) 500 MG tablet Take 1 tablet (500 mg total) by mouth 2 (two) times daily as needed. Take with meals 30 tablet 1  . amLODipine (NORVASC) 10 MG tablet Take 1 tablet (10 mg total) by mouth daily. 30 tablet 2  . albuterol (PROVENTIL HFA;VENTOLIN HFA) 108 (90 Base) MCG/ACT inhaler Inhale 2 puffs into the lungs every 6 (six) hours as needed for wheezing or shortness of breath. (Patient not  taking: Reported on 06/13/2018) 1 Inhaler 3  . aspirin EC 81 MG tablet Take 81 mg by mouth daily.    Marland Kitchen dicyclomine (BENTYL) 10 MG capsule Take 1 capsule (10 mg total) by mouth 3 (three) times daily as needed for up to 14 days for spasms. 16 capsule 0  . polyethylene glycol (MIRALAX / GLYCOLAX) 17 g packet Take 17 g by mouth daily. Mix one tablespoon with 8oz of your favorite juice or water every day until you are having soft formed stools. Then start taking once daily if you didn't have a stool the day before. (Patient not taking: Reported on 11/14/2018) 30 each 0  . prochlorperazine (COMPAZINE) 10 MG tablet Take 1 tablet (10 mg total) by mouth every 6 (six) hours as needed for nausea (headache). (Patient not taking: Reported on 07/11/2018) 20 tablet 0  . promethazine (PHENERGAN) 12.5 MG tablet Take 1 tablet (12.5 mg total) by mouth every 6 (six) hours as needed for nausea or vomiting. (Patient not taking: Reported on 11/14/2018) 12 tablet 0  .  hydrochlorothiazide (HYDRODIURIL) 25 MG tablet Take 1 tablet (25 mg total) by mouth daily for 30 days. 30 tablet 2  . lisinopril (PRINIVIL,ZESTRIL) 40 MG tablet Take 1 tablet (40 mg total) by mouth daily. (Patient not taking: Reported on 11/14/2018) 30 tablet 3   No facility-administered medications prior to visit.      OBJECTIVE:   Vitals:  BP (!) 155/105   Pulse 95   Temp 98.6 F (37 C)   Ht 5\' 9"  (1.753 m)   Wt 213 lb (96.6 kg)   SpO2 97%   BMI 31.45 kg/m    Assessment/Plan: Essential hypertension - Plan: triamterene-hydrochlorothiazide (DYAZIDE) 37.5-25 MG capsule, lisinopril (ZESTRIL) 20 MG tablet, amLODipine (NORVASC) 10 MG tablet,   Discussed with Teah. Will add triamterene to regimen and stay at lisinopril 20 mg daily. Cont HCTZ and amlodipine doses. Rx eRxd. RTO in 2 wks for BP f/u and CMP.    Meds ordered this encounter  Medications  . triamterene-hydrochlorothiazide (DYAZIDE) 37.5-25 MG capsule    Sig: Take 1 each (1 capsule total) by mouth daily.    Dispense:  30 capsule    Refill:  0    Order Specific Question:   Supervising Provider    Answer:   Gae Dry [id number]  . lisinopril (ZESTRIL) 20 MG tablet    Sig: Take 1 tablet (20 mg total) by mouth daily.    Dispense:  30 tablet    Refill:  0    Order Specific Question:   Supervising Provider    Answer:   Gae Dry [id number]  . amLODipine (NORVASC) 10 MG tablet    Sig: Take 1 tablet (10 mg total) by mouth daily.    Dispense:  30 tablet    Refill:  0    Order Specific Question:   Supervising Provider    Answer:   Gae Dry [[id number]]      Return in about 2 weeks (around 11/28/2018) for BPf/u and CMP.  Noe Pittsley B. Orson Rho, PA-C 11/14/2018 6:47 PM

## 2018-11-28 ENCOUNTER — Ambulatory Visit: Payer: Medicaid Other

## 2018-12-12 ENCOUNTER — Ambulatory Visit: Payer: Medicaid Other

## 2018-12-14 ENCOUNTER — Other Ambulatory Visit: Payer: Self-pay | Admitting: Obstetrics and Gynecology

## 2018-12-14 DIAGNOSIS — I10 Essential (primary) hypertension: Secondary | ICD-10-CM

## 2018-12-15 ENCOUNTER — Other Ambulatory Visit: Payer: Self-pay | Admitting: Obstetrics and Gynecology

## 2018-12-15 DIAGNOSIS — I10 Essential (primary) hypertension: Secondary | ICD-10-CM

## 2019-01-05 ENCOUNTER — Other Ambulatory Visit: Payer: Self-pay | Admitting: Obstetrics and Gynecology

## 2019-01-05 DIAGNOSIS — I10 Essential (primary) hypertension: Secondary | ICD-10-CM

## 2019-03-20 ENCOUNTER — Ambulatory Visit: Payer: Self-pay | Admitting: Physician Assistant

## 2019-03-20 ENCOUNTER — Encounter: Payer: Self-pay | Admitting: Physician Assistant

## 2019-03-20 ENCOUNTER — Other Ambulatory Visit: Payer: Self-pay

## 2019-03-20 VITALS — BP 184/122

## 2019-03-20 DIAGNOSIS — Z113 Encounter for screening for infections with a predominantly sexual mode of transmission: Secondary | ICD-10-CM

## 2019-03-20 DIAGNOSIS — Z202 Contact with and (suspected) exposure to infections with a predominantly sexual mode of transmission: Secondary | ICD-10-CM

## 2019-03-20 MED ORDER — METRONIDAZOLE 500 MG PO TABS: 2000.0000 mg | ORAL_TABLET | Freq: Once | ORAL | 0 refills | Status: AC

## 2019-03-20 NOTE — Progress Notes (Signed)
   STI clinic/screening visit  Subjective:  Christopher Harrell is a 47 y.o. male being seen today for an STI screening visit. His male partner of 2 years was dx with Trichomonas 2 days ago and was treated. She confirmed this dx by phone during his visit today. The patient reports they do have symptoms.  Patient has the following medical conditions:   Patient Active Problem List   Diagnosis Date Noted  . Hypertriglyceridemia 06/05/2017  . COPD with acute exacerbation ([place]) 10/14/2016  . Pneumonia 10/14/2016  . Hypertensive urgency, malignant 06/13/2016  . Tobacco abuse 06/13/2016  . Community acquired pneumonia 01/23/2016  . Elevated troponin 01/23/2016  . HTN (hypertension) 01/23/2016  . Chest pain 01/23/2016     Chief Complaint  Patient presents with  . Exposure to STD    HPI Pt presents for STD visit as contact to Christopher Harrell. Of note, he is followed for hypertension at the [facility] and should be taking lisinopril and HCTZ. He sometimes does not take them due to causing headaches and erectile dysfunction. He requests BP check today.   Patient reports burning and stinging with urination for 1-2 days, somewhat improved after drinking cranberry juice and water yesterday.  See flowsheet for further details and programmatic requirements.    The following portions of the patient's history were reviewed and updated as appropriate: allergies, current medications, past medical history, past social history, past surgical history and problem list.  Objective:  There were no vitals filed for this visit.  Physical Exam Well appearing man in NAD, breathing without effort. A&O x3, good judgement, mood. He declines STI-focused physical exam and STI testing. BP 184/122.   Assessment and Plan:  Christopher Harrell is a 48 y.o. male presenting to the [facility] Department for STI screening  1. Routine screening for STI (sexually transmitted infection) Declines all testing.  2.  STD exposure Treat contact to Trich per S.O. Of note, pt is encouraged to resume his BP meds and f/u with his PCP at the [facility] ASAP.     Return if symptoms worsen or fail to improve.  Future Appointments  Date Time Provider [place]  04/03/2019  6:00 PM [facility] None    Lora Havens, PA-C

## 2019-03-20 NOTE — Progress Notes (Addendum)
Patient here for STD screening.Rene Sizelove Brewer-Jensen, RN 

## 2019-03-20 NOTE — Progress Notes (Signed)
Patient counseled 5R's of smoking and given Quit card. Patient BP taken and patient states he has follow-up appointment at the [facility] in 2 weeks, and was counseled to talk to provider at that appointment about the blood pressure medication side effects he states he is having. Patient treated as contact to [place] and declined testing today.Jenetta Downer, RN

## 2019-04-03 ENCOUNTER — Ambulatory Visit: Payer: Medicaid Other

## 2019-04-17 ENCOUNTER — Encounter: Payer: Self-pay | Admitting: Intensive Care

## 2019-04-17 ENCOUNTER — Emergency Department: Payer: Self-pay

## 2019-04-17 ENCOUNTER — Other Ambulatory Visit: Payer: Self-pay

## 2019-04-17 ENCOUNTER — Emergency Department
Admission: EM | Admit: 2019-04-17 | Discharge: 2019-04-17 | Disposition: A | Discharge status: Home / Self Care | Payer: Self-pay | Attending: Emergency Medicine | Admitting: Emergency Medicine

## 2019-04-17 DIAGNOSIS — R531 Weakness: Secondary | ICD-10-CM | POA: Insufficient documentation

## 2019-04-17 DIAGNOSIS — I10 Essential (primary) hypertension: Secondary | ICD-10-CM | POA: Insufficient documentation

## 2019-04-17 DIAGNOSIS — Z5321 Procedure and treatment not carried out due to patient leaving prior to being seen by health care provider: Secondary | ICD-10-CM | POA: Insufficient documentation

## 2019-04-17 DIAGNOSIS — R519 Headache, unspecified: Secondary | ICD-10-CM | POA: Insufficient documentation

## 2019-04-17 HISTORY — DX: Hyperlipidemia, unspecified: E78.5

## 2019-04-17 LAB — CBC
HCT: 47.4 % (ref 39.0–52.0)
Hemoglobin: 17.6 g/dL — ABNORMAL HIGH (ref 13.0–17.0)
MCH: 30.2 pg (ref 26.0–34.0)
MCHC: 37.1 g/dL — ABNORMAL HIGH (ref 30.0–36.0)
MCV: 81.4 fL (ref 80.0–100.0)
Platelets: 247 10*3/uL (ref 150–400)
RBC: 5.82 MIL/uL — ABNORMAL HIGH (ref 4.22–5.81)
RDW: 13.6 % (ref 11.5–15.5)
WBC: 5.2 10*3/uL (ref 4.0–10.5)
nRBC: 0 % (ref 0.0–0.2)

## 2019-04-17 LAB — BASIC METABOLIC PANEL
Anion gap: 11 (ref 5–15)
BUN: 17 mg/dL (ref 6–20)
CO2: 25 mmol/L (ref 22–32)
Calcium: 9.7 mg/dL (ref 8.9–10.3)
Chloride: 102 mmol/L (ref 98–111)
Creatinine, Ser: 0.83 mg/dL (ref 0.61–1.24)
GFR calc Af Amer: >60 mL/min (ref >60)
GFR calc non Af Amer: >60 mL/min (ref >60)
Glucose, Bld: 106 mg/dL — ABNORMAL HIGH (ref 70–99)
Potassium: 4 mmol/L (ref 3.5–5.1)
Sodium: 138 mmol/L (ref 135–145)

## 2019-04-17 LAB — TROPONIN I (HIGH SENSITIVITY): Troponin I (High Sensitivity): 17 ng/L (ref <18)

## 2019-04-17 NOTE — ED Notes (Signed)
Patient transported to CT 

## 2019-04-17 NOTE — ED Notes (Signed)
Pt to stat desk and states he is going to leave and walk home since his family would not come to get him. Encouraged to call family. Pt called family on lobby phone and was then seen leaving waiting room.

## 2019-04-17 NOTE — ED Triage Notes (Signed)
Arrived by EMS from home for near syncopal episode and lowered himself to floor per family. noncompliant with HTN meds. C/o feeling weak and headaches X3 days. EMS vitals 234/146b/p, HR 79, 100% RA

## 2019-04-18 ENCOUNTER — Other Ambulatory Visit: Payer: Self-pay

## 2019-04-18 ENCOUNTER — Encounter: Payer: Self-pay | Admitting: *Deleted

## 2019-04-18 ENCOUNTER — Emergency Department: Payer: Self-pay

## 2019-04-18 ENCOUNTER — Telehealth: Payer: Self-pay | Admitting: Emergency Medicine

## 2019-04-18 DIAGNOSIS — R531 Weakness: Secondary | ICD-10-CM | POA: Insufficient documentation

## 2019-04-18 DIAGNOSIS — Z7982 Long term (current) use of aspirin: Secondary | ICD-10-CM | POA: Insufficient documentation

## 2019-04-18 DIAGNOSIS — F17210 Nicotine dependence, cigarettes, uncomplicated: Secondary | ICD-10-CM | POA: Insufficient documentation

## 2019-04-18 DIAGNOSIS — I10 Essential (primary) hypertension: Secondary | ICD-10-CM | POA: Insufficient documentation

## 2019-04-18 DIAGNOSIS — J449 Chronic obstructive pulmonary disease, unspecified: Secondary | ICD-10-CM | POA: Insufficient documentation

## 2019-04-18 DIAGNOSIS — Z79899 Other long term (current) drug therapy: Secondary | ICD-10-CM | POA: Insufficient documentation

## 2019-04-18 DIAGNOSIS — R42 Dizziness and giddiness: Secondary | ICD-10-CM | POA: Insufficient documentation

## 2019-04-18 DIAGNOSIS — R519 Headache, unspecified: Secondary | ICD-10-CM | POA: Insufficient documentation

## 2019-04-18 DIAGNOSIS — R112 Nausea with vomiting, unspecified: Secondary | ICD-10-CM | POA: Insufficient documentation

## 2019-04-18 LAB — COMPREHENSIVE METABOLIC PANEL
ALT: 39 U/L (ref 0–44)
AST: 26 U/L (ref 15–41)
Albumin: 5 g/dL (ref 3.5–5.0)
Alkaline Phosphatase: 61 U/L (ref 38–126)
Anion gap: 12 (ref 5–15)
BUN: 15 mg/dL (ref 6–20)
CO2: 27 mmol/L (ref 22–32)
Calcium: 9.8 mg/dL (ref 8.9–10.3)
Chloride: 100 mmol/L (ref 98–111)
Creatinine, Ser: 0.94 mg/dL (ref 0.61–1.24)
GFR calc Af Amer: >60 mL/min (ref >60)
GFR calc non Af Amer: >60 mL/min (ref >60)
Glucose, Bld: 138 mg/dL — ABNORMAL HIGH (ref 70–99)
Potassium: 4 mmol/L (ref 3.5–5.1)
Sodium: 139 mmol/L (ref 135–145)
Total Bilirubin: 1.9 mg/dL — ABNORMAL HIGH (ref 0.3–1.2)
Total Protein: 8.4 g/dL — ABNORMAL HIGH (ref 6.5–8.1)

## 2019-04-18 LAB — CBC
HCT: 50 % (ref 39.0–52.0)
Hemoglobin: 17.8 g/dL — ABNORMAL HIGH (ref 13.0–17.0)
MCH: 30.8 pg (ref 26.0–34.0)
MCHC: 35.6 g/dL (ref 30.0–36.0)
MCV: 86.5 fL (ref 80.0–100.0)
Platelets: 254 10*3/uL (ref 150–400)
RBC: 5.78 MIL/uL (ref 4.22–5.81)
RDW: 13.9 % (ref 11.5–15.5)
WBC: 7.8 10*3/uL (ref 4.0–10.5)
nRBC: 0 % (ref 0.0–0.2)

## 2019-04-18 LAB — PROTIME-INR
INR: 0.9 (ref 0.8–1.2)
Prothrombin Time: 12.5 seconds (ref 11.4–15.2)

## 2019-04-18 LAB — DIFFERENTIAL
Abs Immature Granulocytes: 0.05 10*3/uL (ref 0.00–0.07)
Basophils Absolute: 0 10*3/uL (ref 0.0–0.1)
Basophils Relative: 1 %
Eosinophils Absolute: 0 10*3/uL (ref 0.0–0.5)
Eosinophils Relative: 0 %
Immature Granulocytes: 1 %
Lymphocytes Relative: 12 %
Lymphs Abs: 0.9 10*3/uL (ref 0.7–4.0)
Monocytes Absolute: 0.3 10*3/uL (ref 0.1–1.0)
Monocytes Relative: 3 %
Neutro Abs: 6.5 10*3/uL (ref 1.7–7.7)
Neutrophils Relative %: 83 %

## 2019-04-18 LAB — TROPONIN I (HIGH SENSITIVITY): Troponin I (High Sensitivity): 11 ng/L (ref <18)

## 2019-04-18 LAB — APTT: aPTT: 28 seconds (ref 24–36)

## 2019-04-18 MED ORDER — SODIUM CHLORIDE 0.9% FLUSH: 3.0000 mL | Freq: Once | INTRAVENOUS | Status: DC

## 2019-04-18 NOTE — Telephone Encounter (Signed)
Called patient due to lwot to inquire about condition and follow up plans. He says his head is still splitting and his bp is still up as of this am about 200/135.  I told him that he really needs to be seen.  [facility], but [facility].  I told him he can come back here.  I told him that he needs to wait to be seen so we can get the bp down and that he is at great risk for stroke he agrees to return.

## 2019-04-18 NOTE — ED Triage Notes (Signed)
Pt c/o weakness, cold sweats, dizzy, drowsy, vomiting, headache, generalized pain x 4 days. Pt states vomiting x 10 in past 24 hrs. Pt states vomiting starting today.

## 2019-04-19 ENCOUNTER — Emergency Department
Admission: EM | Admit: 2019-04-19 | Discharge: 2019-04-19 | Disposition: A | Discharge status: Home / Self Care | Payer: Self-pay | Attending: Emergency Medicine | Admitting: Emergency Medicine

## 2019-04-19 ENCOUNTER — Emergency Department: Payer: Self-pay

## 2019-04-19 DIAGNOSIS — R112 Nausea with vomiting, unspecified: Secondary | ICD-10-CM

## 2019-04-19 DIAGNOSIS — F1290 Cannabis use, unspecified, uncomplicated: Secondary | ICD-10-CM

## 2019-04-19 DIAGNOSIS — I10 Essential (primary) hypertension: Secondary | ICD-10-CM

## 2019-04-19 DIAGNOSIS — R519 Headache, unspecified: Secondary | ICD-10-CM

## 2019-04-19 DIAGNOSIS — E860 Dehydration: Secondary | ICD-10-CM

## 2019-04-19 LAB — LACTIC ACID, PLASMA: Lactic Acid, Venous: 1.6 mmol/L (ref 0.5–1.9)

## 2019-04-19 LAB — URINALYSIS, COMPLETE (UACMP) WITH MICROSCOPIC
Bacteria, UA: NONE SEEN
Bilirubin Urine: NEGATIVE
Glucose, UA: NEGATIVE mg/dL
Ketones, ur: 20 mg/dL — AB
Leukocytes,Ua: NEGATIVE
Nitrite: NEGATIVE
Protein, ur: NEGATIVE mg/dL
Specific Gravity, Urine: 1.011 (ref 1.005–1.030)
pH: 7 (ref 5.0–8.0)

## 2019-04-19 LAB — URINE DRUG SCREEN, QUALITATIVE (ARMC ONLY)
Amphetamines, Ur Screen: NOT DETECTED
Barbiturates, Ur Screen: NOT DETECTED
Benzodiazepine, Ur Scrn: NOT DETECTED
Cannabinoid 50 Ng, Ur ~~LOC~~: POSITIVE — AB
Cocaine Metabolite,Ur ~~LOC~~: NOT DETECTED
MDMA (Ecstasy)Ur Screen: NOT DETECTED
Methadone Scn, Ur: NOT DETECTED
Opiate, Ur Screen: NOT DETECTED
Phencyclidine (PCP) Ur S: NOT DETECTED
Tricyclic, Ur Screen: NOT DETECTED

## 2019-04-19 LAB — TROPONIN I (HIGH SENSITIVITY)
Troponin I (High Sensitivity): 18 ng/L — ABNORMAL HIGH (ref <18)
Troponin I (High Sensitivity): 22 ng/L — ABNORMAL HIGH (ref <18)

## 2019-04-19 LAB — LIPASE, BLOOD: Lipase: 21 U/L (ref 11–51)

## 2019-04-19 LAB — ETHANOL: Alcohol, Ethyl (B): <10 mg/dL (ref <10)

## 2019-04-19 LAB — PROCALCITONIN: Procalcitonin: <0.1 ng/mL

## 2019-04-19 MED ORDER — LABETALOL HCL 5 MG/ML IV SOLN
20.0000 mg | Freq: Once | INTRAVENOUS | Status: AC
  Administered 2019-04-19: 20 mg via INTRAVENOUS
  Filled 2019-04-19: qty 4

## 2019-04-19 MED ORDER — ATENOLOL 50 MG PO TABS: 50.0000 mg | ORAL_TABLET | Freq: Every day | ORAL | 0 refills | Status: DC

## 2019-04-19 MED ORDER — SODIUM CHLORIDE 0.9 % IV BOLUS
1000.0000 mL | Freq: Once | INTRAVENOUS | Status: AC
  Administered 2019-04-19: 1000 mL via INTRAVENOUS

## 2019-04-19 MED ORDER — ONDANSETRON 4 MG PO TBDP: 4.0000 mg | ORAL_TABLET | Freq: Three times a day (TID) | ORAL | 0 refills | Status: DC | PRN

## 2019-04-19 MED ORDER — HYDRALAZINE HCL 20 MG/ML IJ SOLN
10.0000 mg | Freq: Once | INTRAMUSCULAR | Status: AC
  Administered 2019-04-19: 10 mg via INTRAVENOUS
  Filled 2019-04-19: qty 1

## 2019-04-19 MED ORDER — DROPERIDOL 2.5 MG/ML IJ SOLN
2.5000 mg | Freq: Once | INTRAMUSCULAR | Status: AC
  Administered 2019-04-19: 2.5 mg via INTRAVENOUS

## 2019-04-19 NOTE — Discharge Instructions (Addendum)
1.  Start Atenolol 50 mg daily for your blood pressure. 2.  You may take Zofran as needed for nausea/vomiting. 3. Return to the ER for worsening symptoms, persistent vomiting, difficulty breathing or other concerns.

## 2019-04-19 NOTE — ED Provider Notes (Addendum)
[facility] Emergency Department Provider Note   ____________________________________________   First MD Initiated Contact with Patient 04/19/19 0140     (approximate)  I have reviewed the triage vital signs and the nursing notes.   HISTORY  Chief Complaint Weakness    HPI Christopher Harrell is a 48 y.o. male who presents to the ED from home with multiple medical complaints.  Patient has a history of hypertension and is supposed to take 20 mg lisinopril daily.  Patient reports he gets migraine headaches on the lisinopril so he does not take it.  He was in the ED last night but left without being seen.  Complains of headache, nausea/vomiting, dizziness, generalized weakness and feeling dehydrated.  [facility] so he has not been able to get an appointment to discuss a change in his antihypertensives.  Denies recent fever, cough, chest pain, shortness of breath, abdominal pain.  Endorses smoking.  Denies alcohol or illicit drug use.       Past Medical History:  Diagnosis Date  . Community acquired pneumonia    a. Dx 01/21/2016 - [facility] PNA.  . Essential hypertension   . Hyperlipidemia   . Tobacco abuse     Patient Active Problem List   Diagnosis Date Noted  . Hypertriglyceridemia 06/05/2017  . COPD with acute exacerbation ([place]) 10/14/2016  . Pneumonia 10/14/2016  . Hypertensive urgency, malignant 06/13/2016  . Tobacco abuse 06/13/2016  . Community acquired pneumonia 01/23/2016  . Elevated troponin 01/23/2016  . HTN (hypertension) 01/23/2016  . Chest pain 01/23/2016    Past Surgical History:  Procedure Laterality Date  . NO PAST SURGERIES      Prior to Admission medications   Medication Sig Start Date End Date Taking? Authorizing Provider  albuterol (PROVENTIL HFA;VENTOLIN HFA) 108 (90 Base) MCG/ACT inhaler Inhale 2 puffs into the lungs every 6 (six) hours as needed for wheezing or shortness of breath. Patient not taking:  Reported on 06/13/2018 01/10/18   Erlene Quan, NP  amLODipine (NORVASC) 10 MG tablet Take 1 tablet (10 mg total) by mouth daily. [id number]   Copland, Deirdre Evener, PA-C  aspirin EC 81 MG tablet Take 81 mg by mouth daily.    [provider]  atenolol (TENORMIN) 50 MG tablet Take 1 tablet (50 mg total) by mouth daily. 04/19/19 04/18/20  Paulette Blanch, MD  dicyclomine (BENTYL) 10 MG capsule Take 1 capsule (10 mg total) by mouth 3 (three) times daily as needed for up to 14 days for spasms. 09/10/18 09/24/18  Merlyn Lot, MD  lisinopril (ZESTRIL) 20 MG tablet Take 1 tablet (20 mg total) by mouth daily. [id number]   Copland, Deirdre Evener, PA-C  Multiple Vitamin (MULTIVITAMIN WITH MINERALS) TABS tablet Take 1 tablet by mouth daily.    [provider]  naproxen (NAPROSYN) 500 MG tablet Take 1 tablet (500 mg total) by mouth 2 (two) times daily as needed. Take with meals 01/10/18   Tukov-Yual, Arlyss Gandy, NP  ondansetron (ZOFRAN ODT) 4 MG disintegrating tablet Take 1 tablet (4 mg total) by mouth every 8 (eight) hours as needed for nausea or vomiting. 04/19/19   Paulette Blanch, MD  polyethylene glycol (MIRALAX / GLYCOLAX) 17 g packet Take 17 g by mouth daily. Mix one tablespoon with 8oz of your favorite juice or water every day until you are having soft formed stools. Then start taking once daily if you didn't have a stool the day before. Patient not taking: Reported  on 11/14/2018 09/10/18   Merlyn Lot, MD  prochlorperazine (COMPAZINE) 10 MG tablet Take 1 tablet (10 mg total) by mouth every 6 (six) hours as needed for nausea (headache). Patient not taking: Reported on 07/11/2018 06/01/18   Paulette Blanch, MD  promethazine (PHENERGAN) 12.5 MG tablet Take 1 tablet (12.5 mg total) by mouth every 6 (six) hours as needed for nausea or vomiting. Patient not taking: Reported on 11/14/2018 09/10/18   Merlyn Lot, MD  triamterene-hydrochlorothiazide (DYAZIDE) 37.5-25 MG capsule Take 1 each (1 capsule  total) by mouth daily. [id number]   Copland, Deirdre Evener, PA-C    Allergies Patient has no known allergies.  Family History  Problem Relation Age of Onset  . Hypertension Mother   . Hypertension Father   . Heart attack Maternal Grandmother     Social History Social History   Tobacco Use  . Smoking status: Current Every Day Smoker    Packs/day: 0.50    Years: 20.00    Pack years: 10.00    Types: Cigarettes  . Smokeless tobacco: Never Used  Substance Use Topics  . Alcohol use: Not Currently  . Drug use: No    Review of Systems  Constitutional: No fever/chills Eyes: No visual changes. ENT: No sore throat. Cardiovascular: Denies chest pain. Respiratory: Denies shortness of breath. Gastrointestinal: No abdominal pain.  No nausea, no vomiting.  No diarrhea.  No constipation. Genitourinary: Negative for dysuria. Musculoskeletal: Negative for back pain. Skin: Negative for rash. Neurological: Positive for headache. Negative for focal weakness or numbness.   ____________________________________________   PHYSICAL EXAM:  VITAL SIGNS: ED Triage Vitals  Enc Vitals Group     BP 04/18/19 2132 (!) 191/123     Pulse Rate 04/18/19 2132 77     Resp 04/18/19 2132 20     Temp 04/18/19 2132 98.1 F (36.7 C)     Temp src --      SpO2 04/18/19 2132 (!) 2 %     Weight 04/18/19 2133 212 lb (96.2 kg)     Height 04/18/19 2133 5\' 8"  (1.727 m)     Head Circumference --      Peak Flow --      Pain Score 04/18/19 2133 9     Pain Loc --      Pain Edu? --      Excl. in [place]? --     Constitutional: Alert and oriented. Well appearing and in no acute distress. Eyes: Conjunctivae are normal. PERRL. EOMI. Head: Atraumatic. Nose: No congestion/rhinnorhea. Mouth/Throat: Mucous membranes are moist.  Oropharynx non-erythematous. Neck: No stridor.  No carotid bruits.  Supple neck without meningismus. Cardiovascular: Normal rate, regular rhythm. Grossly normal heart sounds.  Good peripheral  circulation. Respiratory: Normal respiratory effort.  No retractions. Lungs CTAB. Gastrointestinal: Soft and nontender to light or deep palpation. No distention. No abdominal bruits. No CVA tenderness. Musculoskeletal: No lower extremity tenderness nor edema.  No joint effusions. Neurologic: Alert and oriented x3.  CN II-XII grossly intact.  Normal speech and language. No gross focal neurologic deficits are appreciated. No gait instability. Skin:  Skin is warm, dry and intact. No rash noted. Psychiatric: Mood and affect are normal. Speech and behavior are normal.  ____________________________________________   LABS (all labs ordered are listed, but only abnormal results are displayed)  Labs Reviewed  CBC - Abnormal; Notable for the following components:      Result Value   Hemoglobin 17.8 (*)    All other components within normal limits  COMPREHENSIVE METABOLIC PANEL - Abnormal; Notable for the following components:   Glucose, Bld 138 (*)    Total Protein 8.4 (*)    Total Bilirubin 1.9 (*)    All other components within normal limits  URINALYSIS, COMPLETE (UACMP) WITH MICROSCOPIC - Abnormal; Notable for the following components:   Color, Urine YELLOW (*)    APPearance CLEAR (*)    Hgb urine dipstick SMALL (*)    Ketones, ur 20 (*)    All other components within normal limits  URINE DRUG SCREEN, QUALITATIVE ([facility] ONLY) - Abnormal; Notable for the following components:   Cannabinoid 50 Ng, Ur [place] POSITIVE (*)    All other components within normal limits  TROPONIN I (HIGH SENSITIVITY) - Abnormal; Notable for the following components:   Troponin I (High Sensitivity) 18 (*)    All other components within normal limits  TROPONIN I (HIGH SENSITIVITY) - Abnormal; Notable for the following components:   Troponin I (High Sensitivity) 22 (*)    All other components within normal limits  CULTURE, BLOOD (ROUTINE X 2)  CULTURE, BLOOD (ROUTINE X 2)  DIFFERENTIAL  PROTIME-INR  APTT  LIPASE,  BLOOD  ETHANOL  LACTIC ACID, PLASMA  PROCALCITONIN   ____________________________________________  EKG  ED ECG REPORT I, Tayna [place] J, the attending physician, personally viewed and interpreted this ECG.   Date: 04/19/2019  EKG Time: 2148  Rate: 66  Rhythm: normal EKG, normal sinus rhythm  Axis: Normal  Intervals:none  ST&T Change: Nonspecific T wave inversion No significant change from 04/17/2019 ____________________________________________  RADIOLOGY  ED MD interpretation: No ICH; bibasilar airspace opacities could reflect atelectasis versus pneumonia  Official radiology report(s): CT HEAD WO CONTRAST  Result Date: 04/18/2019 CLINICAL DATA:  Weakness, dizziness. EXAM: CT HEAD WITHOUT CONTRAST TECHNIQUE: Contiguous axial images were obtained from the base of the skull through the vertex without intravenous contrast. COMPARISON:  04/17/2019 FINDINGS: Brain: No acute intracranial abnormality. Specifically, no hemorrhage, hydrocephalus, mass lesion, acute infarction, or significant intracranial injury. Vascular: No hyperdense vessel or unexpected calcification. Skull: No acute calvarial abnormality. Sinuses/Orbits: Visualized paranasal sinuses and mastoids clear. Orbital soft tissues unremarkable. Other: None IMPRESSION: Negative. Electronically Signed   By: Rolm Baptise M.D.   On: 04/18/2019 22:01   DG Chest Port 1 View  Result Date: 04/19/2019 CLINICAL DATA:  Hypertension EXAM: PORTABLE CHEST 1 VIEW COMPARISON:  April 17, 2019 FINDINGS: The heart size is enlarged. The aorta appears slightly tortuous and likely calcified. There are streaky bibasilar airspace opacities. There is no pneumothorax. No large pleural effusion. No acute osseous abnormality. IMPRESSION: 1. Streaky bibasilar airspace opacities could reflect atelectasis or pneumonia. 2. Cardiomegaly without overt edema. Electronically Signed   By: Constance Holster M.D.   On: 04/19/2019 02:59     ____________________________________________   PROCEDURES  Procedure(s) performed (including Critical Care):  Procedures  NIH Stroke Scale  Interval: Baseline Time: 7:16 AM Person Administering Scale: Janielle [place] J  Administer stroke scale items in the order listed. Record performance in each category after each subscale exam. Do not go back and change scores. Follow directions provided for each exam technique. Scores should reflect what the patient does, not what the clinician thinks the patient can do. The clinician should record answers while administering the exam and work quickly. Except where indicated, the patient should not be coached (i.e., repeated requests to patient to make a special effort).   1a  Level of consciousness: 0=alert; keenly responsive  1b. LOC questions:  0=Performs both tasks correctly  1c. LOC commands: 0=Performs both tasks correctly  2.  Best Gaze: 0=normal  3.  Visual: 0=No visual loss  4. Facial Palsy: 0=Normal symmetric movement  5a.  Motor left arm: 0=No drift, limb holds 90 (or 45) degrees for full 10 seconds  5b.  Motor right arm: 0=No drift, limb holds 90 (or 45) degrees for full 10 seconds  6a. motor left leg: 0=No drift, limb holds 90 (or 45) degrees for full 10 seconds  6b  Motor right leg:  0=No drift, limb holds 90 (or 45) degrees for full 10 seconds  7. Limb Ataxia: 0=Absent  8.  Sensory: 0=Normal; no sensory loss  9. Best Language:  0=No aphasia, normal  10. Dysarthria: 0=Normal  11. Extinction and Inattention: 0=No abnormality  12. Distal motor function: 0=Normal   Total:   0    ____________________________________________   INITIAL IMPRESSION / ASSESSMENT AND PLAN / ED COURSE  As part of my medical decision making, I reviewed the following data within the [place] notes reviewed and incorporated, Labs reviewed, EKG interpreted, Old EKG reviewed, Old chart reviewed, Radiograph reviewed and Notes from  prior ED visits     Christopher Harrell was evaluated in Emergency Department on 04/19/2019 for the symptoms described in the history of present illness. He was evaluated in the context of the global COVID-19 pandemic, which necessitated consideration that the patient might be at risk for infection with the SARS-CoV-2 virus that causes COVID-19. Institutional protocols and algorithms that pertain to the evaluation of patients at risk for COVID-19 are in a state of rapid change based on information released by regulatory bodies including the CDC and federal and state organizations. These policies and algorithms were followed during the patient's care in the ED.    48 year old male who left without treatment yesterday but returns for hypertension, headache, medical noncompliance.  Differential diagnosis includes but is not limited to migraine headache, headache secondary to elevated blood pressure, hypertensive urgency, hypertensive encephalopathy, etc.  Patient has no focal neurological deficits and NIH stroke scale is 0.  Elevated bilirubin most likely secondary to vomiting which is secondary to headache from elevated blood pressure.  Given his prior history of pancreatitis, will also check lipase.  Will check troponin.  Start with IV hydralazine for blood pressure, IV droperidol for headache.  Administer IV hydration and reassess.   Clinical Course as of Apr 19 715  Sat Apr 19, 2019  [id number] No improvement in blood pressure.  Will try 20 mg IV labetalol.  Of note, patient's first troponin from tonight was 11.  Lab entered the date in error.  Patient complains of feeling short of breath.  His room air saturations were 100% but placed on 2 L nasal cannula oxygen for comfort.  Chest x-ray demonstrates bibasilar opacities-atelectasis versus infiltrates.  Will check blood cultures, lactic acid and procalcitonin.  Obtain Covid swab.   [JS]  0502 BP 138/100 after IV labetalol.  Will continue to monitor as we await  results of lactic acid and procalcitonin.   [JS]  [id number] Patient resting in no acute distress.  Blood pressures creeping back up.  I had previously recommended hospitalization for hypertensive urgency.  I again recommended that patient stay in the hospital but he adamantly wants to be discharged home.  We agreed to give him another dose of labetalol while awaiting results of lactic acid and procalcitonin.   [JS]  [id number] BP 170/128.  Patient continues to press a strong  desire to go home.  We discussed risks of persistently elevated blood pressure including heart attack and stroke.  Patient verbalizes these risks but tells me he will take the new blood pressure medicine.  I will prescribe a atenolol 50 mg daily.  We will also prescribe Zofran ODT as needed.  [facility]  Procalcitonin is pending.  If elevated, will discharge home with antibiotic prescription.  Strict return precautions given.  Patient verbalizes understanding and agrees with plan of care.   [JS]  0715 Procalcitonin is less than 0.10.  Patient still desires discharge.  Again, will discharge home with atenolol and Zofran.  Encourage close [facility].  Strict return precautions given.  Patient verbalizes understanding and agrees with plan of care.   [JS]    Clinical Course User Index [JS] Paulette Blanch, MD     ____________________________________________   FINAL CLINICAL IMPRESSION(S) / ED DIAGNOSES  Final diagnoses:  Essential hypertension  Acute nonintractable headache, unspecified headache type  Non-intractable vomiting with nausea, unspecified vomiting type  Dehydration  Marijuana use     ED Discharge Orders         Ordered    atenolol (TENORMIN) 50 MG tablet  Daily     04/19/19 0644    ondansetron (ZOFRAN ODT) 4 MG disintegrating tablet  Every 8 hours PRN     04/19/19 0704           Note:  This document was prepared using Dragon voice recognition software and may  include unintentional dictation errors.   Paulette Blanch, MD 04/19/19 [id number]    Paulette Blanch, MD 04/19/19 [phone]

## 2019-04-19 NOTE — ED Notes (Signed)
Patient c/o increased difficulty of breathing. Patient's respiratory rate even, and unlabored. Patient's oxygen saturation level 96% on RA. MD Beather Arbour informed. Patient placed on 2L [place] per MD. RN will continue to monitor.

## 2019-04-19 NOTE — ED Notes (Signed)
ED Provider at bedside. 

## 2019-04-19 NOTE — ED Notes (Signed)
Patient reports abnormal sensation left arm.

## 2019-04-24 LAB — CULTURE, BLOOD (ROUTINE X 2)
Culture: NO GROWTH
Culture: NO GROWTH

## 2019-04-30 ENCOUNTER — Other Ambulatory Visit: Payer: Self-pay

## 2019-04-30 ENCOUNTER — Ambulatory Visit: Payer: Self-pay | Admitting: Gerontology

## 2019-04-30 ENCOUNTER — Encounter: Payer: Self-pay | Admitting: Gerontology

## 2019-04-30 VITALS — BP 196/133

## 2019-04-30 DIAGNOSIS — I10 Essential (primary) hypertension: Secondary | ICD-10-CM

## 2019-04-30 DIAGNOSIS — F17200 Nicotine dependence, unspecified, uncomplicated: Secondary | ICD-10-CM

## 2019-04-30 DIAGNOSIS — K047 Periapical abscess without sinus: Secondary | ICD-10-CM | POA: Insufficient documentation

## 2019-04-30 MED ORDER — AMLODIPINE BESYLATE 10 MG PO TABS: 10.0000 mg | ORAL_TABLET | Freq: Every day | ORAL | 1 refills | Status: DC

## 2019-04-30 MED ORDER — TRIAMTERENE-HCTZ 37.5-25 MG PO CAPS: 1.0000 | ORAL_CAPSULE | Freq: Every day | ORAL | 1 refills | Status: DC

## 2019-04-30 MED ORDER — AMOXICILLIN-POT CLAVULANATE 875-125 MG PO TABS: 1.0000 | ORAL_TABLET | Freq: Two times a day (BID) | ORAL | 0 refills | Status: DC

## 2019-04-30 MED ORDER — ATENOLOL 50 MG PO TABS: 50.0000 mg | ORAL_TABLET | Freq: Every day | ORAL | 1 refills | Status: DC

## 2019-04-30 NOTE — Progress Notes (Signed)
Established Patient Office Visit  Subjective:  Patient ID: Christopher Harrell, male    DOB: April 11, 1971  Age: 48 y.o. MRN: [id number]  CC:  Chief Complaint  Patient presents with  . Hypertension    HPI Christopher Harrell presents for follow up of hypertension. He went to the ED on 04/19/2019 for weakness, and 50 mg of atenolol daily was prescribed.  He reports that he did not take his atenolol today, and has been out of his 10 mg amlodipine, and Dyazide for many months.  He states that he last checked his blood pressure 2 days ago and it was 208/135.  He states that he stopped taking lisinopril because it gives him migraine headache.  He continues to smoke half pack of cigarette daily and he states that he gets headache after he smokes.  During visit his blood pressure was 196/133, he denies headache, visual changes, dizziness and weakness.  He also reports that his experiencing tooth ache to his lower left molar.  He denies fever and chills.  He states that he is doing well and offers no further complaints.  Past Medical History:  Diagnosis Date  . Community acquired pneumonia    a. Dx 01/21/2016 - [facility] PNA.  . Essential hypertension   . Hyperlipidemia   . Tobacco abuse     Past Surgical History:  Procedure Laterality Date  . NO PAST SURGERIES      Family History  Problem Relation Age of Onset  . Hypertension Mother   . Hypertension Father   . Heart attack Maternal Grandmother     Social History   Socioeconomic History  . Marital status: Legally Separated    Spouse name: Not on file  . Number of children: Not on file  . Years of education: Not on file  . Highest education level: Not on file  Occupational History  . Not on file  Tobacco Use  . Smoking status: Current Every Day Smoker    Packs/day: 0.50    Years: 20.00    Pack years: 10.00    Types: Cigarettes  . Smokeless tobacco: Never Used  Substance and Sexual Activity  . Alcohol use: Not Currently  . Drug use: No  .  Sexual activity: Yes    Birth control/protection: None  Other Topics Concern  . Not on file  Social History Narrative   No longer living with wife. Walking for 20 mins per day   Social Determinants of Health   Financial Resource Strain: Low Risk   . Difficulty of Paying Living Expenses: Not hard at all  Food Insecurity: No Food Insecurity  . Worried About Charity fundraiser in the Last Year: Never true  . Ran Out of Food in the Last Year: Never true  Transportation Needs:   . Lack of Transportation (Medical): Not on file  . Lack of Transportation (Non-Medical): Not on file  Physical Activity: Insufficiently Active  . Days of Exercise per Week: 5 days  . Minutes of Exercise per Session: 20 min  Stress: No Stress Concern Present  . Feeling of Stress : Not at all  Social Connections: Slightly Isolated  . Frequency of Communication with Friends and Family: More than three times a week  . Frequency of Social Gatherings with Friends and Family: More than three times a week  . Attends Religious Services: More than 4 times per year  . Active Member of Clubs or Organizations: Yes  . Attends Archivist Meetings: 1 to 4  times per year  . Marital Status: Separated  Intimate Partner Violence: Not At Risk  . Fear of Current or Ex-Partner: No  . Emotionally Abused: No  . Physically Abused: No  . Sexually Abused: No    Outpatient Medications Prior to Visit  Medication Sig Dispense Refill  . aspirin EC 81 MG tablet Take 81 mg by mouth daily.    . Multiple Vitamin (MULTIVITAMIN WITH MINERALS) TABS tablet Take 1 tablet by mouth daily.    Marland Kitchen atenolol (TENORMIN) 50 MG tablet Take 1 tablet (50 mg total) by mouth daily. 30 tablet 0  . naproxen (NAPROSYN) 500 MG tablet Take 1 tablet (500 mg total) by mouth 2 (two) times daily as needed. Take with meals 30 tablet 1  . ondansetron (ZOFRAN ODT) 4 MG disintegrating tablet Take 1 tablet (4 mg total) by mouth every 8 (eight) hours as needed for  nausea or vomiting. 20 tablet 0  . albuterol (PROVENTIL HFA;VENTOLIN HFA) 108 (90 Base) MCG/ACT inhaler Inhale 2 puffs into the lungs every 6 (six) hours as needed for wheezing or shortness of breath. (Patient not taking: Reported on 06/13/2018) 1 Inhaler 3  . amLODipine (NORVASC) 10 MG tablet Take 1 tablet (10 mg total) by mouth daily. (Patient not taking: Reported on 04/30/2019) 30 tablet 0  . dicyclomine (BENTYL) 10 MG capsule Take 1 capsule (10 mg total) by mouth 3 (three) times daily as needed for up to 14 days for spasms. 16 capsule 0  . lisinopril (ZESTRIL) 20 MG tablet Take 1 tablet (20 mg total) by mouth daily. (Patient not taking: Reported on 04/30/2019) 30 tablet 0  . polyethylene glycol (MIRALAX / GLYCOLAX) 17 g packet Take 17 g by mouth daily. Mix one tablespoon with 8oz of your favorite juice or water every day until you are having soft formed stools. Then start taking once daily if you didn't have a stool the day before. (Patient not taking: Reported on 11/14/2018) 30 each 0  . prochlorperazine (COMPAZINE) 10 MG tablet Take 1 tablet (10 mg total) by mouth every 6 (six) hours as needed for nausea (headache). (Patient not taking: Reported on 07/11/2018) 20 tablet 0  . promethazine (PHENERGAN) 12.5 MG tablet Take 1 tablet (12.5 mg total) by mouth every 6 (six) hours as needed for nausea or vomiting. (Patient not taking: Reported on 11/14/2018) 12 tablet 0  . triamterene-hydrochlorothiazide (DYAZIDE) 37.5-25 MG capsule Take 1 each (1 capsule total) by mouth daily. (Patient not taking: Reported on 04/30/2019) 30 capsule 0   No facility-administered medications prior to visit.    No Known Allergies  ROS Review of Systems  Constitutional: Negative.   HENT: Positive for dental problem.   Eyes: Negative.  Negative for visual disturbance.  Respiratory: Negative.   Cardiovascular: Negative.   Skin: Negative.   Neurological: Negative.   Psychiatric/Behavioral: Negative.       Objective:     Physical Exam  Constitutional: He is oriented to person, place, and time. He appears well-developed.  HENT:  Head: Normocephalic and atraumatic.  Mouth/Throat: Dental abscesses and dental caries present.    Eyes: Pupils are equal, round, and reactive to light. EOM are normal.  Cardiovascular: Normal rate and regular rhythm.  Pulmonary/Chest: Effort normal and breath sounds normal.  Neurological: He is alert and oriented to person, place, and time. He has normal strength. No cranial nerve deficit or sensory deficit. GCS eye subscore is 4. GCS verbal subscore is 5. GCS motor subscore is 6.  Skin: Skin is  warm and dry.  Psychiatric: He has a normal mood and affect. His behavior is normal. Judgment and thought content normal.    BP (!) 196/133 (BP Location: Right Arm, Patient Position: Sitting)  Wt Readings from Last 3 Encounters:  04/18/19 212 lb (96.2 kg)  04/17/19 213 lb (96.6 kg)  11/14/18 213 lb (96.6 kg)     Health Maintenance Due  Topic Date Due  . TETANUS/TDAP  03/24/1988  . INFLUENZA VACCINE  11/30/2018  . COLONOSCOPY  03/25/2019    There are no preventive care reminders to display for this patient.  Lab Results  Component Value Date   TSH 1.110 06/13/2016   Lab Results  Component Value Date   WBC 7.8 04/18/2019   HGB 17.8 (H) 04/18/2019   HCT 50.0 04/18/2019   MCV 86.5 04/18/2019   PLT 254 04/18/2019   Lab Results  Component Value Date   NA 139 04/18/2019   K 4.0 04/18/2019   CO2 27 04/18/2019   GLUCOSE 138 (H) 04/18/2019   BUN 15 04/18/2019   CREATININE 0.94 04/18/2019   BILITOT 1.9 (H) 04/18/2019   ALKPHOS 61 04/18/2019   AST 26 04/18/2019   ALT 39 04/18/2019   PROT 8.4 (H) 04/18/2019   ALBUMIN 5.0 04/18/2019   CALCIUM 9.8 04/18/2019   ANIONGAP 12 04/18/2019   Lab Results  Component Value Date   CHOL 271 (H) 06/13/2018   Lab Results  Component Value Date   HDL 44 06/13/2018   Lab Results  Component Value Date   LDLCALC Comment  06/13/2018   Lab Results  Component Value Date   TRIG 466 (H) 06/13/2018   Lab Results  Component Value Date   CHOLHDL 6.2 (H) 06/13/2018   Lab Results  Component Value Date   HGBA1C 5.4 06/13/2018      Assessment & Plan:   1. Essential hypertension -Uncontrolled blood pressure due to noncompliant with treatment regimen.  He declined going to the emergency room.  He was advised to pick up medication from pharmacy, and to take medication as prescribed.  He was advised to check his blood pressure daily, record and call the clinic tomorrow with blood pressure readings.  He was advised to continue on DASH diet, and exercise as tolerated.  He was also educated on the complications of uncontrolled blood pressure, and he verbalized understanding.  His goal blood pressure is less than 140/90 and normal blood pressure is less than 120/80.  He was advised to go to the emergency room with worsening symptoms. - amLODipine (NORVASC) 10 MG tablet; Take 1 tablet (10 mg total) by mouth daily.  Dispense: 30 tablet; Refill: 1 - atenolol (TENORMIN) 50 MG tablet; Take 1 tablet (50 mg total) by mouth daily.  Dispense: 30 tablet; Refill: 1 - triamterene-hydrochlorothiazide (DYAZIDE) 37.5-25 MG capsule; Take 1 each (1 capsule total) by mouth daily.  Dispense: 30 capsule; Refill: 1  2. Tooth abscess -He has erythema to lower left molar, and he will continue on Augmentin.  He was educated on medication side effect and advised to notify clinic.  He completed dental application and will follow up when his blood pressure is controlled. - amoxicillin-clavulanate (AUGMENTIN) 875-125 MG tablet; Take 1 tablet by mouth 2 (two) times daily.  Dispense: 10 tablet; Refill: 0  3. Smoking -He was strongly encouraged on smoking cessation and was provided with [place] quit line information.     Follow-up: Return in about 1 week (around 05/07/2019), or if symptoms worsen or fail to  improve.    Blanka Rockholt Jerold Coombe, NP

## 2019-04-30 NOTE — Patient Instructions (Signed)
Smoking Tobacco Information, Adult Smoking tobacco can be harmful to your health. Tobacco contains a poisonous (toxic), colorless chemical called nicotine. Nicotine is addictive. It changes the brain and can make it hard to stop smoking. Tobacco also has other toxic chemicals that can hurt your body and raise your risk of many cancers. How can smoking tobacco affect me? Smoking tobacco puts you at risk for:  Cancer. Smoking is most commonly associated with lung cancer, but can also lead to cancer in other parts of the body.  Chronic obstructive pulmonary disease (COPD). This is a long-term lung condition that makes it hard to breathe. It also gets worse over time.  High blood pressure (hypertension), heart disease, stroke, or heart attack.  Lung infections, such as pneumonia.  Cataracts. This is when the lenses in the eyes become clouded.  Digestive problems. This may include peptic ulcers, heartburn, and gastroesophageal reflux disease (GERD).  Oral health problems, such as gum disease and tooth loss.  Loss of taste and smell. Smoking can affect your appearance by causing:  Wrinkles.  Yellow or stained teeth, fingers, and fingernails. Smoking tobacco can also affect your social life, because:  It may be challenging to find places to smoke when away from home. Many workplaces, restaurants, hotels, and public places are tobacco-free.  Smoking is expensive. This is due to the cost of tobacco and the long-term costs of treating health problems from smoking.  Secondhand smoke may affect those around you. Secondhand smoke can cause lung cancer, breathing problems, and heart disease. Children of smokers have a higher risk for: ? Sudden infant death syndrome (SIDS). ? Ear infections. ? Lung infections. If you currently smoke tobacco, quitting now can help you:  Lead a longer and healthier life.  Look, smell, breathe, and feel better over time.  Save money.  Protect others from the  harms of secondhand smoke. What actions can I take to prevent health problems? Quit smoking   Do not start smoking. Quit if you already do.  Make a plan to quit smoking and commit to it. Look for programs to help you and ask your health care provider for recommendations and ideas.  Set a date and write down all the reasons you want to quit.  Let your friends and family know you are quitting so they can help and support you. Consider finding friends who also want to quit. It can be easier to quit with someone else, so that you can support each other.  Talk with your health care provider about using nicotine replacement medicines to help you quit, such as gum, lozenges, patches, sprays, or pills.  Do not replace cigarette smoking with electronic cigarettes, which are commonly called e-cigarettes. The safety of e-cigarettes is not known, and some may contain harmful chemicals.  If you try to quit but return to smoking, stay positive. It is common to slip up when you first quit, so take it one day at a time.  Be prepared for cravings. When you feel the urge to smoke, chew gum or suck on hard candy. Lifestyle  Stay busy and take care of your body.  Drink enough fluid to keep your urine pale yellow.  Get plenty of exercise and eat a healthy diet. This can help prevent weight gain after quitting.  Monitor your eating habits. Quitting smoking can cause you to have a larger appetite than when you smoke.  Find ways to relax. Go out with friends or family to a movie or a restaurant   where people do not smoke.  Ask your health care provider about having regular tests (screenings) to check for cancer. This may include blood tests, imaging tests, and other tests.  Find ways to manage your stress, such as meditation, yoga, or exercise. Where to find support To get support to quit smoking, consider:  Asking your health care provider for more information and resources.  Taking classes to learn  more about quitting smoking.  Looking for local organizations that offer resources about quitting smoking.  Joining a support group for people who want to quit smoking in your local community.  Calling the smokefree.gov counselor helpline: 1-800-Quit-Now 7034887102) Where to find more information You may find more information about quitting smoking from:  HelpGuide.org: www.helpguide.org  https://hall.com/: smokefree.gov  American Lung Association: www.lung.org Contact a health care provider if you:  Have problems breathing.  Notice that your lips, nose, or fingers turn blue.  Have chest pain.  Are coughing up blood.  Feel faint or you pass out.  Have other health changes that cause you to worry. Summary  Smoking tobacco can negatively affect your health, the health of those around you, your finances, and your social life.  Do not start smoking. Quit if you already do. If you need help quitting, ask your health care provider.  Think about joining a support group for people who want to quit smoking in your local community. There are many effective programs that will help you to quit this behavior. This information is not intended to replace advice given to you by your health care provider. Make sure you discuss any questions you have with your health care provider. Document Released: 05/02/2016 Document Revised: 06/06/2017 Document Reviewed: 05/02/2016 Elsevier Patient Education  2020 [place] DASH stands for "Dietary Approaches to Stop Hypertension." The DASH eating plan is a healthy eating plan that has been shown to reduce high blood pressure (hypertension). It may also reduce your risk for type 2 diabetes, heart disease, and stroke. The DASH eating plan may also help with weight loss. What are tips for following this plan?  General guidelines  Avoid eating more than 2,300 mg (milligrams) of salt (sodium) a day. If you have hypertension, you may  need to reduce your sodium intake to 1,500 mg a day.  Limit alcohol intake to no more than 1 drink a day for nonpregnant women and 2 drinks a day for men. One drink equals 12 oz of beer, 5 oz of wine, or 1 oz of hard liquor.  Work with your health care provider to maintain a healthy body weight or to lose weight. Ask what an ideal weight is for you.  Get at least 30 minutes of exercise that causes your heart to beat faster (aerobic exercise) most days of the week. Activities may include walking, swimming, or biking.  Work with your health care provider or diet and nutrition specialist (dietitian) to adjust your eating plan to your individual calorie needs. Reading food labels   Check food labels for the amount of sodium per serving. Choose foods with less than 5 percent of the Daily Value of sodium. Generally, foods with less than 300 mg of sodium per serving fit into this eating plan.  To find whole grains, look for the word "whole" as the first word in the ingredient list. Shopping  Buy products labeled as "low-sodium" or "no salt added."  Buy fresh foods. Avoid canned foods and premade or frozen meals. Cooking  Avoid adding salt  when cooking. Use salt-free seasonings or herbs instead of table salt or sea salt. Check with your health care provider or pharmacist before using salt substitutes.  Do not fry foods. Cook foods using healthy methods such as baking, boiling, grilling, and broiling instead.  Cook with heart-healthy oils, such as olive, canola, soybean, or sunflower oil. Meal planning  Eat a balanced diet that includes: ? 5 or more servings of fruits and vegetables each day. At each meal, try to fill half of your plate with fruits and vegetables. ? Up to 6-8 servings of whole grains each day. ? Less than 6 oz of lean meat, poultry, or fish each day. A 3-oz serving of meat is about the same size as a deck of cards. One egg equals 1 oz. ? 2 servings of low-fat dairy each  day. ? A serving of nuts, seeds, or beans 5 times each week. ? Heart-healthy fats. Healthy fats called Omega-3 fatty acids are found in foods such as flaxseeds and coldwater fish, like sardines, salmon, and mackerel.  Limit how much you eat of the following: ? Canned or prepackaged foods. ? Food that is high in trans fat, such as fried foods. ? Food that is high in saturated fat, such as fatty meat. ? Sweets, desserts, sugary drinks, and other foods with added sugar. ? Full-fat dairy products.  Do not salt foods before eating.  Try to eat at least 2 vegetarian meals each week.  Eat more home-cooked food and less restaurant, buffet, and fast food.  When eating at a restaurant, ask that your food be prepared with less salt or no salt, if possible. What foods are recommended? The items listed may not be a complete list. Talk with your dietitian about what dietary choices are best for you. Grains Whole-grain or whole-wheat bread. Whole-grain or whole-wheat pasta. Brown rice. [place]. Bulgur. Whole-grain and low-sodium cereals. Pita bread. Low-fat, low-sodium crackers. Whole-wheat flour tortillas. Vegetables Fresh or frozen vegetables (raw, steamed, roasted, or grilled). Low-sodium or reduced-sodium tomato and vegetable juice. Low-sodium or reduced-sodium tomato sauce and tomato paste. Low-sodium or reduced-sodium canned vegetables. Fruits All fresh, dried, or frozen fruit. Canned fruit in natural juice (without added sugar). Meat and other protein foods Skinless chicken or [place]. Ground chicken or [place]. Pork with fat trimmed off. Fish and seafood. Egg whites. Dried beans, peas, or lentils. Unsalted nuts, nut butters, and seeds. Unsalted canned beans. Lean cuts of beef with fat trimmed off. Low-sodium, lean deli meat. Dairy Low-fat (1%) or fat-free (skim) milk. Fat-free, low-fat, or reduced-fat cheeses. Nonfat, low-sodium ricotta or cottage cheese. Low-fat or nonfat yogurt.  Low-fat, low-sodium cheese. Fats and oils Soft margarine without trans fats. Vegetable oil. Low-fat, reduced-fat, or light mayonnaise and salad dressings (reduced-sodium). Canola, safflower, olive, soybean, and sunflower oils. Avocado. Seasoning and other foods Herbs. Spices. Seasoning mixes without salt. Unsalted popcorn and pretzels. Fat-free sweets. What foods are not recommended? The items listed may not be a complete list. Talk with your dietitian about what dietary choices are best for you. Grains Baked goods made with fat, such as croissants, muffins, or some breads. Dry pasta or rice meal packs. Vegetables Creamed or fried vegetables. Vegetables in a cheese sauce. Regular canned vegetables (not low-sodium or reduced-sodium). Regular canned tomato sauce and paste (not low-sodium or reduced-sodium). Regular tomato and vegetable juice (not low-sodium or reduced-sodium). Angie Fava. Olives. Fruits Canned fruit in a light or heavy syrup. Fried fruit. Fruit in cream or butter sauce. Meat and other protein  foods Fatty cuts of meat. Ribs. Fried meat. Berniece Salines. Sausage. Bologna and other processed lunch meats. Salami. Fatback. Hotdogs. Bratwurst. Salted nuts and seeds. Canned beans with added salt. Canned or smoked fish. Whole eggs or egg yolks. Chicken or [place] with skin. Dairy Whole or 2% milk, cream, and half-and-half. Whole or full-fat cream cheese. Whole-fat or sweetened yogurt. Full-fat cheese. Nondairy creamers. Whipped toppings. Processed cheese and cheese spreads. Fats and oils Butter. Stick margarine. Lard. Shortening. Ghee. Bacon fat. Tropical oils, such as coconut, palm kernel, or palm oil. Seasoning and other foods Salted popcorn and pretzels. Onion salt, garlic salt, seasoned salt, table salt, and sea salt. Worcestershire sauce. Tartar sauce. Barbecue sauce. Teriyaki sauce. Soy sauce, including reduced-sodium. Steak sauce. Canned and packaged gravies. Fish sauce. Oyster sauce. Cocktail  sauce. Horseradish that you find on the shelf. Ketchup. Mustard. Meat flavorings and tenderizers. Bouillon cubes. Hot sauce and Tabasco sauce. Premade or packaged marinades. Premade or packaged taco seasonings. Relishes. Regular salad dressings. Where to find more information:  National Heart, Lung, and [place]: https://wilson-eaton.com/  American Heart Association: www.heart.org Summary  The DASH eating plan is a healthy eating plan that has been shown to reduce high blood pressure (hypertension). It may also reduce your risk for type 2 diabetes, heart disease, and stroke.  With the DASH eating plan, you should limit salt (sodium) intake to 2,300 mg a day. If you have hypertension, you may need to reduce your sodium intake to 1,500 mg a day.  When on the DASH eating plan, aim to eat more fresh fruits and vegetables, whole grains, lean proteins, low-fat dairy, and heart-healthy fats.  Work with your health care provider or diet and nutrition specialist (dietitian) to adjust your eating plan to your individual calorie needs. This information is not intended to replace advice given to you by your health care provider. Make sure you discuss any questions you have with your health care provider. Document Released: 04/06/2011 Document Revised: 03/30/2017 Document Reviewed: 04/10/2016 Elsevier Patient Education  2020 [employer].

## 2019-05-07 ENCOUNTER — Ambulatory Visit: Payer: Self-pay | Admitting: Gerontology

## 2019-05-07 ENCOUNTER — Encounter: Payer: Self-pay | Admitting: Gerontology

## 2019-05-07 ENCOUNTER — Other Ambulatory Visit: Payer: Self-pay

## 2019-05-07 VITALS — BP 155/108 | HR 85 | Wt 217.0 lb

## 2019-05-07 DIAGNOSIS — I10 Essential (primary) hypertension: Secondary | ICD-10-CM

## 2019-05-07 DIAGNOSIS — F17200 Nicotine dependence, unspecified, uncomplicated: Secondary | ICD-10-CM

## 2019-05-07 MED ORDER — TRIAMTERENE-HCTZ 37.5-25 MG PO CAPS: 1.0000 | ORAL_CAPSULE | Freq: Every day | ORAL | 2 refills | Status: DC

## 2019-05-07 MED ORDER — AMLODIPINE BESYLATE 10 MG PO TABS: 10.0000 mg | ORAL_TABLET | Freq: Every day | ORAL | 2 refills | Status: DC

## 2019-05-07 MED ORDER — ATENOLOL 50 MG PO TABS: 50.0000 mg | ORAL_TABLET | Freq: Every day | ORAL | 2 refills | Status: DC

## 2019-05-07 NOTE — Progress Notes (Signed)
Established Patient Office Visit  Subjective:  Patient ID: Christopher Harrell, male    DOB: 1970-11-11  Age: 49 y.o. MRN: [id number]  CC:  Chief Complaint  Patient presents with  . Hypertension    HPI Christopher Harrell presents for follow up of hypertension. His blood pressure during visit was 155/108, he states that he forgot to bring his blood pressure log to his appointment, but states that it's usually in the low 150's/low 100's. He states that he is compliant with his medications, working on his diet and exercise. He continues to smoke 1 pack of cigarette daily and admits the desire to quit. He denies chest pain, palpitation, headache, blurry vision, light headedness and peripheral edema. He states that he's doing well and offers no further complaint.  Past Medical History:  Diagnosis Date  . Community acquired pneumonia    a. Dx 01/21/2016 - [facility].  . Essential hypertension   . Hyperlipidemia   . Tobacco abuse     Past Surgical History:  Procedure Laterality Date  . NO PAST SURGERIES      Family History  Problem Relation Age of Onset  . Hypertension Mother   . Hypertension Father   . Heart attack Maternal Grandmother     Social History   Socioeconomic History  . Marital status: Legally Separated    Spouse name: Not on file  . Number of children: Not on file  . Years of education: Not on file  . Highest education level: Not on file  Occupational History  . Not on file  Tobacco Use  . Smoking status: Current Every Day Smoker    Packs/day: 0.50    Years: 20.00    Pack years: 10.00    Types: Cigarettes  . Smokeless tobacco: Never Used  Substance and Sexual Activity  . Alcohol use: Not Currently  . Drug use: No  . Sexual activity: Yes    Birth control/protection: None  Other Topics Concern  . Not on file  Social History Narrative   No longer living with wife. Walking for 20 mins per day   Social Determinants of Health   Financial Resource Strain: Low Risk    . Difficulty of Paying Living Expenses: Not hard at all  Food Insecurity: No Food Insecurity  . Worried About Charity fundraiser in the Last Year: Never true  . Ran Out of Food in the Last Year: Never true  Transportation Needs:   . Lack of Transportation (Medical): Not on file  . Lack of Transportation (Non-Medical): Not on file  Physical Activity: Insufficiently Active  . Days of Exercise per Week: 5 days  . Minutes of Exercise per Session: 20 min  Stress: No Stress Concern Present  . Feeling of Stress : Not at all  Social Connections: Slightly Isolated  . Frequency of Communication with Friends and Family: More than three times a week  . Frequency of Social Gatherings with Friends and Family: More than three times a week  . Attends Religious Services: More than 4 times per year  . Active Member of Clubs or Organizations: Yes  . Attends Archivist Meetings: 1 to 4 times per year  . Marital Status: Separated  Intimate Partner Violence: Not At Risk  . Fear of Current or Ex-Partner: No  . Emotionally Abused: No  . Physically Abused: No  . Sexually Abused: No    Outpatient Medications Prior to Visit  Medication Sig Dispense Refill  . albuterol (PROVENTIL HFA;VENTOLIN HFA)  108 (90 Base) MCG/ACT inhaler Inhale 2 puffs into the lungs every 6 (six) hours as needed for wheezing or shortness of breath. (Patient not taking: Reported on 06/13/2018) 1 Inhaler 3  . aspirin EC 81 MG tablet Take 81 mg by mouth daily.    . Multiple Vitamin (MULTIVITAMIN WITH MINERALS) TABS tablet Take 1 tablet by mouth daily.    Marland Kitchen amLODipine (NORVASC) 10 MG tablet Take 1 tablet (10 mg total) by mouth daily. 30 tablet 1  . amoxicillin-clavulanate (AUGMENTIN) 875-125 MG tablet Take 1 tablet by mouth 2 (two) times daily. 10 tablet 0  . atenolol (TENORMIN) 50 MG tablet Take 1 tablet (50 mg total) by mouth daily. 30 tablet 1  . triamterene-hydrochlorothiazide (DYAZIDE) 37.5-25 MG capsule Take 1 each (1  capsule total) by mouth daily. 30 capsule 1   No facility-administered medications prior to visit.    No Known Allergies  ROS Review of Systems  Constitutional: Negative.   Eyes: Negative.   Respiratory: Negative.   Cardiovascular: Negative.   Neurological: Negative.   Psychiatric/Behavioral: Negative.       Objective:    Physical Exam  Constitutional: He is oriented to person, place, and time. He appears well-developed.  HENT:  Head: Normocephalic and atraumatic.  Eyes: Pupils are equal, round, and reactive to light. EOM are normal.  Cardiovascular: Normal rate and regular rhythm.  Pulmonary/Chest: Effort normal and breath sounds normal.  Neurological: He is alert and oriented to person, place, and time.  Psychiatric: He has a normal mood and affect. His behavior is normal. Judgment and thought content normal.    BP (!) 155/108 (BP Location: Right Arm, Patient Position: Sitting)   Pulse 85   Wt 217 lb (98.4 kg)   SpO2 98%   BMI 32.99 kg/m  Wt Readings from Last 3 Encounters:  05/07/19 217 lb (98.4 kg)  04/18/19 212 lb (96.2 kg)  04/17/19 213 lb (96.6 kg)   He gained 5 pounds in 3 weeks and was encouraged to continue on his weight loss regimen.  Health Maintenance Due  Topic Date Due  . TETANUS/TDAP  03/24/1988  . INFLUENZA VACCINE  11/30/2018  . COLONOSCOPY  03/25/2019    There are no preventive care reminders to display for this patient.  Lab Results  Component Value Date   TSH 1.110 06/13/2016   Lab Results  Component Value Date   WBC 7.8 04/18/2019   HGB 17.8 (H) 04/18/2019   HCT 50.0 04/18/2019   MCV 86.5 04/18/2019   PLT 254 04/18/2019   Lab Results  Component Value Date   NA 139 04/18/2019   K 4.0 04/18/2019   CO2 27 04/18/2019   GLUCOSE 138 (H) 04/18/2019   BUN 15 04/18/2019   CREATININE 0.94 04/18/2019   BILITOT 1.9 (H) 04/18/2019   ALKPHOS 61 04/18/2019   AST 26 04/18/2019   ALT 39 04/18/2019   PROT 8.4 (H) 04/18/2019   ALBUMIN  5.0 04/18/2019   CALCIUM 9.8 04/18/2019   ANIONGAP 12 04/18/2019   Lab Results  Component Value Date   CHOL 271 (H) 06/13/2018   Lab Results  Component Value Date   HDL 44 06/13/2018   Lab Results  Component Value Date   LDLCALC Comment 06/13/2018   Lab Results  Component Value Date   TRIG 466 (H) 06/13/2018   Lab Results  Component Value Date   CHOLHDL 6.2 (H) 06/13/2018   Lab Results  Component Value Date   HGBA1C 5.4 06/13/2018  Assessment & Plan:    1. Essential hypertension -He was commended for complying with his medication. His blood pressure has decreased from 190/138 to 155/108 in one week. He will continue on current treatment regimen, was advised to continue on DASH diet, and exercise as tolerated. He was advised to check blood pressure daily, record and bring log to follow up appointment. He was also advised to his goal blood pressure should be less than 140/90 and normal is less than 120/80. - amLODipine (NORVASC) 10 MG tablet; Take 1 tablet (10 mg total) by mouth daily.  Dispense: 30 tablet; Refill: 2 - atenolol (TENORMIN) 50 MG tablet; Take 1 tablet (50 mg total) by mouth daily.  Dispense: 30 tablet; Refill: 2 - triamterene-hydrochlorothiazide (DYAZIDE) 37.5-25 MG capsule; Take 1 each (1 capsule total) by mouth daily.  Dispense: 30 capsule; Refill: 2  2. Smoking - He was strongly encouraged on smoking cessation and was provided with the [place] Quit line phone number.    Follow-up: Return in about 4 weeks (around 06/04/2019), or if symptoms worsen or fail to improve.    Danaisha Celli Jerold Coombe, NP

## 2019-05-07 NOTE — Patient Instructions (Signed)
Smoking Tobacco Information, Adult Smoking tobacco can be harmful to your health. Tobacco contains a poisonous (toxic), colorless chemical called nicotine. Nicotine is addictive. It changes the brain and can make it hard to stop smoking. Tobacco also has other toxic chemicals that can hurt your body and raise your risk of many cancers. How can smoking tobacco affect me? Smoking tobacco puts you at risk for:  Cancer. Smoking is most commonly associated with lung cancer, but can also lead to cancer in other parts of the body.  Chronic obstructive pulmonary disease (COPD). This is a long-term lung condition that makes it hard to breathe. It also gets worse over time.  High blood pressure (hypertension), heart disease, stroke, or heart attack.  Lung infections, such as pneumonia.  Cataracts. This is when the lenses in the eyes become clouded.  Digestive problems. This may include peptic ulcers, heartburn, and gastroesophageal reflux disease (GERD).  Oral health problems, such as gum disease and tooth loss.  Loss of taste and smell. Smoking can affect your appearance by causing:  Wrinkles.  Yellow or stained teeth, fingers, and fingernails. Smoking tobacco can also affect your social life, because:  It may be challenging to find places to smoke when away from home. Many workplaces, restaurants, hotels, and public places are tobacco-free.  Smoking is expensive. This is due to the cost of tobacco and the long-term costs of treating health problems from smoking.  Secondhand smoke may affect those around you. Secondhand smoke can cause lung cancer, breathing problems, and heart disease. Children of smokers have a higher risk for: ? Sudden infant death syndrome (SIDS). ? Ear infections. ? Lung infections. If you currently smoke tobacco, quitting now can help you:  Lead a longer and healthier life.  Look, smell, breathe, and feel better over time.  Save money.  Protect others from the  harms of secondhand smoke. What actions can I take to prevent health problems? Quit smoking   Do not start smoking. Quit if you already do.  Make a plan to quit smoking and commit to it. Look for programs to help you and ask your health care provider for recommendations and ideas.  Set a date and write down all the reasons you want to quit.  Let your friends and family know you are quitting so they can help and support you. Consider finding friends who also want to quit. It can be easier to quit with someone else, so that you can support each other.  Talk with your health care provider about using nicotine replacement medicines to help you quit, such as gum, lozenges, patches, sprays, or pills.  Do not replace cigarette smoking with electronic cigarettes, which are commonly called e-cigarettes. The safety of e-cigarettes is not known, and some may contain harmful chemicals.  If you try to quit but return to smoking, stay positive. It is common to slip up when you first quit, so take it one day at a time.  Be prepared for cravings. When you feel the urge to smoke, chew gum or suck on hard candy. Lifestyle  Stay busy and take care of your body.  Drink enough fluid to keep your urine pale yellow.  Get plenty of exercise and eat a healthy diet. This can help prevent weight gain after quitting.  Monitor your eating habits. Quitting smoking can cause you to have a larger appetite than when you smoke.  Find ways to relax. Go out with friends or family to a movie or a restaurant   where people do not smoke. °· Ask your health care provider about having regular tests (screenings) to check for cancer. This may include blood tests, imaging tests, and other tests. °· Find ways to manage your stress, such as meditation, yoga, or exercise. °Where to find support °To get support to quit smoking, consider: °· Asking your health care provider for more information and resources. °· Taking classes to learn  more about quitting smoking. °· Looking for local organizations that offer resources about quitting smoking. °· Joining a support group for people who want to quit smoking in your local community. °· Calling the smokefree.gov counselor helpline: 1-800-Quit-Now (1-800-784-8669) °Where to find more information °You may find more information about quitting smoking from: °· HelpGuide.org: www.helpguide.org °· Smokefree.gov: smokefree.gov °· American Lung Association: www.lung.org °Contact a health care provider if you: °· Have problems breathing. °· Notice that your lips, nose, or fingers turn blue. °· Have chest pain. °· Are coughing up blood. °· Feel faint or you pass out. °· Have other health changes that cause you to worry. °Summary °· Smoking tobacco can negatively affect your health, the health of those around you, your finances, and your social life. °· Do not start smoking. Quit if you already do. If you need help quitting, ask your health care provider. °· Think about joining a support group for people who want to quit smoking in your local community. There are many effective programs that will help you to quit this behavior. °This information is not intended to replace advice given to you by your health care provider. Make sure you discuss any questions you have with your health care provider. °Document Revised: 01/10/2019 Document Reviewed: 05/02/2016 °Elsevier Patient Education © 2020 [employer] °DASH Eating Plan °DASH stands for "Dietary Approaches to Stop Hypertension." The DASH eating plan is a healthy eating plan that has been shown to reduce high blood pressure (hypertension). It may also reduce your risk for type 2 diabetes, heart disease, and stroke. The DASH eating plan may also help with weight loss. °What are tips for following this plan? ° °General guidelines °· Avoid eating more than 2,300 mg (milligrams) of salt (sodium) a day. If you have hypertension, you may need to reduce your sodium  intake to 1,500 mg a day. °· Limit alcohol intake to no more than 1 drink a day for nonpregnant women and 2 drinks a day for men. One drink equals 12 oz of beer, 5 oz of wine, or 1½ oz of hard liquor. °· Work with your health care provider to maintain a healthy body weight or to lose weight. Ask what an ideal weight is for you. °· Get at least 30 minutes of exercise that causes your heart to beat faster (aerobic exercise) most days of the week. Activities may include walking, swimming, or biking. °· Work with your health care provider or diet and nutrition specialist (dietitian) to adjust your eating plan to your individual calorie needs. °Reading food labels ° °· Check food labels for the amount of sodium per serving. Choose foods with less than 5 percent of the Daily Value of sodium. Generally, foods with less than 300 mg of sodium per serving fit into this eating plan. °· To find whole grains, look for the word "whole" as the first word in the ingredient list. °Shopping °· Buy products labeled as "low-sodium" or "no salt added." °· Buy fresh foods. Avoid canned foods and premade or frozen meals. °Cooking °· Avoid adding salt when cooking. Use   salt-free seasonings or herbs instead of table salt or sea salt. Check with your health care provider or pharmacist before using salt substitutes. °· Do not fry foods. Cook foods using healthy methods such as baking, boiling, grilling, and broiling instead. °· Cook with heart-healthy oils, such as olive, canola, soybean, or sunflower oil. °Meal planning °· Eat a balanced diet that includes: °? 5 or more servings of fruits and vegetables each day. At each meal, try to fill half of your plate with fruits and vegetables. °? Up to 6-8 servings of whole grains each day. °? Less than 6 oz of lean meat, poultry, or fish each day. A 3-oz serving of meat is about the same size as a deck of cards. One egg equals 1 oz. °? 2 servings of low-fat dairy each day. °? A serving of nuts,  seeds, or beans 5 times each week. °? Heart-healthy fats. Healthy fats called Omega-3 fatty acids are found in foods such as flaxseeds and coldwater fish, like sardines, salmon, and mackerel. °· Limit how much you eat of the following: °? Canned or prepackaged foods. °? Food that is high in trans fat, such as fried foods. °? Food that is high in saturated fat, such as fatty meat. °? Sweets, desserts, sugary drinks, and other foods with added sugar. °? Full-fat dairy products. °· Do not salt foods before eating. °· Try to eat at least 2 vegetarian meals each week. °· Eat more home-cooked food and less restaurant, buffet, and fast food. °· When eating at a restaurant, ask that your food be prepared with less salt or no salt, if possible. °What foods are recommended? °The items listed may not be a complete list. Talk with your dietitian about what dietary choices are best for you. °Grains °Whole-grain or whole-wheat bread. Whole-grain or whole-wheat pasta. Brown rice. Oatmeal. Quinoa. Bulgur. Whole-grain and low-sodium cereals. Pita bread. Low-fat, low-sodium crackers. Whole-wheat flour tortillas. °Vegetables °Fresh or frozen vegetables (raw, steamed, roasted, or grilled). Low-sodium or reduced-sodium tomato and vegetable juice. Low-sodium or reduced-sodium tomato sauce and tomato paste. Low-sodium or reduced-sodium canned vegetables. °Fruits °All fresh, dried, or frozen fruit. Canned fruit in natural juice (without added sugar). °Meat and other protein foods °Skinless chicken or turkey. Ground chicken or turkey. Pork with fat trimmed off. Fish and seafood. Egg whites. Dried beans, peas, or lentils. Unsalted nuts, nut butters, and seeds. Unsalted canned beans. Lean cuts of beef with fat trimmed off. Low-sodium, lean deli meat. °Dairy °Low-fat (1%) or fat-free (skim) milk. Fat-free, low-fat, or reduced-fat cheeses. Nonfat, low-sodium ricotta or cottage cheese. Low-fat or nonfat yogurt. Low-fat, low-sodium cheese. °Fats  and oils °Soft margarine without trans fats. Vegetable oil. Low-fat, reduced-fat, or light mayonnaise and salad dressings (reduced-sodium). Canola, safflower, olive, soybean, and sunflower oils. Avocado. °Seasoning and other foods °Herbs. Spices. Seasoning mixes without salt. Unsalted popcorn and pretzels. Fat-free sweets. °What foods are not recommended? °The items listed may not be a complete list. Talk with your dietitian about what dietary choices are best for you. °Grains °Baked goods made with fat, such as croissants, muffins, or some breads. Dry pasta or rice meal packs. °Vegetables °Creamed or fried vegetables. Vegetables in a cheese sauce. Regular canned vegetables (not low-sodium or reduced-sodium). Regular canned tomato sauce and paste (not low-sodium or reduced-sodium). Regular tomato and vegetable juice (not low-sodium or reduced-sodium). Pickles. Olives. °Fruits °Canned fruit in a light or heavy syrup. Fried fruit. Fruit in cream or butter sauce. °Meat and other protein foods °Fatty cuts   of meat. Ribs. Fried meat. Bacon. Sausage. Bologna and other processed lunch meats. Salami. Fatback. Hotdogs. Bratwurst. Salted nuts and seeds. Canned beans with added salt. Canned or smoked fish. Whole eggs or egg yolks. Chicken or turkey with skin. °Dairy °Whole or 2% milk, cream, and half-and-half. Whole or full-fat cream cheese. Whole-fat or sweetened yogurt. Full-fat cheese. Nondairy creamers. Whipped toppings. Processed cheese and cheese spreads. °Fats and oils °Butter. Stick margarine. Lard. Shortening. Ghee. Bacon fat. Tropical oils, such as coconut, palm kernel, or palm oil. °Seasoning and other foods °Salted popcorn and pretzels. Onion salt, garlic salt, seasoned salt, table salt, and sea salt. Worcestershire sauce. Tartar sauce. Barbecue sauce. Teriyaki sauce. Soy sauce, including reduced-sodium. Steak sauce. Canned and packaged gravies. Fish sauce. Oyster sauce. Cocktail sauce. Horseradish that you find on  the shelf. Ketchup. Mustard. Meat flavorings and tenderizers. Bouillon cubes. Hot sauce and Tabasco sauce. Premade or packaged marinades. Premade or packaged taco seasonings. Relishes. Regular salad dressings. °Where to find more information: °· National Heart, Lung, and Blood Institute: www.nhlbi.nih.gov °· American Heart Association: www.heart.org °Summary °· The DASH eating plan is a healthy eating plan that has been shown to reduce high blood pressure (hypertension). It may also reduce your risk for type 2 diabetes, heart disease, and stroke. °· With the DASH eating plan, you should limit salt (sodium) intake to 2,300 mg a day. If you have hypertension, you may need to reduce your sodium intake to 1,500 mg a day. °· When on the DASH eating plan, aim to eat more fresh fruits and vegetables, whole grains, lean proteins, low-fat dairy, and heart-healthy fats. °· Work with your health care provider or diet and nutrition specialist (dietitian) to adjust your eating plan to your individual calorie needs. °This information is not intended to replace advice given to you by your health care provider. Make sure you discuss any questions you have with your health care provider. °Document Revised: 03/30/2017 Document Reviewed: 04/10/2016 °Elsevier Patient Education © 2020 [employer] Inc. ° °

## 2019-06-04 ENCOUNTER — Ambulatory Visit: Payer: Self-pay | Admitting: Gerontology

## 2019-06-05 ENCOUNTER — Telehealth: Payer: Self-pay | Admitting: Gerontology

## 2019-06-05 NOTE — Telephone Encounter (Signed)
I called Christopher Harrell at 9:43am 2/4 to r/s appt. Pt answered and we were able to r/s, also activated his mychart so that he will get text reminders about appts

## 2019-06-11 ENCOUNTER — Ambulatory Visit: Payer: Self-pay | Admitting: Gerontology

## 2019-06-12 ENCOUNTER — Ambulatory Visit: Payer: Self-pay

## 2019-06-25 ENCOUNTER — Other Ambulatory Visit: Payer: Self-pay

## 2019-06-25 ENCOUNTER — Ambulatory Visit: Payer: Self-pay | Admitting: Gerontology

## 2019-06-25 ENCOUNTER — Encounter: Payer: Self-pay | Admitting: Gerontology

## 2019-06-25 VITALS — BP 157/124 | HR 82 | Temp 97.2°F | Ht 69.0 in | Wt 226.8 lb

## 2019-06-25 DIAGNOSIS — R109 Unspecified abdominal pain: Secondary | ICD-10-CM

## 2019-06-25 DIAGNOSIS — Z720 Tobacco use: Secondary | ICD-10-CM

## 2019-06-25 DIAGNOSIS — N529 Male erectile dysfunction, unspecified: Secondary | ICD-10-CM

## 2019-06-25 DIAGNOSIS — I10 Essential (primary) hypertension: Secondary | ICD-10-CM

## 2019-06-25 MED ORDER — ATENOLOL 25 MG PO TABS: 25.0000 mg | ORAL_TABLET | Freq: Every day | ORAL | 0 refills | Status: DC

## 2019-06-25 NOTE — Patient Instructions (Signed)
DASH Eating Plan DASH stands for "Dietary Approaches to Stop Hypertension." The DASH eating plan is a healthy eating plan that has been shown to reduce high blood pressure (hypertension). It may also reduce your risk for type 2 diabetes, heart disease, and stroke. The DASH eating plan may also help with weight loss. What are tips for following this plan?  General guidelines  Avoid eating more than 2,300 mg (milligrams) of salt (sodium) a day. If you have hypertension, you may need to reduce your sodium intake to 1,500 mg a day.  Limit alcohol intake to no more than 1 drink a day for nonpregnant women and 2 drinks a day for men. One drink equals 12 oz of beer, 5 oz of wine, or 1 oz of hard liquor.  Work with your health care provider to maintain a healthy body weight or to lose weight. Ask what an ideal weight is for you.  Get at least 30 minutes of exercise that causes your heart to beat faster (aerobic exercise) most days of the week. Activities may include walking, swimming, or biking.  Work with your health care provider or diet and nutrition specialist (dietitian) to adjust your eating plan to your individual calorie needs. Reading food labels   Check food labels for the amount of sodium per serving. Choose foods with less than 5 percent of the Daily Value of sodium. Generally, foods with less than 300 mg of sodium per serving fit into this eating plan.  To find whole grains, look for the word "whole" as the first word in the ingredient list. Shopping  Buy products labeled as "low-sodium" or "no salt added."  Buy fresh foods. Avoid canned foods and premade or frozen meals. Cooking  Avoid adding salt when cooking. Use salt-free seasonings or herbs instead of table salt or sea salt. Check with your health care provider or pharmacist before using salt substitutes.  Do not fry foods. Cook foods using healthy methods such as baking, boiling, grilling, and broiling instead.  Cook with  heart-healthy oils, such as olive, canola, soybean, or sunflower oil. Meal planning  Eat a balanced diet that includes: ? 5 or more servings of fruits and vegetables each day. At each meal, try to fill half of your plate with fruits and vegetables. ? Up to 6-8 servings of whole grains each day. ? Less than 6 oz of lean meat, poultry, or fish each day. A 3-oz serving of meat is about the same size as a deck of cards. One egg equals 1 oz. ? 2 servings of low-fat dairy each day. ? A serving of nuts, seeds, or beans 5 times each week. ? Heart-healthy fats. Healthy fats called Omega-3 fatty acids are found in foods such as flaxseeds and coldwater fish, like sardines, salmon, and mackerel.  Limit how much you eat of the following: ? Canned or prepackaged foods. ? Food that is high in trans fat, such as fried foods. ? Food that is high in saturated fat, such as fatty meat. ? Sweets, desserts, sugary drinks, and other foods with added sugar. ? Full-fat dairy products.  Do not salt foods before eating.  Try to eat at least 2 vegetarian meals each week.  Eat more home-cooked food and less restaurant, buffet, and fast food.  When eating at a restaurant, ask that your food be prepared with less salt or no salt, if possible. What foods are recommended? The items listed may not be a complete list. Talk with your dietitian about   what dietary choices are best for you. Grains Whole-grain or whole-wheat bread. Whole-grain or whole-wheat pasta. Brown rice. Oatmeal. Quinoa. Bulgur. Whole-grain and low-sodium cereals. Pita bread. Low-fat, low-sodium crackers. Whole-wheat flour tortillas. Vegetables Fresh or frozen vegetables (raw, steamed, roasted, or grilled). Low-sodium or reduced-sodium tomato and vegetable juice. Low-sodium or reduced-sodium tomato sauce and tomato paste. Low-sodium or reduced-sodium canned vegetables. Fruits All fresh, dried, or frozen fruit. Canned fruit in natural juice (without  added sugar). Meat and other protein foods Skinless chicken or turkey. Ground chicken or turkey. Pork with fat trimmed off. Fish and seafood. Egg whites. Dried beans, peas, or lentils. Unsalted nuts, nut butters, and seeds. Unsalted canned beans. Lean cuts of beef with fat trimmed off. Low-sodium, lean deli meat. Dairy Low-fat (1%) or fat-free (skim) milk. Fat-free, low-fat, or reduced-fat cheeses. Nonfat, low-sodium ricotta or cottage cheese. Low-fat or nonfat yogurt. Low-fat, low-sodium cheese. Fats and oils Soft margarine without trans fats. Vegetable oil. Low-fat, reduced-fat, or light mayonnaise and salad dressings (reduced-sodium). Canola, safflower, olive, soybean, and sunflower oils. Avocado. Seasoning and other foods Herbs. Spices. Seasoning mixes without salt. Unsalted popcorn and pretzels. Fat-free sweets. What foods are not recommended? The items listed may not be a complete list. Talk with your dietitian about what dietary choices are best for you. Grains Baked goods made with fat, such as croissants, muffins, or some breads. Dry pasta or rice meal packs. Vegetables Creamed or fried vegetables. Vegetables in a cheese sauce. Regular canned vegetables (not low-sodium or reduced-sodium). Regular canned tomato sauce and paste (not low-sodium or reduced-sodium). Regular tomato and vegetable juice (not low-sodium or reduced-sodium). Pickles. Olives. Fruits Canned fruit in a light or heavy syrup. Fried fruit. Fruit in cream or butter sauce. Meat and other protein foods Fatty cuts of meat. Ribs. Fried meat. Bacon. Sausage. Bologna and other processed lunch meats. Salami. Fatback. Hotdogs. Bratwurst. Salted nuts and seeds. Canned beans with added salt. Canned or smoked fish. Whole eggs or egg yolks. Chicken or turkey with skin. Dairy Whole or 2% milk, cream, and half-and-half. Whole or full-fat cream cheese. Whole-fat or sweetened yogurt. Full-fat cheese. Nondairy creamers. Whipped toppings.  Processed cheese and cheese spreads. Fats and oils Butter. Stick margarine. Lard. Shortening. Ghee. Bacon fat. Tropical oils, such as coconut, palm kernel, or palm oil. Seasoning and other foods Salted popcorn and pretzels. Onion salt, garlic salt, seasoned salt, table salt, and sea salt. Worcestershire sauce. Tartar sauce. Barbecue sauce. Teriyaki sauce. Soy sauce, including reduced-sodium. Steak sauce. Canned and packaged gravies. Fish sauce. Oyster sauce. Cocktail sauce. Horseradish that you find on the shelf. Ketchup. Mustard. Meat flavorings and tenderizers. Bouillon cubes. Hot sauce and Tabasco sauce. Premade or packaged marinades. Premade or packaged taco seasonings. Relishes. Regular salad dressings. Where to find more information:  National Heart, Lung, and Blood Institute: www.nhlbi.nih.gov  American Heart Association: www.heart.org Summary  The DASH eating plan is a healthy eating plan that has been shown to reduce high blood pressure (hypertension). It may also reduce your risk for type 2 diabetes, heart disease, and stroke.  With the DASH eating plan, you should limit salt (sodium) intake to 2,300 mg a day. If you have hypertension, you may need to reduce your sodium intake to 1,500 mg a day.  When on the DASH eating plan, aim to eat more fresh fruits and vegetables, whole grains, lean proteins, low-fat dairy, and heart-healthy fats.  Work with your health care provider or diet and nutrition specialist (dietitian) to adjust your eating plan to your   individual calorie needs. This information is not intended to replace advice given to you by your health care provider. Make sure you discuss any questions you have with your health care provider. Document Revised: 03/30/2017 Document Reviewed: 04/10/2016 Elsevier Patient Education  2020 Elsevier Inc.  

## 2019-06-25 NOTE — Progress Notes (Signed)
Established Patient Office Visit  Subjective:  Patient ID: Christopher Harrell, male    DOB: 1970/05/27  Age: 49 y.o. MRN: [id number]  CC:  Chief Complaint  Patient presents with  . Follow-up    hypertension    HPI Christopher Harrell presents for follow up of hypertension. His blood pressure is elevated during visit and he states that he has not been checking his blood pressure at home, and also not taking his 10 mg Amlodipine. He continues to smoke 1/2 pack of cigarette daily and states that he's working on cutting down. He denies headache, vision changes, chest pain, light headedness and weakness. Currently, he states that he's unable to achieve or maintain erection and has been experiencing nocturia. He denies urinary frequency, penile discharge,  abdominal pain but admits to experiencing urinary urgency and flank pain. Overall he states that he doing well, but he's worried about his blood pressure.  Past Medical History:  Diagnosis Date  . Community acquired pneumonia    a. Dx 01/21/2016 - [facility] PNA.  . Essential hypertension   . Hyperlipidemia   . Tobacco abuse     Past Surgical History:  Procedure Laterality Date  . NO PAST SURGERIES      Family History  Problem Relation Age of Onset  . Hypertension Mother   . Hypertension Father   . Heart attack Maternal Grandmother     Social History   Socioeconomic History  . Marital status: Legally Separated    Spouse name: Not on file  . Number of children: Not on file  . Years of education: Not on file  . Highest education level: Not on file  Occupational History  . Not on file  Tobacco Use  . Smoking status: Current Every Day Smoker    Packs/day: 0.50    Years: 20.00    Pack years: 10.00    Types: Cigarettes  . Smokeless tobacco: Never Used  Substance and Sexual Activity  . Alcohol use: Not Currently  . Drug use: No  . Sexual activity: Yes    Birth control/protection: None  Other Topics Concern  . Not on file  Social  History Narrative   No longer living with wife. Walking for 20 mins per day   Social Determinants of Health   Financial Resource Strain: Low Risk   . Difficulty of Paying Living Expenses: Not hard at all  Food Insecurity: No Food Insecurity  . Worried About Charity fundraiser in the Last Year: Never true  . Ran Out of Food in the Last Year: Never true  Transportation Needs:   . Lack of Transportation (Medical): Not on file  . Lack of Transportation (Non-Medical): Not on file  Physical Activity: Insufficiently Active  . Days of Exercise per Week: 5 days  . Minutes of Exercise per Session: 20 min  Stress: No Stress Concern Present  . Feeling of Stress : Not at all  Social Connections: Slightly Isolated  . Frequency of Communication with Friends and Family: More than three times a week  . Frequency of Social Gatherings with Friends and Family: More than three times a week  . Attends Religious Services: More than 4 times per year  . Active Member of Clubs or Organizations: Yes  . Attends Archivist Meetings: 1 to 4 times per year  . Marital Status: Separated  Intimate Partner Violence: Not At Risk  . Fear of Current or Ex-Partner: No  . Emotionally Abused: No  . Physically Abused: No  .  Sexually Abused: No    Outpatient Medications Prior to Visit  Medication Sig Dispense Refill  . aspirin EC 81 MG tablet Take 81 mg by mouth daily.    Marland Kitchen atenolol (TENORMIN) 50 MG tablet Take 1 tablet (50 mg total) by mouth daily. 30 tablet 2  . Multiple Vitamin (MULTIVITAMIN WITH MINERALS) TABS tablet Take 1 tablet by mouth daily.    Marland Kitchen triamterene-hydrochlorothiazide (DYAZIDE) 37.5-25 MG capsule Take 1 each (1 capsule total) by mouth daily. 30 capsule 2  . albuterol (PROVENTIL HFA;VENTOLIN HFA) 108 (90 Base) MCG/ACT inhaler Inhale 2 puffs into the lungs every 6 (six) hours as needed for wheezing or shortness of breath. (Patient not taking: Reported on 06/13/2018) 1 Inhaler 3  . amLODipine  (NORVASC) 10 MG tablet Take 1 tablet (10 mg total) by mouth daily. (Patient not taking: Reported on 06/25/2019) 30 tablet 2   No facility-administered medications prior to visit.    No Known Allergies  ROS Review of Systems  Constitutional: Negative.   Eyes: Negative.   Respiratory: Negative.   Cardiovascular: Negative.   Endocrine: Negative.   Genitourinary: Positive for flank pain and urgency. Negative for dysuria and frequency.  Musculoskeletal: Negative for back pain.  Neurological: Negative.   Psychiatric/Behavioral: Negative.       Objective:    Physical Exam  Constitutional: He is oriented to person, place, and time. He appears well-developed.  HENT:  Head: Normocephalic and atraumatic.  Eyes: Pupils are equal, round, and reactive to light. EOM are normal.  Cardiovascular: Normal rate and regular rhythm.  Pulmonary/Chest: Effort normal and breath sounds normal.  Abdominal: Soft. Bowel sounds are normal. There is CVA tenderness.  Neurological: He is alert and oriented to person, place, and time.  Psychiatric: He has a normal mood and affect. His behavior is normal. Judgment and thought content normal.    BP (!) 157/124 (BP Location: Right Arm, Patient Position: Sitting, Cuff Size: Large)   Pulse 82   Temp (!) 97.2 F (36.2 C)   Ht _0  (1.753 m)   Wt 226 lb 12.8 oz (102.9 kg)   SpO2 96%   BMI 33.49 kg/m  Wt Readings from Last 3 Encounters:  06/25/19 226 lb 12.8 oz (102.9 kg)  05/07/19 217 lb (98.4 kg)  04/18/19 212 lb (96.2 kg)   He gained 9 pounds in 6 weeks, he was encouraged to lose weight.  Health Maintenance Due  Topic Date Due  . TETANUS/TDAP  03/24/1988  . INFLUENZA VACCINE  11/30/2018  . COLONOSCOPY  03/25/2019    There are no preventive care reminders to display for this patient.  Lab Results  Component Value Date   TSH 1.110 06/13/2016   Lab Results  Component Value Date   WBC 7.8 04/18/2019   HGB 17.8 (H) 04/18/2019   HCT 50.0  04/18/2019   MCV 86.5 04/18/2019   PLT 254 04/18/2019   Lab Results  Component Value Date   NA 139 04/18/2019   K 4.0 04/18/2019   CO2 27 04/18/2019   GLUCOSE 138 (H) 04/18/2019   BUN 15 04/18/2019   CREATININE 0.94 04/18/2019   BILITOT 1.9 (H) 04/18/2019   ALKPHOS 61 04/18/2019   AST 26 04/18/2019   ALT 39 04/18/2019   PROT 8.4 (H) 04/18/2019   ALBUMIN 5.0 04/18/2019   CALCIUM 9.8 04/18/2019   ANIONGAP 12 04/18/2019   Lab Results  Component Value Date   CHOL 271 (H) 06/13/2018   Lab Results  Component Value Date  HDL 44 06/13/2018   Lab Results  Component Value Date   LDLCALC Comment 06/13/2018   Lab Results  Component Value Date   TRIG 466 (H) 06/13/2018   Lab Results  Component Value Date   CHOLHDL 6.2 (H) 06/13/2018   Lab Results  Component Value Date   HGBA1C 5.4 06/13/2018      Assessment & Plan:    1. Essential hypertension - His blood pressure in not controlled due to non compliance, he didn't bring his medication to his appointment. He was strongly advised to take his medication as prescribed, check his blood pressure daily, record and bring log to his follow up appointment. - atenolol (TENORMIN) 25 MG tablet; Take 1 tablet (25 mg total) by mouth daily.  Dispense: 30 tablet; Refill: 0 - Lipid panel; Future - Comp Met (CMET); Future - Comp Met (CMET) - Lipid panel  2. Tobacco abuse - He was strongly advised on smoking cessation and was provided with [employer] Quitline information.  3. Erectile dysfunction, unspecified erectile dysfunction type - He will follow up with [facility] at the clinic, will check PSA - PSA; Future - PSA  4. Acute flank pain Will check urine to rule out UTI. - Urinalysis; Future - UA/M w/rflx Culture, Routine; Future - CBC w/Diff; Future - CBC w/Diff    Follow-up: No follow-ups on file.    Sawyer Mentzer Jerold Coombe, NP

## 2019-06-26 ENCOUNTER — Ambulatory Visit: Payer: Self-pay | Admitting: Urology

## 2019-06-26 VITALS — BP 167/114 | HR 75 | Ht 69.0 in | Wt 225.0 lb

## 2019-06-26 DIAGNOSIS — I10 Essential (primary) hypertension: Secondary | ICD-10-CM

## 2019-06-26 DIAGNOSIS — N529 Male erectile dysfunction, unspecified: Secondary | ICD-10-CM

## 2019-06-26 DIAGNOSIS — N138 Other obstructive and reflux uropathy: Secondary | ICD-10-CM

## 2019-06-26 DIAGNOSIS — N401 Enlarged prostate with lower urinary tract symptoms: Secondary | ICD-10-CM

## 2019-06-26 DIAGNOSIS — G4730 Sleep apnea, unspecified: Secondary | ICD-10-CM

## 2019-06-26 LAB — LIPID PANEL
Chol/HDL Ratio: 5.8 ratio — ABNORMAL HIGH (ref 0.0–5.0)
Cholesterol, Total: 261 mg/dL — ABNORMAL HIGH (ref 100–199)
HDL: 45 mg/dL (ref >39)
LDL Chol Calc (NIH): 161 mg/dL — ABNORMAL HIGH (ref 0–99)
Triglycerides: 295 mg/dL — ABNORMAL HIGH (ref 0–149)
VLDL Cholesterol Cal: 55 mg/dL — ABNORMAL HIGH (ref 5–40)

## 2019-06-26 LAB — COMPREHENSIVE METABOLIC PANEL
ALT: 35 IU/L (ref 0–44)
AST: 28 IU/L (ref 0–40)
Albumin/Globulin Ratio: 1.7 (ref 1.2–2.2)
Albumin: 4.6 g/dL (ref 4.0–5.0)
Alkaline Phosphatase: 66 IU/L (ref 39–117)
BUN/Creatinine Ratio: 14 (ref 9–20)
BUN: 12 mg/dL (ref 6–24)
Bilirubin Total: 0.5 mg/dL (ref 0.0–1.2)
CO2: 23 mmol/L (ref 20–29)
Calcium: 9.9 mg/dL (ref 8.7–10.2)
Chloride: 104 mmol/L (ref 96–106)
Creatinine, Ser: 0.87 mg/dL (ref 0.76–1.27)
GFR calc Af Amer: 116 mL/min/{1.73_m2} (ref >59)
GFR calc non Af Amer: 101 mL/min/{1.73_m2} (ref >59)
Globulin, Total: 2.7 g/dL (ref 1.5–4.5)
Glucose: 100 mg/dL — ABNORMAL HIGH (ref 65–99)
Potassium: 4.5 mmol/L (ref 3.5–5.2)
Sodium: 142 mmol/L (ref 134–144)
Total Protein: 7.3 g/dL (ref 6.0–8.5)

## 2019-06-26 LAB — CBC WITH DIFFERENTIAL/PLATELET
Basophils Absolute: 0.1 10*3/uL (ref 0.0–0.2)
Basos: 1 %
EOS (ABSOLUTE): 0.2 10*3/uL (ref 0.0–0.4)
Eos: 3 %
Hematocrit: 45.3 % (ref 37.5–51.0)
Hemoglobin: 16.3 g/dL (ref 13.0–17.7)
Immature Grans (Abs): 0.1 10*3/uL (ref 0.0–0.1)
Immature Granulocytes: 1 %
Lymphocytes Absolute: 1.8 10*3/uL (ref 0.7–3.1)
Lymphs: 28 %
MCH: 31.7 pg (ref 26.6–33.0)
MCHC: 36 g/dL — ABNORMAL HIGH (ref 31.5–35.7)
MCV: 88 fL (ref 79–97)
Monocytes Absolute: 0.5 10*3/uL (ref 0.1–0.9)
Monocytes: 7 %
Neutrophils Absolute: 3.9 10*3/uL (ref 1.4–7.0)
Neutrophils: 60 %
Platelets: 260 10*3/uL (ref 150–450)
RBC: 5.14 x10E6/uL (ref 4.14–5.80)
RDW: 13.4 % (ref 11.6–15.4)
WBC: 6.6 10*3/uL (ref 3.4–10.8)

## 2019-06-26 LAB — PSA: Prostate Specific Ag, Serum: 1.1 ng/mL (ref 0.0–4.0)

## 2019-06-26 MED ORDER — DOXAZOSIN MESYLATE 4 MG PO TABS: 4.0000 mg | ORAL_TABLET | Freq: Every day | ORAL | 1 refills | Status: DC

## 2019-06-26 MED ORDER — ATORVASTATIN CALCIUM 10 MG PO TABS: 10.0000 mg | ORAL_TABLET | Freq: Every day | ORAL | 1 refills | Status: DC

## 2019-06-26 MED ORDER — SILDENAFIL CITRATE 20 MG PO TABS: ORAL_TABLET | ORAL | 1 refills | Status: DC

## 2019-06-26 NOTE — Progress Notes (Signed)
  Patient: Christopher Harrell Male    DOB: July 24, 1970   49 y.o.   MRN: [id number] Visit Date: 06/26/2019  Today's Provider: [place]   Chief Complaint  Patient presents with  . Hypertension   Subjective:    HPI  Uncontrolled HTN  - 160/120 readings at home  - states atenolol makes him tired   - amlodipine was reduced to 5 mg from 10 mg, atenolol was reduced from 50 mg to 25 mg,                Dyazide 37.5/25 mg and added hydralazine     ED  - trouble with ED for two years  BPH  - nocturia    No Known Allergies Previous Medications   ALBUTEROL (PROVENTIL HFA;VENTOLIN HFA) 108 (90 BASE) MCG/ACT INHALER    Inhale 2 puffs into the lungs every 6 (six) hours as needed for wheezing or shortness of breath.   AMLODIPINE (NORVASC) 10 MG TABLET    Take 1 tablet (10 mg total) by mouth daily.   ASPIRIN EC 81 MG TABLET    Take 81 mg by mouth daily.   ATENOLOL (TENORMIN) 25 MG TABLET    Take 1 tablet (25 mg total) by mouth daily.   MULTIPLE VITAMIN (MULTIVITAMIN WITH MINERALS) TABS TABLET    Take 1 tablet by mouth daily.   TRIAMTERENE-HYDROCHLOROTHIAZIDE (DYAZIDE) 37.5-25 MG CAPSULE    Take 1 each (1 capsule total) by mouth daily.    Review of Systems  Social History   Tobacco Use  . Smoking status: Current Every Day Smoker    Packs/day: 0.50    Years: 20.00    Pack years: 10.00    Types: Cigarettes  . Smokeless tobacco: Never Used  Substance Use Topics  . Alcohol use: Not Currently   Objective:   BP (!) 167/114   Pulse 75   Ht 5\' 9"  (1.753 m)   Wt 225 lb (102.1 kg)   SpO2 96%   BMI 33.23 kg/m   Physical Exam      Assessment & Plan:     1. Essential hypertension Medications changed yesterday  Continue to monitor the BP Keep appointment with Benjamine Mola  2. Erectile dysfunction, unspecified erectile dysfunction type Sildenafil 20 mg, 1 to 3 prior to intercourse Warned not to take with nitrates  3. BPH with obstruction/lower urinary tract  symptoms Start Cardura 4 mg daily   4. Risk factors for sleep apnea Will need sleep study     [facility]

## 2019-06-29 ENCOUNTER — Encounter: Payer: Self-pay | Admitting: Urology

## 2019-06-30 ENCOUNTER — Telehealth: Payer: Self-pay | Admitting: Urology

## 2019-06-30 NOTE — Telephone Encounter (Signed)
Christopher Harrell will be picking up a letter sometime this week stating he has uncontrolled HTN.  It is in the letter section under the media tab.

## 2019-07-01 ENCOUNTER — Ambulatory Visit: Payer: Self-pay | Admitting: Gerontology

## 2019-07-03 ENCOUNTER — Ambulatory Visit: Payer: Medicaid Other | Admitting: Ophthalmology

## 2019-07-08 ENCOUNTER — Ambulatory Visit: Payer: Self-pay | Admitting: Gerontology

## 2019-07-15 ENCOUNTER — Ambulatory Visit: Payer: Self-pay | Admitting: Gerontology

## 2019-07-15 ENCOUNTER — Other Ambulatory Visit: Payer: Self-pay

## 2019-07-16 ENCOUNTER — Ambulatory Visit: Payer: Self-pay | Admitting: Gerontology

## 2019-07-16 ENCOUNTER — Encounter: Payer: Self-pay | Admitting: Gerontology

## 2019-07-16 VITALS — BP 146/97 | HR 71 | Wt 224.0 lb

## 2019-07-16 DIAGNOSIS — I10 Essential (primary) hypertension: Secondary | ICD-10-CM

## 2019-07-16 DIAGNOSIS — F17200 Nicotine dependence, unspecified, uncomplicated: Secondary | ICD-10-CM

## 2019-07-16 MED ORDER — AMLODIPINE BESYLATE 10 MG PO TABS: 10.0000 mg | ORAL_TABLET | Freq: Every day | ORAL | 2 refills | Status: DC

## 2019-07-16 MED ORDER — TRIAMTERENE-HCTZ 37.5-25 MG PO CAPS: 1.0000 | ORAL_CAPSULE | Freq: Every day | ORAL | 2 refills | Status: DC

## 2019-07-16 MED ORDER — ASPIRIN EC 81 MG PO TBEC: 81.0000 mg | DELAYED_RELEASE_TABLET | Freq: Every day | ORAL | 3 refills | Status: DC

## 2019-07-16 MED ORDER — ATENOLOL 50 MG PO TABS: 50.0000 mg | ORAL_TABLET | Freq: Every day | ORAL | 2 refills | Status: DC

## 2019-07-16 NOTE — Progress Notes (Signed)
Established Patient Office Visit  Subjective:  Patient ID: Christopher Harrell, male    DOB: [birth year]  Age: 49 y.o. MRN: [id number]  CC: No chief complaint on file.   HPI Christopher Harrell presents for follow up of uncontrolled hypertension and medication refill. He states that he doesn't remember to check his blood pressure at home. He states that he's compliant with his medications but was out of 10 mg Amlodipine for 5 days. He states that he smokes 1 pack of cigarette daily, and admits the desire to quit. He also reports that he has not been taking his Atorvastatin 10 mg for the past one week, but he denies myalgia and muscle weakness. He denies chest pain, palpitation, light headedness and visual changes. Overall he states that he's doing well and offers no further complaint.  Past Medical History:  Diagnosis Date  . Community acquired pneumonia    a. Dx 01/21/2016 - [facility].  . Essential hypertension   . Hyperlipidemia   . Tobacco abuse     Past Surgical History:  Procedure Laterality Date  . NO PAST SURGERIES      Family History  Problem Relation Age of Onset  . Hypertension Mother   . Hypertension Father   . Heart attack Maternal Grandmother     Social History   Socioeconomic History  . Marital status: Legally Separated    Spouse name: Not on file  . Number of children: Not on file  . Years of education: Not on file  . Highest education level: Not on file  Occupational History  . Not on file  Tobacco Use  . Smoking status: Current Every Day Smoker    Packs/day: 0.50    Years: 20.00    Pack years: 10.00    Types: Cigarettes  . Smokeless tobacco: Never Used  Substance and Sexual Activity  . Alcohol use: Not Currently  . Drug use: No  . Sexual activity: Yes    Birth control/protection: None  Other Topics Concern  . Not on file  Social History Narrative   No longer living with wife. Walking for 20 mins per day   Social Determinants of Health   Financial  Resource Strain: Low Risk   . Difficulty of Paying Living Expenses: Not hard at all  Food Insecurity: No Food Insecurity  . Worried About Charity fundraiser in the Last Year: Never true  . Ran Out of Food in the Last Year: Never true  Transportation Needs:   . Lack of Transportation (Medical):   Marland Kitchen Lack of Transportation (Non-Medical):   Physical Activity: Insufficiently Active  . Days of Exercise per Week: 5 days  . Minutes of Exercise per Session: 20 min  Stress: No Stress Concern Present  . Feeling of Stress : Not at all  Social Connections: Slightly Isolated  . Frequency of Communication with Friends and Family: More than three times a week  . Frequency of Social Gatherings with Friends and Family: More than three times a week  . Attends Religious Services: More than 4 times per year  . Active Member of Clubs or Organizations: Yes  . Attends Archivist Meetings: 1 to 4 times per year  . Marital Status: Separated  Intimate Partner Violence: Not At Risk  . Fear of Current or Ex-Partner: No  . Emotionally Abused: No  . Physically Abused: No  . Sexually Abused: No    Outpatient Medications Prior to Visit  Medication Sig Dispense Refill  . amLODipine (  NORVASC) 10 MG tablet Take 1 tablet (10 mg total) by mouth daily. 30 tablet 2  . aspirin EC 81 MG tablet Take 81 mg by mouth daily.    Marland Kitchen atenolol (TENORMIN) 25 MG tablet Take 1 tablet (25 mg total) by mouth daily. 30 tablet 0  . triamterene-hydrochlorothiazide (DYAZIDE) 37.5-25 MG capsule Take 1 each (1 capsule total) by mouth daily. 30 capsule 2  . albuterol (PROVENTIL HFA;VENTOLIN HFA) 108 (90 Base) MCG/ACT inhaler Inhale 2 puffs into the lungs every 6 (six) hours as needed for wheezing or shortness of breath. (Patient not taking: Reported on 06/13/2018) 1 Inhaler 3  . atorvastatin (LIPITOR) 10 MG tablet Take 1 tablet (10 mg total) by mouth daily. (Patient not taking: Reported on 07/16/2019) 30 tablet 1  . doxazosin (CARDURA)  4 MG tablet Take 1 tablet (4 mg total) by mouth daily. (Patient not taking: Reported on 07/16/2019) 30 tablet 1  . Multiple Vitamin (MULTIVITAMIN WITH MINERALS) TABS tablet Take 1 tablet by mouth daily.    . sildenafil (REVATIO) 20 MG tablet Take 1 to 3 tablets two hours before intercouse on an empty stomach.  Do not take with nitrates. (Patient not taking: Reported on 07/16/2019) 30 tablet 1   No facility-administered medications prior to visit.    No Known Allergies  ROS Review of Systems  Constitutional: Negative.   Eyes: Negative.   Respiratory: Negative.   Cardiovascular: Negative.   Neurological: Negative.   Psychiatric/Behavioral: Negative.       Objective:    Physical Exam  Constitutional: He is oriented to person, place, and time. He appears well-developed.  HENT:  Head: Normocephalic and atraumatic.  Eyes: Pupils are equal, round, and reactive to light. EOM are normal.  Cardiovascular: Normal rate and regular rhythm.  Pulmonary/Chest: Effort normal and breath sounds normal.  Neurological: He is alert and oriented to person, place, and time.  Psychiatric: He has a normal mood and affect. His behavior is normal. Judgment and thought content normal.    BP (!) 146/97 (BP Location: Left Arm, Patient Position: Sitting, Cuff Size: Large)   Pulse 71   Wt 224 lb (101.6 kg)   BMI 33.08 kg/m  Wt Readings from Last 3 Encounters:  07/16/19 224 lb (101.6 kg)  06/26/19 225 lb (102.1 kg)  06/25/19 226 lb 12.8 oz (102.9 kg)   He was advised to continue on his weight loss regimen.  Health Maintenance Due  Topic Date Due  . TETANUS/TDAP  Never done  . INFLUENZA VACCINE  11/30/2018  . COLONOSCOPY  Never done    There are no preventive care reminders to display for this patient.  Lab Results  Component Value Date   TSH 1.110 06/13/2016   Lab Results  Component Value Date   WBC 6.6 06/25/2019   HGB 16.3 06/25/2019   HCT 45.3 06/25/2019   MCV 88 06/25/2019   PLT 260  06/25/2019   Lab Results  Component Value Date   NA 142 06/25/2019   K 4.5 06/25/2019   CO2 23 06/25/2019   GLUCOSE 100 (H) 06/25/2019   BUN 12 06/25/2019   CREATININE 0.87 06/25/2019   BILITOT 0.5 06/25/2019   ALKPHOS 66 06/25/2019   AST 28 06/25/2019   ALT 35 06/25/2019   PROT 7.3 06/25/2019   ALBUMIN 4.6 06/25/2019   CALCIUM 9.9 06/25/2019   ANIONGAP 12 04/18/2019   Lab Results  Component Value Date   CHOL 261 (H) 06/25/2019   Lab Results  Component Value Date  HDL 45 06/25/2019   Lab Results  Component Value Date   LDLCALC 161 (H) 06/25/2019   Lab Results  Component Value Date   TRIG 295 (H) 06/25/2019   Lab Results  Component Value Date   CHOLHDL 5.8 (H) 06/25/2019   Lab Results  Component Value Date   HGBA1C 5.4 06/13/2018      Assessment & Plan:    1. Essential hypertension - His blood pressure is improving and he will continue on his current treatment regimen. - He was advised to continue on Low salt DASH diet -Take medications regularly on time -Exercise regularly as tolerated -Check blood pressure at least once a week at home or a nearby pharmacy and record and bring log to follow up appointment. -Goal is less than 140/90 and normal blood pressure is 120/80 - amLODipine (NORVASC) 10 MG tablet; Take 1 tablet (10 mg total) by mouth daily.  Dispense: 30 tablet; Refill: 2 - aspirin EC 81 MG tablet; Take 1 tablet (81 mg total) by mouth daily.  Dispense: 30 tablet; Refill: 3 - atenolol (TENORMIN) 50 MG tablet; Take 1 tablet (50 mg total) by mouth daily.  Dispense: 30 tablet; Refill: 2 - triamterene-hydrochlorothiazide (DYAZIDE) 37.5-25 MG capsule; Take 1 each (1 capsule total) by mouth daily.  Dispense: 30 capsule; Refill: 2  2. Smoking - He was strongly encouraged on smoking cessation and was provided with the [place] Quitline information.    Follow-up: Return in about 4 weeks (around 08/13/2019), or if symptoms worsen or fail to improve.    Fiona Coto  Jerold Coombe, NP

## 2019-07-16 NOTE — Patient Instructions (Signed)
Steps to Quit Smoking Smoking tobacco is the leading cause of preventable death. It can affect almost every organ in the body. Smoking puts you and people around you at risk for many serious, long-lasting (chronic) diseases. Quitting smoking can be hard, but it is one of the best things that you can do for your health. It is never too late to quit. How do I get ready to quit? When you decide to quit smoking, make a plan to help you succeed. Before you quit:  Pick a date to quit. Set a date within the next 2 weeks to give you time to prepare.  Write down the reasons why you are quitting. Keep this list in places where you will see it often.  Tell your family, friends, and co-workers that you are quitting. Their support is important.  Talk with your doctor about the choices that may help you quit.  Find out if your health insurance will pay for these treatments.  Know the people, places, things, and activities that make you want to smoke (triggers). Avoid them. What first steps can I take to quit smoking?  Throw away all cigarettes at home, at work, and in your car.  Throw away the things that you use when you smoke, such as ashtrays and lighters.  Clean your car. Make sure to empty the ashtray.  Clean your home, including curtains and carpets. What can I do to help me quit smoking? Talk with your doctor about taking medicines and seeing a counselor at the same time. You are more likely to succeed when you do both.  If you are pregnant or breastfeeding, talk with your doctor about counseling or other ways to quit smoking. Do not take medicine to help you quit smoking unless your doctor tells you to do so. To quit smoking: Quit right away  Quit smoking totally, instead of slowly cutting back on how much you smoke over a period of time.  Go to counseling. You are more likely to quit if you go to counseling sessions regularly. Take medicine You may take medicines to help you quit. Some  medicines need a prescription, and some you can buy over-the-counter. Some medicines may contain a drug called nicotine to replace the nicotine in cigarettes. Medicines may:  Help you to stop having the desire to smoke (cravings).  Help to stop the problems that come when you stop smoking (withdrawal symptoms). Your doctor may ask you to use:  Nicotine patches, gum, or lozenges.  Nicotine inhalers or sprays.  Non-nicotine medicine that is taken by mouth. Find resources Find resources and other ways to help you quit smoking and remain smoke-free after you quit. These resources are most helpful when you use them often. They include:  Online chats with a counselor.  Phone quitlines.  Printed self-help materials.  Support groups or group counseling.  Text messaging programs.  Mobile phone apps. Use apps on your mobile phone or tablet that can help you stick to your quit plan. There are many free apps for mobile phones and tablets as well as websites. Examples include Quit Guide from the CDC and smokefree.gov  What things can I do to make it easier to quit?   Talk to your family and friends. Ask them to support and encourage you.  Call a phone quitline (1-800-QUIT-NOW), reach out to support groups, or work with a counselor.  Ask people who smoke to not smoke around you.  Avoid places that make you want to smoke,   such as: ? Bars. ? Parties. ? Smoke-break areas at work.  Spend time with people who do not smoke.  Lower the stress in your life. Stress can make you want to smoke. Try these things to help your stress: ? Getting regular exercise. ? Doing deep-breathing exercises. ? Doing yoga. ? Meditating. ? Doing a body scan. To do this, close your eyes, focus on one area of your body at a time from head to toe. Notice which parts of your body are tense. Try to relax the muscles in those areas. How will I feel when I quit smoking? Day 1 to 3 weeks Within the first 24 hours,  you may start to have some problems that come from quitting tobacco. These problems are very bad 2-3 days after you quit, but they do not often last for more than 2-3 weeks. You may get these symptoms:  Mood swings.  Feeling restless, nervous, angry, or annoyed.  Trouble concentrating.  Dizziness.  Strong desire for high-sugar foods and nicotine.  Weight gain.  Trouble pooping (constipation).  Feeling like you may vomit (nausea).  Coughing or a sore throat.  Changes in how the medicines that you take for other issues work in your body.  Depression.  Trouble sleeping (insomnia). Week 3 and afterward After the first 2-3 weeks of quitting, you may start to notice more positive results, such as:  Better sense of smell and taste.  Less coughing and sore throat.  Slower heart rate.  Lower blood pressure.  Clearer skin.  Better breathing.  Fewer sick days. Quitting smoking can be hard. Do not give up if you fail the first time. Some people need to try a few times before they succeed. Do your best to stick to your quit plan, and talk with your doctor if you have any questions or concerns. Summary  Smoking tobacco is the leading cause of preventable death. Quitting smoking can be hard, but it is one of the best things that you can do for your health.  When you decide to quit smoking, make a plan to help you succeed.  Quit smoking right away, not slowly over a period of time.  When you start quitting, seek help from your doctor, family, or friends. This information is not intended to replace advice given to you by your health care provider. Make sure you discuss any questions you have with your health care provider. Document Revised: 01/10/2019 Document Reviewed: 07/06/2018 Elsevier Patient Education  2020 Elsevier Inc.    Managing Your Hypertension Hypertension is commonly called high blood pressure. This is when the force of your blood pressing against the walls of  your arteries is too strong. Arteries are blood vessels that carry blood from your heart throughout your body. Hypertension forces the heart to work harder to pump blood, and may cause the arteries to become narrow or stiff. Having untreated or uncontrolled hypertension can cause heart attack, stroke, kidney disease, and other problems. What are blood pressure readings? A blood pressure reading consists of a higher number over a lower number. Ideally, your blood pressure should be below 120/80. The first ("top") number is called the systolic pressure. It is a measure of the pressure in your arteries as your heart beats. The second ("bottom") number is called the diastolic pressure. It is a measure of the pressure in your arteries as the heart relaxes. What does my blood pressure reading mean? Blood pressure is classified into four stages. Based on your blood pressure reading,   your health care provider may use the following stages to determine what type of treatment you need, if any. Systolic pressure and diastolic pressure are measured in a unit called mm Hg. Normal  Systolic pressure: below 120.  Diastolic pressure: below 80. Elevated  Systolic pressure: 120-129.  Diastolic pressure: below 80. Hypertension stage 1  Systolic pressure: 130-139.  Diastolic pressure: 80-89. Hypertension stage 2  Systolic pressure: 140 or above.  Diastolic pressure: 90 or above. What health risks are associated with hypertension? Managing your hypertension is an important responsibility. Uncontrolled hypertension can lead to:  A heart attack.  A stroke.  A weakened blood vessel (aneurysm).  Heart failure.  Kidney damage.  Eye damage.  Metabolic syndrome.  Memory and concentration problems. What changes can I make to manage my hypertension? Hypertension can be managed by making lifestyle changes and possibly by taking medicines. Your health care provider will help you make a plan to bring your  blood pressure within a normal range. Eating and drinking   Eat a diet that is high in fiber and potassium, and low in salt (sodium), added sugar, and fat. An example eating plan is called the DASH (Dietary Approaches to Stop Hypertension) diet. To eat this way: ? Eat plenty of fresh fruits and vegetables. Try to fill half of your plate at each meal with fruits and vegetables. ? Eat whole grains, such as whole wheat pasta, brown rice, or whole grain bread. Fill about one quarter of your plate with whole grains. ? Eat low-fat diary products. ? Avoid fatty cuts of meat, processed or cured meats, and poultry with skin. Fill about one quarter of your plate with lean proteins such as fish, chicken without skin, beans, eggs, and tofu. ? Avoid premade and processed foods. These tend to be higher in sodium, added sugar, and fat.  Reduce your daily sodium intake. Most people with hypertension should eat less than 1,500 mg of sodium a day.  Limit alcohol intake to no more than 1 drink a day for nonpregnant women and 2 drinks a day for men. One drink equals 12 oz of beer, 5 oz of wine, or 1 oz of hard liquor. Lifestyle  Work with your health care provider to maintain a healthy body weight, or to lose weight. Ask what an ideal weight is for you.  Get at least 30 minutes of exercise that causes your heart to beat faster (aerobic exercise) most days of the week. Activities may include walking, swimming, or biking.  Include exercise to strengthen your muscles (resistance exercise), such as weight lifting, as part of your weekly exercise routine. Try to do these types of exercises for 30 minutes at least 3 days a week.  Do not use any products that contain nicotine or tobacco, such as cigarettes and e-cigarettes. If you need help quitting, ask your health care provider.  Control any long-term (chronic) conditions you have, such as high cholesterol or diabetes. Monitoring  Monitor your blood pressure at  home as told by your health care provider. Your personal target blood pressure may vary depending on your medical conditions, your age, and other factors.  Have your blood pressure checked regularly, as often as told by your health care provider. Working with your health care provider  Review all the medicines you take with your health care provider because there may be side effects or interactions.  Talk with your health care provider about your diet, exercise habits, and other lifestyle factors that may be contributing   to hypertension.  Visit your health care provider regularly. Your health care provider can help you create and adjust your plan for managing hypertension. Will I need medicine to control my blood pressure? Your health care provider may prescribe medicine if lifestyle changes are not enough to get your blood pressure under control, and if:  Your systolic blood pressure is 130 or higher.  Your diastolic blood pressure is 80 or higher. Take medicines only as told by your health care provider. Follow the directions carefully. Blood pressure medicines must be taken as prescribed. The medicine does not work as well when you skip doses. Skipping doses also puts you at risk for problems. Contact a health care provider if:  You think you are having a reaction to medicines you have taken.  You have repeated (recurrent) headaches.  You feel dizzy.  You have swelling in your ankles.  You have trouble with your vision. Get help right away if:  You develop a severe headache or confusion.  You have unusual weakness or numbness, or you feel faint.  You have severe pain in your chest or abdomen.  You vomit repeatedly.  You have trouble breathing. Summary  Hypertension is when the force of blood pumping through your arteries is too strong. If this condition is not controlled, it may put you at risk for serious complications.  Your personal target blood pressure may vary  depending on your medical conditions, your age, and other factors. For most people, a normal blood pressure is less than 120/80.  Hypertension is managed by lifestyle changes, medicines, or both. Lifestyle changes include weight loss, eating a healthy, low-sodium diet, exercising more, and limiting alcohol. This information is not intended to replace advice given to you by your health care provider. Make sure you discuss any questions you have with your health care provider. Document Revised: 08/09/2018 Document Reviewed: 03/15/2016 Elsevier Patient Education  2020 Elsevier Inc.  

## 2019-07-19 ENCOUNTER — Other Ambulatory Visit: Payer: Self-pay | Admitting: Urology

## 2019-07-27 ENCOUNTER — Other Ambulatory Visit: Payer: Self-pay | Admitting: Urology

## 2019-08-02 ENCOUNTER — Ambulatory Visit: Payer: Self-pay | Attending: Internal Medicine

## 2019-08-02 DIAGNOSIS — Z23 Encounter for immunization: Secondary | ICD-10-CM

## 2019-08-02 NOTE — Progress Notes (Signed)
   Covid-19 Vaccination [facility]  Name:  Christopher Harrell    MRN: [id number] DOB: 11-04-1970  08/02/2019  Mr. [name] was observed post Covid-19 immunization for 15 minutes without incident. He was provided with Vaccine Information Sheet and instruction to access the V-Safe system.   Mr. [name] was instructed to call 911 with any severe reactions post vaccine: Marland Kitchen Difficulty breathing  . Swelling of face and throat  . A fast heartbeat  . A bad rash all over body  . Dizziness and weakness   Immunizations Administered    Name Date Dose VIS Date Route   Pfizer COVID-19 Vaccine 08/02/2019  9:28 AM 0.3 mL 04/11/2019 Intramuscular   Manufacturer: [place]   Lot: [id number]   [place]: [id number]

## 2019-08-13 ENCOUNTER — Other Ambulatory Visit: Payer: Self-pay

## 2019-08-13 ENCOUNTER — Encounter: Payer: Self-pay | Admitting: Gerontology

## 2019-08-13 ENCOUNTER — Ambulatory Visit: Payer: Self-pay | Admitting: Gerontology

## 2019-08-13 VITALS — BP 134/90 | HR 80 | Temp 97.2°F | Ht 69.0 in | Wt 227.8 lb

## 2019-08-13 DIAGNOSIS — Z0000 Encounter for general adult medical examination without abnormal findings: Secondary | ICD-10-CM

## 2019-08-13 DIAGNOSIS — F17200 Nicotine dependence, unspecified, uncomplicated: Secondary | ICD-10-CM

## 2019-08-13 DIAGNOSIS — I10 Essential (primary) hypertension: Secondary | ICD-10-CM | POA: Insufficient documentation

## 2019-08-13 DIAGNOSIS — E781 Pure hyperglyceridemia: Secondary | ICD-10-CM

## 2019-08-13 NOTE — Progress Notes (Signed)
Established Patient Office Visit  Subjective:  Patient ID: Christopher Harrell, male    DOB: 10/21/1970  Age: 49 y.o. MRN: [id number]  CC:  Chief Complaint  Patient presents with  . Follow-up    also notes vision problems    HPI Christopher Harrell presents for follow up of  Hypertension, hyperlipidemia and medication refill. He states that he's compliant with his medication and checks his blood pressure once a week.  He states that his blood pressure is usually less than 140/90, he continues on DASH diet and exercises as tolerated.  He continues to smoke 1 pack of cigarette daily and admits to desire to quit.  Currently he states that he has been experiencing blurry vision and unable to read smal print, and requests an eye exam.  Overall he states that he is doing well and offers no further complaints.  Past Medical History:  Diagnosis Date  . Community acquired pneumonia    a. Dx 01/21/2016 - LLL PNA.  . Essential hypertension   . Hyperlipidemia   . Tobacco abuse     Past Surgical History:  Procedure Laterality Date  . NO PAST SURGERIES      Family History  Problem Relation Age of Onset  . Hypertension Mother   . Hypertension Father   . Heart attack Maternal Grandmother     Social History   Socioeconomic History  . Marital status: Legally Separated    Spouse name: Not on file  . Number of children: Not on file  . Years of education: Not on file  . Highest education level: Not on file  Occupational History  . Not on file  Tobacco Use  . Smoking status: Current Every Day Smoker    Packs/day: 0.50    Years: 20.00    Pack years: 10.00    Types: Cigarettes  . Smokeless tobacco: Never Used  Substance and Sexual Activity  . Alcohol use: Not Currently  . Drug use: No  . Sexual activity: Yes    Birth control/protection: None  Other Topics Concern  . Not on file  Social History Narrative   No longer living with wife. Walking for 20 mins per day   Social Determinants of  Health   Financial Resource Strain: Low Risk   . Difficulty of Paying Living Expenses: Not hard at all  Food Insecurity: No Food Insecurity  . Worried About Charity fundraiser in the Last Year: Never true  . Ran Out of Food in the Last Year: Never true  Transportation Needs:   . Lack of Transportation (Medical):   Marland Kitchen Lack of Transportation (Non-Medical):   Physical Activity: Insufficiently Active  . Days of Exercise per Week: 5 days  . Minutes of Exercise per Session: 20 min  Stress: No Stress Concern Present  . Feeling of Stress : Not at all  Social Connections: Slightly Isolated  . Frequency of Communication with Friends and Family: More than three times a week  . Frequency of Social Gatherings with Friends and Family: More than three times a week  . Attends Religious Services: More than 4 times per year  . Active Member of Clubs or Organizations: Yes  . Attends Archivist Meetings: 1 to 4 times per year  . Marital Status: Separated  Intimate Partner Violence: Not At Risk  . Fear of Current or Ex-Partner: No  . Emotionally Abused: No  . Physically Abused: No  . Sexually Abused: No    Outpatient Medications Prior to  Visit  Medication Sig Dispense Refill  . albuterol (PROVENTIL HFA;VENTOLIN HFA) 108 (90 Base) MCG/ACT inhaler Inhale 2 puffs into the lungs every 6 (six) hours as needed for wheezing or shortness of breath. 1 Inhaler 3  . amLODipine (NORVASC) 10 MG tablet Take 1 tablet (10 mg total) by mouth daily. 30 tablet 2  . aspirin EC 81 MG tablet Take 1 tablet (81 mg total) by mouth daily. 30 tablet 3  . atenolol (TENORMIN) 50 MG tablet Take 1 tablet (50 mg total) by mouth daily. 30 tablet 2  . atorvastatin (LIPITOR) 10 MG tablet Take 1 tablet (10 mg total) by mouth daily. 30 tablet 1  . doxazosin (CARDURA) 4 MG tablet Take 1 tablet (4 mg total) by mouth daily. 30 tablet 1  . Multiple Vitamin (MULTIVITAMIN WITH MINERALS) TABS tablet Take 1 tablet by mouth daily.     Marland Kitchen triamterene-hydrochlorothiazide (DYAZIDE) 37.5-25 MG capsule Take 1 each (1 capsule total) by mouth daily. 30 capsule 2  . sildenafil (REVATIO) 20 MG tablet Take 1 to 3 tablets two hours before intercouse on an empty stomach.  Do not take with nitrates. (Patient not taking: Reported on 08/13/2019) 30 tablet 1   No facility-administered medications prior to visit.    No Known Allergies  ROS Review of Systems  Eyes: Positive for visual disturbance.  Respiratory: Negative.   Cardiovascular: Negative.   Neurological: Negative.       Objective:    Physical Exam  Constitutional: He is oriented to person, place, and time. He appears well-developed.  HENT:  Head: Normocephalic and atraumatic.  Eyes: Pupils are equal, round, and reactive to light. EOM are normal.  Cardiovascular: Normal rate and regular rhythm.  Pulmonary/Chest: Effort normal and breath sounds normal.  Neurological: He is alert and oriented to person, place, and time.  Psychiatric: He has a normal mood and affect. His behavior is normal. Judgment and thought content normal.    BP 134/90 (BP Location: Left Arm, Patient Position: Sitting, Cuff Size: Large)   Pulse 80   Temp (!) 97.2 F (36.2 C)   Ht 5\' 9"  (1.753 m)   Wt 227 lb 12.8 oz (103.3 kg)   SpO2 95%   BMI 33.64 kg/m  Wt Readings from Last 3 Encounters:  08/13/19 227 lb 12.8 oz (103.3 kg)  07/16/19 224 lb (101.6 kg)  06/26/19 225 lb (102.1 kg)   He was advised to continue on a weight loss regimen.  Health Maintenance Due  Topic Date Due  . TETANUS/TDAP  Never done  . COLONOSCOPY  Never done    There are no preventive care reminders to display for this patient.  Lab Results  Component Value Date   TSH 1.110 06/13/2016   Lab Results  Component Value Date   WBC 6.6 06/25/2019   HGB 16.3 06/25/2019   HCT 45.3 06/25/2019   MCV 88 06/25/2019   PLT 260 06/25/2019   Lab Results  Component Value Date   NA 142 06/25/2019   K 4.5 06/25/2019    CO2 23 06/25/2019   GLUCOSE 100 (H) 06/25/2019   BUN 12 06/25/2019   CREATININE 0.87 06/25/2019   BILITOT 0.5 06/25/2019   ALKPHOS 66 06/25/2019   AST 28 06/25/2019   ALT 35 06/25/2019   PROT 7.3 06/25/2019   ALBUMIN 4.6 06/25/2019   CALCIUM 9.9 06/25/2019   ANIONGAP 12 04/18/2019   Lab Results  Component Value Date   CHOL 261 (H) 06/25/2019   Lab Results  Component Value Date   HDL 45 06/25/2019   Lab Results  Component Value Date   LDLCALC 161 (H) 06/25/2019   Lab Results  Component Value Date   TRIG 295 (H) 06/25/2019   Lab Results  Component Value Date   CHOLHDL 5.8 (H) 06/25/2019   Lab Results  Component Value Date   HGBA1C 5.4 06/13/2018      Assessment & Plan:    1. Essential hypertension -His blood pressure is under control and he will continue on current treatment regimen. -Low salt DASH diet - Take regularly on time -Exercise regularly as tolerated -Check blood pressure at least once a week at home or a nearby pharmacy and record -Goal is less than 140/90 and normal blood pressure is 120/80    2. Hypertriglyceridemia -He will continue on current treatment regimen and will recheck- Lipid panel; Future -Low fat Diet, like low fat dairy products eg skimmed milk -Avoid any fried food -exercise/walk as tolerated -Goal for Total Cholesterol is less than 200 -Goal for bad cholesterol LDL is less than 70 -Goal for Good cholesterol HDL is more than 45 -Goal for Triglyceride is less than 150   3. Smoking -He was encouraged on smoking cessation and was provided with [place] quit line information.  4. Health care maintenance  - Ambulatory referral to Ophthalmology    Follow-up: Return in about 7 weeks (around 10/01/2019), or if symptoms worsen or fail to improve.    Ayannah Faddis Jerold Coombe, NP

## 2019-08-13 NOTE — Patient Instructions (Signed)
DASH Eating Plan DASH stands for "Dietary Approaches to Stop Hypertension." The DASH eating plan is a healthy eating plan that has been shown to reduce high blood pressure (hypertension). It may also reduce your risk for type 2 diabetes, heart disease, and stroke. The DASH eating plan may also help with weight loss. What are tips for following this plan?  General guidelines  Avoid eating more than 2,300 mg (milligrams) of salt (sodium) a day. If you have hypertension, you may need to reduce your sodium intake to 1,500 mg a day.  Limit alcohol intake to no more than 1 drink a day for nonpregnant women and 2 drinks a day for men. One drink equals 12 oz of beer, 5 oz of wine, or 1 oz of hard liquor.  Work with your health care provider to maintain a healthy body weight or to lose weight. Ask what an ideal weight is for you.  Get at least 30 minutes of exercise that causes your heart to beat faster (aerobic exercise) most days of the week. Activities may include walking, swimming, or biking.  Work with your health care provider or diet and nutrition specialist (dietitian) to adjust your eating plan to your individual calorie needs. Reading food labels   Check food labels for the amount of sodium per serving. Choose foods with less than 5 percent of the Daily Value of sodium. Generally, foods with less than 300 mg of sodium per serving fit into this eating plan.  To find whole grains, look for the word "whole" as the first word in the ingredient list. Shopping  Buy products labeled as "low-sodium" or "no salt added."  Buy fresh foods. Avoid canned foods and premade or frozen meals. Cooking  Avoid adding salt when cooking. Use salt-free seasonings or herbs instead of table salt or sea salt. Check with your health care provider or pharmacist before using salt substitutes.  Do not fry foods. Cook foods using healthy methods such as baking, boiling, grilling, and broiling instead.  Cook with  heart-healthy oils, such as olive, canola, soybean, or sunflower oil. Meal planning  Eat a balanced diet that includes: ? 5 or more servings of fruits and vegetables each day. At each meal, try to fill half of your plate with fruits and vegetables. ? Up to 6-8 servings of whole grains each day. ? Less than 6 oz of lean meat, poultry, or fish each day. A 3-oz serving of meat is about the same size as a deck of cards. One egg equals 1 oz. ? 2 servings of low-fat dairy each day. ? A serving of nuts, seeds, or beans 5 times each week. ? Heart-healthy fats. Healthy fats called Omega-3 fatty acids are found in foods such as flaxseeds and coldwater fish, like sardines, salmon, and mackerel.  Limit how much you eat of the following: ? Canned or prepackaged foods. ? Food that is high in trans fat, such as fried foods. ? Food that is high in saturated fat, such as fatty meat. ? Sweets, desserts, sugary drinks, and other foods with added sugar. ? Full-fat dairy products.  Do not salt foods before eating.  Try to eat at least 2 vegetarian meals each week.  Eat more home-cooked food and less restaurant, buffet, and fast food.  When eating at a restaurant, ask that your food be prepared with less salt or no salt, if possible. What foods are recommended? The items listed may not be a complete list. Talk with your dietitian about   what dietary choices are best for you. Grains Whole-grain or whole-wheat bread. Whole-grain or whole-wheat pasta. Brown rice. Oatmeal. Quinoa. Bulgur. Whole-grain and low-sodium cereals. Pita bread. Low-fat, low-sodium crackers. Whole-wheat flour tortillas. Vegetables Fresh or frozen vegetables (raw, steamed, roasted, or grilled). Low-sodium or reduced-sodium tomato and vegetable juice. Low-sodium or reduced-sodium tomato sauce and tomato paste. Low-sodium or reduced-sodium canned vegetables. Fruits All fresh, dried, or frozen fruit. Canned fruit in natural juice (without  added sugar). Meat and other protein foods Skinless chicken or turkey. Ground chicken or turkey. Pork with fat trimmed off. Fish and seafood. Egg whites. Dried beans, peas, or lentils. Unsalted nuts, nut butters, and seeds. Unsalted canned beans. Lean cuts of beef with fat trimmed off. Low-sodium, lean deli meat. Dairy Low-fat (1%) or fat-free (skim) milk. Fat-free, low-fat, or reduced-fat cheeses. Nonfat, low-sodium ricotta or cottage cheese. Low-fat or nonfat yogurt. Low-fat, low-sodium cheese. Fats and oils Soft margarine without trans fats. Vegetable oil. Low-fat, reduced-fat, or light mayonnaise and salad dressings (reduced-sodium). Canola, safflower, olive, soybean, and sunflower oils. Avocado. Seasoning and other foods Herbs. Spices. Seasoning mixes without salt. Unsalted popcorn and pretzels. Fat-free sweets. What foods are not recommended? The items listed may not be a complete list. Talk with your dietitian about what dietary choices are best for you. Grains Baked goods made with fat, such as croissants, muffins, or some breads. Dry pasta or rice meal packs. Vegetables Creamed or fried vegetables. Vegetables in a cheese sauce. Regular canned vegetables (not low-sodium or reduced-sodium). Regular canned tomato sauce and paste (not low-sodium or reduced-sodium). Regular tomato and vegetable juice (not low-sodium or reduced-sodium). Pickles. Olives. Fruits Canned fruit in a light or heavy syrup. Fried fruit. Fruit in cream or butter sauce. Meat and other protein foods Fatty cuts of meat. Ribs. Fried meat. Bacon. Sausage. Bologna and other processed lunch meats. Salami. Fatback. Hotdogs. Bratwurst. Salted nuts and seeds. Canned beans with added salt. Canned or smoked fish. Whole eggs or egg yolks. Chicken or turkey with skin. Dairy Whole or 2% milk, cream, and half-and-half. Whole or full-fat cream cheese. Whole-fat or sweetened yogurt. Full-fat cheese. Nondairy creamers. Whipped toppings.  Processed cheese and cheese spreads. Fats and oils Butter. Stick margarine. Lard. Shortening. Ghee. Bacon fat. Tropical oils, such as coconut, palm kernel, or palm oil. Seasoning and other foods Salted popcorn and pretzels. Onion salt, garlic salt, seasoned salt, table salt, and sea salt. Worcestershire sauce. Tartar sauce. Barbecue sauce. Teriyaki sauce. Soy sauce, including reduced-sodium. Steak sauce. Canned and packaged gravies. Fish sauce. Oyster sauce. Cocktail sauce. Horseradish that you find on the shelf. Ketchup. Mustard. Meat flavorings and tenderizers. Bouillon cubes. Hot sauce and Tabasco sauce. Premade or packaged marinades. Premade or packaged taco seasonings. Relishes. Regular salad dressings. Where to find more information:  National Heart, Lung, and Blood Institute: www.nhlbi.nih.gov  American Heart Association: www.heart.org Summary  The DASH eating plan is a healthy eating plan that has been shown to reduce high blood pressure (hypertension). It may also reduce your risk for type 2 diabetes, heart disease, and stroke.  With the DASH eating plan, you should limit salt (sodium) intake to 2,300 mg a day. If you have hypertension, you may need to reduce your sodium intake to 1,500 mg a day.  When on the DASH eating plan, aim to eat more fresh fruits and vegetables, whole grains, lean proteins, low-fat dairy, and heart-healthy fats.  Work with your health care provider or diet and nutrition specialist (dietitian) to adjust your eating plan to your   individual calorie needs. This information is not intended to replace advice given to you by your health care provider. Make sure you discuss any questions you have with your health care provider. Document Revised: 03/30/2017 Document Reviewed: 04/10/2016 Elsevier Patient Education  2020 Elsevier Inc.  

## 2019-08-23 ENCOUNTER — Ambulatory Visit: Payer: Self-pay | Attending: Internal Medicine

## 2019-08-23 DIAGNOSIS — Z23 Encounter for immunization: Secondary | ICD-10-CM

## 2019-08-23 NOTE — Progress Notes (Signed)
   Covid-19 Vaccination Clinic  Name:  Christopher Harrell    MRN: [id number] DOB: [birth year]  08/23/2019  Mr. Christopher Harrell was observed post Covid-19 immunization for 15 minutes without incident. He was provided with Vaccine Information Sheet and instruction to access the V-Safe system.   Mr. Christopher Harrell was instructed to call 911 with any severe reactions post vaccine: Marland Kitchen Difficulty breathing  . Swelling of face and throat  . A fast heartbeat  . A bad rash all over body  . Dizziness and weakness   Immunizations Administered    Name Date Dose VIS Date Route   Pfizer COVID-19 Vaccine 08/23/2019  9:42 AM 0.3 mL 06/25/2018 Intramuscular   Manufacturer: [employer], [employer]   Lot: [id number]   [place]: [id number]

## 2019-09-08 ENCOUNTER — Other Ambulatory Visit: Payer: Self-pay | Admitting: Urology

## 2019-10-01 ENCOUNTER — Other Ambulatory Visit: Payer: Self-pay

## 2019-10-08 ENCOUNTER — Ambulatory Visit: Payer: Self-pay | Admitting: Gerontology

## 2019-10-08 ENCOUNTER — Other Ambulatory Visit: Payer: Self-pay

## 2019-10-08 DIAGNOSIS — E781 Pure hyperglyceridemia: Secondary | ICD-10-CM

## 2019-10-08 DIAGNOSIS — R109 Unspecified abdominal pain: Secondary | ICD-10-CM

## 2019-10-09 LAB — LIPID PANEL
Chol/HDL Ratio: 6.1 ratio — ABNORMAL HIGH (ref 0.0–5.0)
Cholesterol, Total: 239 mg/dL — ABNORMAL HIGH (ref 100–199)
HDL: 39 mg/dL — ABNORMAL LOW (ref >39)
LDL Chol Calc (NIH): 135 mg/dL — ABNORMAL HIGH (ref 0–99)
Triglycerides: 360 mg/dL — ABNORMAL HIGH (ref 0–149)
VLDL Cholesterol Cal: 65 mg/dL — ABNORMAL HIGH (ref 5–40)

## 2019-10-09 LAB — UA/M W/RFLX CULTURE, ROUTINE
Bilirubin, UA: NEGATIVE
Glucose, UA: NEGATIVE
Ketones, UA: NEGATIVE
Leukocytes,UA: NEGATIVE
Nitrite, UA: NEGATIVE
Protein,UA: NEGATIVE
RBC, UA: NEGATIVE
Specific Gravity, UA: 1.02 (ref 1.005–1.030)
Urobilinogen, Ur: 0.2 mg/dL (ref 0.2–1.0)
pH, UA: 5 (ref 5.0–7.5)

## 2019-10-09 LAB — MICROSCOPIC EXAMINATION
Bacteria, UA: NONE SEEN
Casts: NONE SEEN /lpf
Epithelial Cells (non renal): NONE SEEN /hpf (ref 0–10)
RBC, Urine: NONE SEEN /hpf (ref 0–2)
WBC, UA: NONE SEEN /hpf (ref 0–5)

## 2019-10-10 ENCOUNTER — Other Ambulatory Visit: Payer: Self-pay | Admitting: Urology

## 2019-10-16 ENCOUNTER — Telehealth: Payer: Self-pay | Admitting: Gerontology

## 2019-10-16 ENCOUNTER — Ambulatory Visit: Payer: Self-pay | Admitting: Gerontology

## 2019-10-16 NOTE — Telephone Encounter (Signed)
Pt was made aware of missed appt and 5$ no show fee. FU was rescheduled to a later date.

## 2019-10-27 NOTE — Progress Notes (Signed)
Letter  

## 2019-10-28 ENCOUNTER — Ambulatory Visit: Payer: Self-pay | Admitting: Gerontology

## 2019-10-29 ENCOUNTER — Telehealth: Payer: Self-pay | Admitting: General Practice

## 2019-10-29 NOTE — Telephone Encounter (Addendum)
A call was made earlier in the day so I called now to see if it was a better time... still no answer so left a message on his VM to reschedule  ----- Message from Langston Reusing, NP sent at 10/29/2019 12:38 AM EDT ----- Mr Schwandt no showed to his appointment, pls reschedule, notify with date and time, and make telephone note. Thank you

## 2019-10-29 NOTE — Telephone Encounter (Signed)
Pt no showed 6/29 appt. Attempted to call, but LVM regarding r/s appt.

## 2019-11-01 ENCOUNTER — Other Ambulatory Visit: Payer: Self-pay | Admitting: Gerontology

## 2019-11-01 DIAGNOSIS — I10 Essential (primary) hypertension: Secondary | ICD-10-CM

## 2019-11-01 IMAGING — CT CT ANGIO CHEST-ABD-PELV FOR DISSECTION W/ AND WO/W CM
2 of 7 series · 15 of 46 positions shown, 17 images · IV contrast (APPLIED)
Comparison: None.

CLINICAL DATA: Aortic disease.  Right flank pain.

EXAM:
CT ANGIOGRAPHY CHEST, ABDOMEN AND PELVIS
TECHNIQUE: Multidetector CT imaging through the chest, abdomen and pelvis was
performed using the standard protocol during bolus administration of
intravenous contrast. Multiplanar reconstructed images and MIPs were
obtained and reviewed to evaluate the vascular anatomy.
CONTRAST:  100mL BOW7XH-P4W IOPAMIDOL ([id number]) INJECTION 76%

[Series 6: axial arterial · axial · arterial · 0.71mm/px · z∈[-694,-124]mm · 12 of 220 slices shown, 14 images]
[im 15/220  soft-tissue]
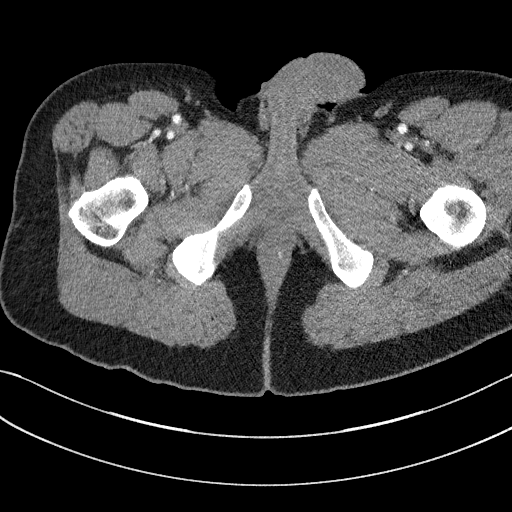
[im 15/220  bone]
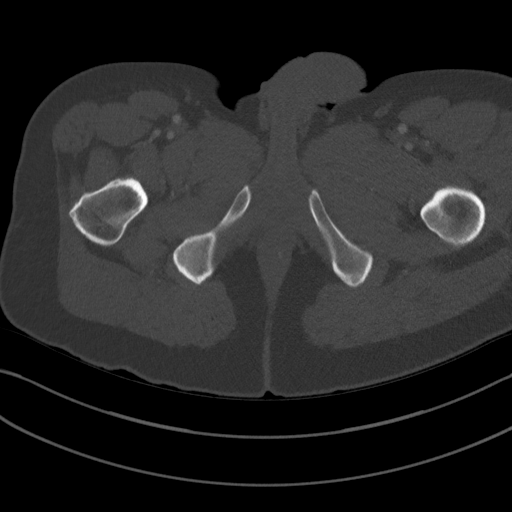
[im 30/220  soft-tissue]
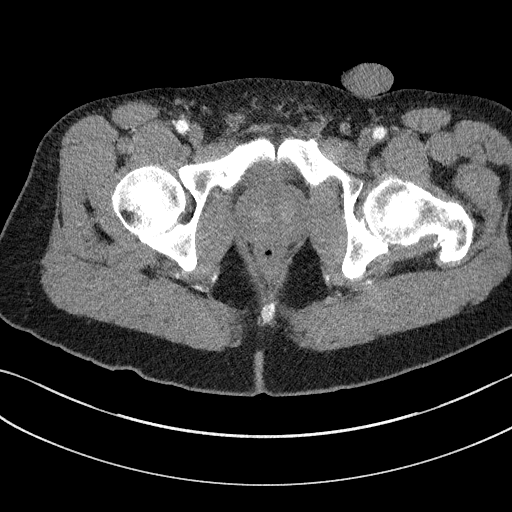
[im 44/220  soft-tissue]
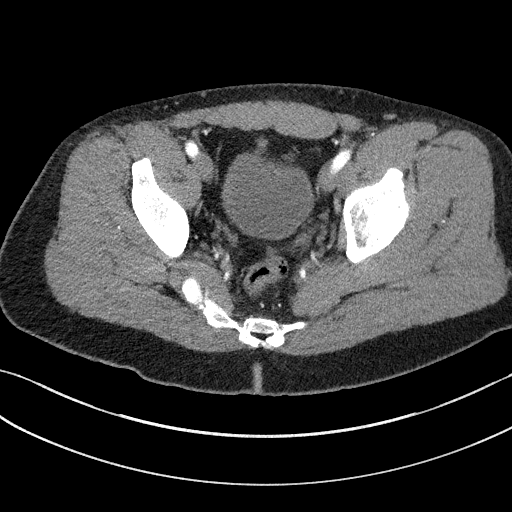
[im 74/220  soft-tissue]
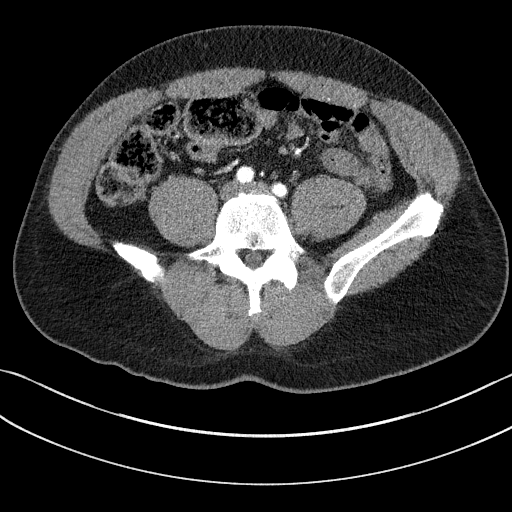
[im 88/220  soft-tissue]
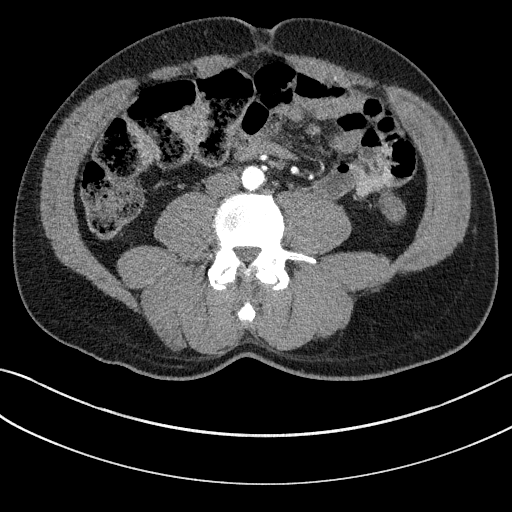
[im 103/220  soft-tissue]
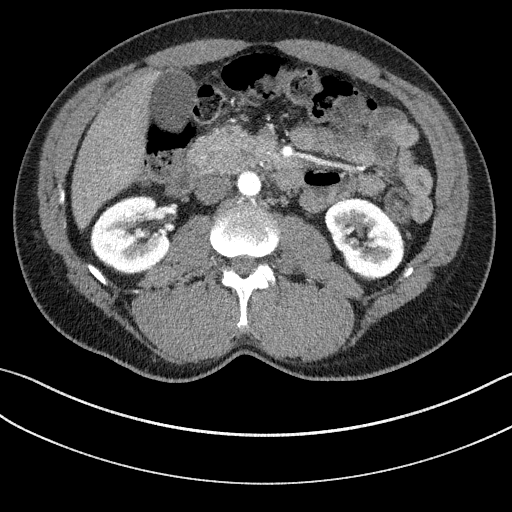
[im 117/220  soft-tissue]
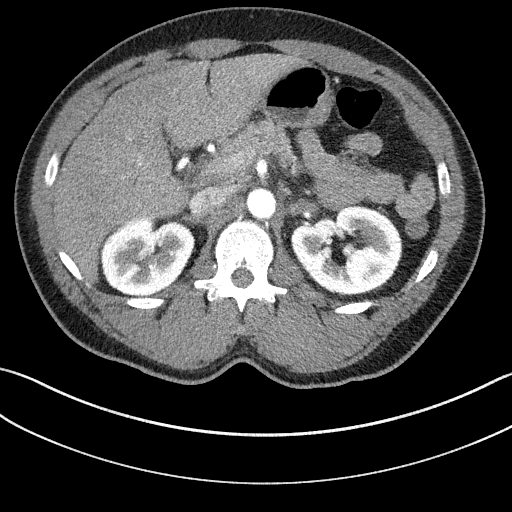
[im 132/220  soft-tissue]
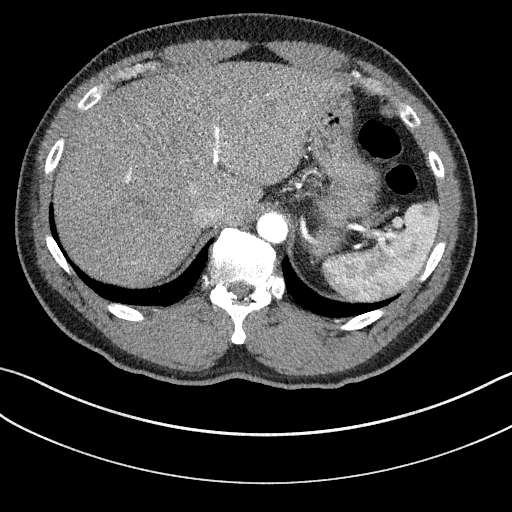
[im 147/220  soft-tissue]
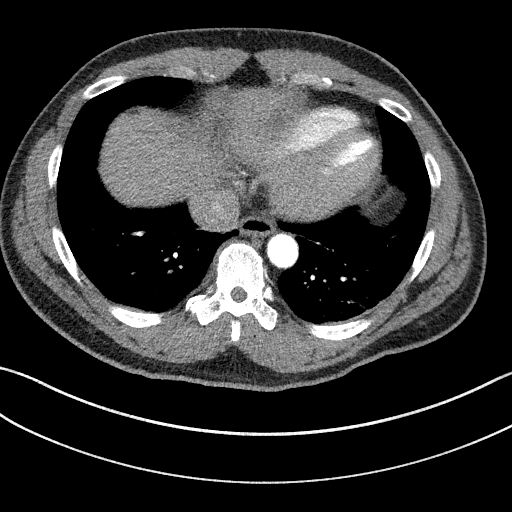
[im 147/220  bone]
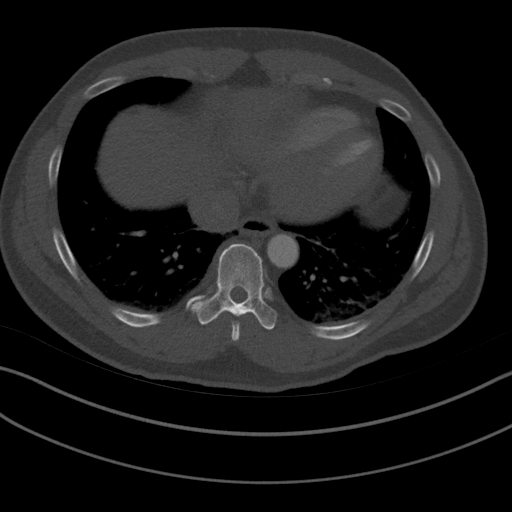
[im 176/220  soft-tissue]
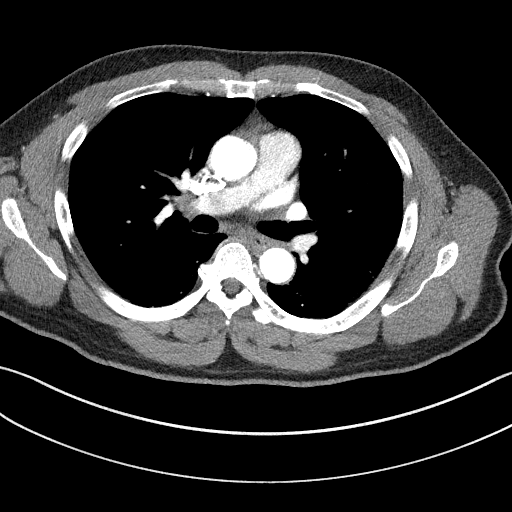
[im 190/220  soft-tissue]
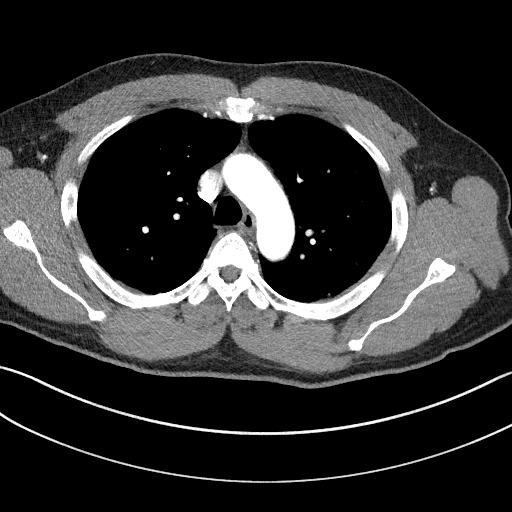
[im 205/220  soft-tissue]
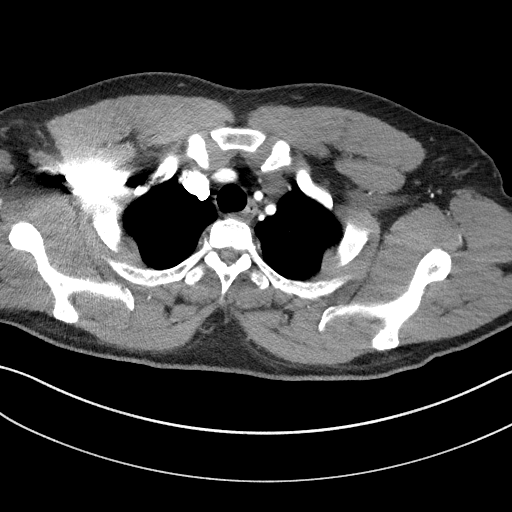

[Series 9: coronals · coronal · 0.82mm/px · 3 of 132 slices shown]
[im 33/132  soft-tissue]
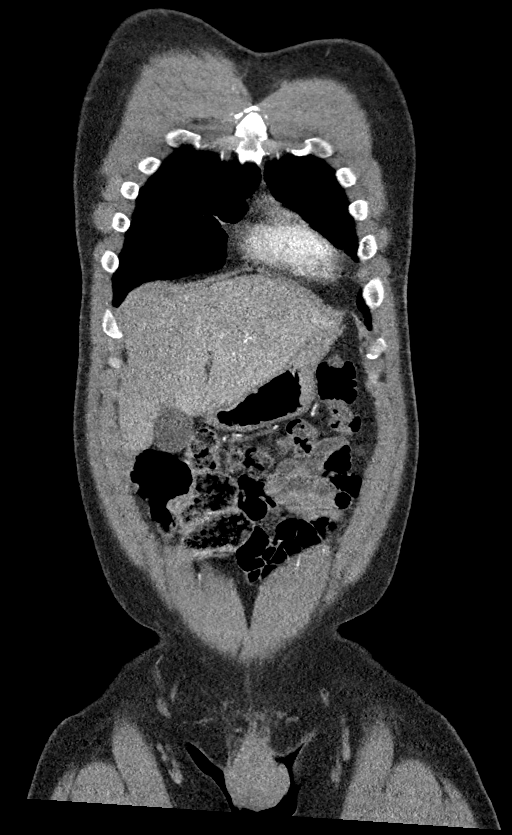
[im 66/132  soft-tissue]
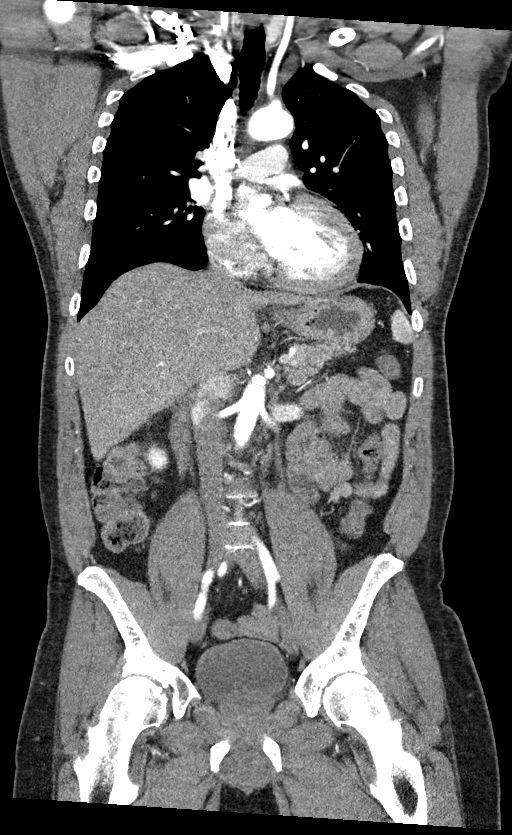
[im 99/132  soft-tissue]
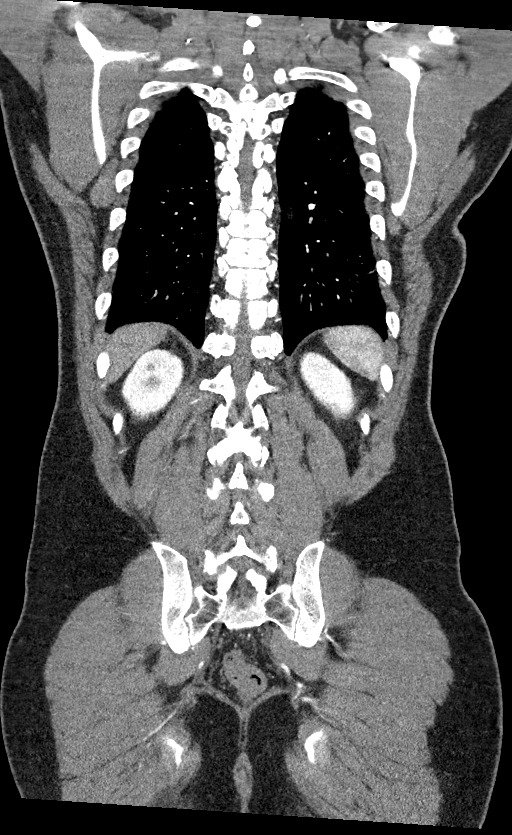

[15 of 46 positions shown; findings below may reference images not displayed]

FINDINGS: CTA CHEST FINDINGS

Cardiovascular: Heart is normal size. Aorta is normal caliber. No
evidence of dissection. No visible central pulmonary embolus.

Mediastinum/Nodes: No mediastinal, hilar, or axillary adenopathy.
Trachea and esophagus are unremarkable.

Lungs/Pleura: Dependent atelectasis. No confluent opacities
otherwise. No effusions.

Musculoskeletal: Chest wall soft tissues are unremarkable. No acute
bony abnormality.

Review of the MIP images confirms the above findings.

CTA ABDOMEN AND PELVIS FINDINGS

VASCULAR

Aorta: Normal size.  No aneurysm or dissection.

Celiac: Widely patent

SMA: Runny patent

Renals: Single bilaterally, widely patent

IMA: Widely patent

Inflow: No aneurysm or dissection.

Veins: Grossly unremarkable.

Review of the MIP images confirms the above findings.

NON-VASCULAR

Hepatobiliary: No focal hepatic abnormality. Gallbladder
unremarkable.

Pancreas: No focal abnormality or ductal dilatation.

Spleen: No focal abnormality.  Normal size.

Adrenals/Urinary Tract: No adrenal abnormality. No focal renal
abnormality. No stones or hydronephrosis. Urinary bladder is
unremarkable.

Stomach/Bowel: Normal appendix. Moderate stool burden in the colon.
Stomach, large and small bowel grossly unremarkable.

Lymphatic: No adenopathy.

Reproductive: Is mildly prominent prostate.

Other: No free fluid or free air.

Musculoskeletal: No acute bony abnormality.

Review of the MIP images confirms the above findings.
IMPRESSION: No evidence of aortic aneurysm or dissection.

Minimal dependent atelectasis in the lower lobes.

No acute findings in the abdomen or pelvis.

Moderate stool burden.

Mildly prominent prostate.

## 2019-11-04 ENCOUNTER — Telehealth: Payer: Self-pay | Admitting: General Practice

## 2019-11-04 NOTE — Telephone Encounter (Addendum)
Rescheduled him for night clinic- now works during the day at a new job in [place]  ----- Message from Langston Reusing, NP sent at 10/29/2019 12:38 AM EDT ----- Mr Crompton no showed to his appointment, pls reschedule, notify with date and time, and make telephone note. Thank you

## 2019-11-06 ENCOUNTER — Ambulatory Visit: Payer: Self-pay | Admitting: Gerontology

## 2019-11-06 ENCOUNTER — Other Ambulatory Visit: Payer: Self-pay

## 2019-11-06 VITALS — BP 136/94 | HR 83 | Ht 69.0 in | Wt 224.0 lb

## 2019-11-06 DIAGNOSIS — G8929 Other chronic pain: Secondary | ICD-10-CM

## 2019-11-06 DIAGNOSIS — M545 Low back pain, unspecified: Secondary | ICD-10-CM

## 2019-11-06 DIAGNOSIS — E119 Type 2 diabetes mellitus without complications: Secondary | ICD-10-CM

## 2019-11-06 DIAGNOSIS — E781 Pure hyperglyceridemia: Secondary | ICD-10-CM

## 2019-11-06 DIAGNOSIS — I10 Essential (primary) hypertension: Secondary | ICD-10-CM

## 2019-11-06 LAB — POCT GLYCOSYLATED HEMOGLOBIN (HGB A1C): Hemoglobin A1C: 6.5 % — AB (ref 4.0–5.6)

## 2019-11-06 MED ORDER — ATORVASTATIN CALCIUM 10 MG PO TABS: 10.0000 mg | ORAL_TABLET | Freq: Every day | ORAL | 1 refills | Status: DC

## 2019-11-06 MED ORDER — METFORMIN HCL 500 MG PO TABS: 500.0000 mg | ORAL_TABLET | Freq: Every day | ORAL | 0 refills | Status: DC

## 2019-11-06 NOTE — Progress Notes (Addendum)
Established Patient Office Visit  Subjective:  Patient ID: Christopher Harrell, male    DOB: 1971-02-25  Age: 49 y.o. MRN: [id number]  CC:  Chief Complaint  Patient presents with  . Back Pain    as well as ankle pain  . lab follow up    HPI Christopher Harrell presents for follow up of labs review, and complained of  back and ankle pain.States His chronic back pain flared up 3 weeks ago when he was standing at work.  He describes pain as sharp non radiating 8/10. States that standing at work aggravates pain. He denies bladder and bowel incontinent. Denies, falls or injury. Currently he complains of polydipsia, polyuria, and polyphagia. Denines pelvic pain or fever. He states that he has a family history of diabetes. 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  Over all he is doing well and offers no further complaints.   Past Medical History:  Diagnosis Date  . Community acquired pneumonia    a. Dx 01/21/2016 - LLL PNA.  . Essential hypertension   . Hyperlipidemia   . Tobacco abuse     Past Surgical History:  Procedure Laterality Date  . NO PAST SURGERIES      Family History  Problem Relation Age of Onset  . Hypertension Mother   . Hypertension Father   . Heart attack Maternal Grandmother     Social History   Socioeconomic History  . Marital status: Legally Separated    Spouse name: Not on file  . Number of children: Not on file  . Years of education: Not on file  . Highest education level: Not on file  Occupational History  . Not on file  Tobacco Use  . Smoking status: Current Every Day Smoker    Packs/day: 0.50    Years: 20.00    Pack years: 10.00    Types: Cigarettes  . Smokeless tobacco: Never Used  Vaping Use  . Vaping Use: Never used  Substance and Sexual Activity  . Alcohol use: Not Currently  . Drug use: No  . Sexual activity: Yes    Birth control/protection: None  Other Topics Concern  . Not on file  Social History Narrative   No longer  living with wife. Walking for 20 mins per day   Social Determinants of Health   Financial Resource Strain: Low Risk   . Difficulty of Paying Living Expenses: Not hard at all  Food Insecurity: No Food Insecurity  . Worried About Charity fundraiser in the Last Year: Never true  . Ran Out of Food in the Last Year: Never true  Transportation Needs:   . Lack of Transportation (Medical):   Marland Kitchen Lack of Transportation (Non-Medical):   Physical Activity: Insufficiently Active  . Days of Exercise per Week: 5 days  . Minutes of Exercise per Session: 20 min  Stress: No Stress Concern Present  . Feeling of Stress : Not at all  Social Connections: Moderately Integrated  . Frequency of Communication with Friends and Family: More than three times a week  . Frequency of Social Gatherings with Friends and Family: More than three times a week  . Attends Religious Services: More than 4 times per year  . Active Member of Clubs or Organizations: Yes  . Attends Archivist Meetings: 1 to 4 times per year  . Marital Status: Separated  Intimate Partner Violence: Not At Risk  . Fear of Current or Ex-Partner: No  . Emotionally Abused: No  .  Physically Abused: No  . Sexually Abused: No    Outpatient Medications Prior to Visit  Medication Sig Dispense Refill  . albuterol (PROVENTIL HFA;VENTOLIN HFA) 108 (90 Base) MCG/ACT inhaler Inhale 2 puffs into the lungs every 6 (six) hours as needed for wheezing or shortness of breath. 1 Inhaler 3  . amLODipine (NORVASC) 10 MG tablet Take 1 tablet (10 mg total) by mouth daily. 30 tablet 2  . atenolol (TENORMIN) 50 MG tablet Take 1 tablet (50 mg total) by mouth daily. 30 tablet 2  . doxazosin (CARDURA) 4 MG tablet Take 1 tablet (4 mg total) by mouth daily. 30 tablet 1  . Multiple Vitamin (MULTIVITAMIN WITH MINERALS) TABS tablet Take 1 tablet by mouth daily.    Marland Kitchen triamterene-hydrochlorothiazide (DYAZIDE) 37.5-25 MG capsule Take 1 each (1 capsule total) by mouth  daily. 30 capsule 2  . atorvastatin (LIPITOR) 10 MG tablet TAKE 1 TABLET BY MOUTH EVERY DAY 30 tablet 1  . aspirin EC 81 MG tablet Take 1 tablet (81 mg total) by mouth daily. (Patient not taking: Reported on 11/06/2019) 30 tablet 3   No facility-administered medications prior to visit.    No Known Allergies  ROS Review of Systems Constitutional: No fever/chills Cardiovascular: Negative.   Endocrine: positive for polydipsia, polyuria, and polyphagia Respiratory: Negative.   Neurological: numbness to 4th and 5th metatarsal Musculoskeletal:  Back pain Psychiatric/Behavioral: Negative.   Skin: Negative for rash.       Objective:    Physical Exam Constitutional:      Appearance: Normal appearance.  HENT:     Head: Normocephalic.  Cardiovascular:     Rate and Rhythm: Normal rate and regular rhythm.     Pulses: Normal pulses.     Heart sounds: Normal heart sounds.  Pulmonary:     Effort: Pulmonary effort is normal.     Breath sounds: Normal breath sounds.  Musculoskeletal:        General: Tenderness (lower back) present.     Lumbar back: Tenderness present.  Skin:    General: Skin is warm and dry.  Neurological:     General: No focal deficit present.     Mental Status: He is alert and oriented to person, place, and time. Mental status is at baseline.     Sensory: Sensory deficit present.     Motor: Motor function is intact.     Gait: Gait is intact.     Comments: Abnormal monofilament test to right 4th and 5th metatarsal  Psychiatric:        Mood and Affect: Mood normal.        Behavior: Behavior normal.     BP (!) 136/94 (BP Location: Right Arm, Patient Position: Sitting)   Pulse 83   Ht 5' 9"  (1.753 m)   Wt 224 lb (101.6 kg)   SpO2 94%   BMI 33.08 kg/m  Wt Readings from Last 3 Encounters:  11/06/19 224 lb (101.6 kg)  08/13/19 227 lb 12.8 oz (103.3 kg)  07/16/19 224 lb (101.6 kg)     Health Maintenance Due  Topic Date Due  . Hepatitis C Screening   Never done  . TETANUS/TDAP  Never done  . COLONOSCOPY  Never done    There are no preventive care reminders to display for this patient.  Lab Results  Component Value Date   TSH 1.110 06/13/2016   Lab Results  Component Value Date   WBC 6.6 06/25/2019   HGB 16.3 06/25/2019   HCT 45.3  06/25/2019   MCV 88 06/25/2019   PLT 260 06/25/2019   Lab Results  Component Value Date   NA 142 06/25/2019   K 4.5 06/25/2019   CO2 23 06/25/2019   GLUCOSE 100 (H) 06/25/2019   BUN 12 06/25/2019   CREATININE 0.87 06/25/2019   BILITOT 0.5 06/25/2019   ALKPHOS 66 06/25/2019   AST 28 06/25/2019   ALT 35 06/25/2019   PROT 7.3 06/25/2019   ALBUMIN 4.6 06/25/2019   CALCIUM 9.9 06/25/2019   ANIONGAP 12 04/18/2019   Lab Results  Component Value Date   CHOL 239 (H) 10/08/2019   Lab Results  Component Value Date   HDL 39 (L) 10/08/2019   Lab Results  Component Value Date   LDLCALC 135 (H) 10/08/2019   Lab Results  Component Value Date   TRIG 360 (H) 10/08/2019   Lab Results  Component Value Date   CHOLHDL 6.1 (H) 10/08/2019   Lab Results  Component Value Date   HGBA1C 6.5 (A) 11/06/2019      Assessment & Plan:   1. Essential hypertension Continue blood pressure medications as directed, Advised to continue DASH diet. Advise to monitor blood pressure and to keep log.  2. Hypertriglyceridemia Continue  atorvastatin (LIPITOR) 10 MG tablet; Take 1 tablet (10 mg total) by mouth daily.  Dispense: 30 tablet; Refill: 1 He was advised to continue on low fat/low cholesterol diet and exercise as tolerated.  The 10-year ASCVD risk score Mikey Bussing DC Brooke Bonito., et al., 2013) is: 32.1%   Values used to calculate the score:     Age: 23 years     Sex: Male     Is Non-Hispanic African American: Yes     Diabetic: Yes     Tobacco smoker: Yes     Systolic Blood Pressure: 947 mmHg     Is BP treated: Yes     HDL Cholesterol: 39 mg/dL     Total Cholesterol: 239 mg/dL   3. Diabetes mellitus without  complication ([place]) Advise to continue on low carb/low concentrated sweets die - POCT HgB A1C 6.5% - Comp Met (CMET) - Start taking metFORMIN (GLUCOPHAGE) 500 MG tablet; Take 1 tablet (500 mg total) by mouth daily.  Dispense: 30 tablet; Refill: 0   4. Chronic low back pain without sciatica, unspecified back pain laterality NSAID- take medication as ordered with food Loss weight Stay active. Maintain normal level of activities May perform gentle stretching exercise as tolerated when your pain is improved May wear a brace or ace wrap for support Return to clinic if symptoms persist, new or worsen Follow up with Dr. Vickki Hearing   Follow-up: Return in about 4 weeks (around 12/04/2019), or if symptoms worsen or fail to improve.    Wolfgang Phoenix, NP

## 2019-11-06 NOTE — Patient Instructions (Addendum)
Diabetes Basics  Diabetes (diabetes mellitus) is a long-term (chronic) disease. It occurs when the body does not properly use sugar (glucose) that is released from food after you eat. Diabetes may be caused by one or both of these problems:  Your pancreas does not make enough of a hormone called insulin.  Your body does not react in a normal way to insulin that it makes. Insulin lets sugars (glucose) go into cells in your body. This gives you energy. If you have diabetes, sugars cannot get into cells. This causes high blood sugar (hyperglycemia). Follow these instructions at home: How is diabetes treated? You may need to take insulin or other diabetes medicines daily to keep your blood sugar in balance. Take your diabetes medicines every day as told by your doctor. List your diabetes medicines here: Diabetes medicines  Name of medicine: ______________________________ ? Amount (dose): _______________ Time (a.m./p.m.): _______________ Notes: ___________________________________  Name of medicine: ______________________________ ? Amount (dose): _______________ Time (a.m./p.m.): _______________ Notes: ___________________________________  Name of medicine: ______________________________ ? Amount (dose): _______________ Time (a.m./p.m.): _______________ Notes: ___________________________________ If you use insulin, you will learn how to give yourself insulin by injection. You may need to adjust the amount based on the food that you eat. List the types of insulin you use here: Insulin  Insulin type: ______________________________ ? Amount (dose): _______________ Time (a.m./p.m.): _______________ Notes: ___________________________________  Insulin type: ______________________________ ? Amount (dose): _______________ Time (a.m./p.m.): _______________ Notes: ___________________________________  Insulin type: ______________________________ ? Amount (dose): _______________ Time (a.m./p.m.):  _______________ Notes: ___________________________________  Insulin type: ______________________________ ? Amount (dose): _______________ Time (a.m./p.m.): _______________ Notes: ___________________________________  Insulin type: ______________________________ ? Amount (dose): _______________ Time (a.m./p.m.): _______________ Notes: ___________________________________ How do I manage my blood sugar?  Check your blood sugar levels using a blood glucose monitor as directed by your doctor. Your doctor will set treatment goals for you. Generally, you should have these blood sugar levels:  Before meals (preprandial): 80-130 mg/dL (4.4-7.2 mmol/L).  After meals (postprandial): below 180 mg/dL (10 mmol/L).  A1c level: less than 7%. Write down the times that you will check your blood sugar levels: Blood sugar checks  Time: _______________ Notes: ___________________________________  Time: _______________ Notes: ___________________________________  Time: _______________ Notes: ___________________________________  Time: _______________ Notes: ___________________________________  Time: _______________ Notes: ___________________________________  Time: _______________ Notes: ___________________________________  What do I need to know about low blood sugar? Low blood sugar is called hypoglycemia. This is when blood sugar is at or below 70 mg/dL (3.9 mmol/L). Symptoms may include:  Feeling: ? Hungry. ? Worried or nervous (anxious). ? Sweaty and clammy. ? Confused. ? Dizzy. ? Sleepy. ? Sick to your stomach (nauseous).  Having: ? A fast heartbeat. ? A headache. ? A change in your vision. ? Tingling or no feeling (numbness) around the mouth, lips, or tongue. ? Jerky movements that you cannot control (seizure).  Having trouble with: ? Moving (coordination). ? Sleeping. ? Passing out (fainting). ? Getting upset easily (irritability). Treating low blood sugar To treat low blood  sugar, eat or drink something sugary right away. If you can think clearly and swallow safely, follow the 15:15 rule:  Take 15 grams of a fast-acting carb (carbohydrate). Talk with your doctor about how much you should take.  Some fast-acting carbs are: ? Sugar tablets (glucose pills). Take 3-4 glucose pills. ? 6-8 pieces of hard candy. ? 4-6 oz (120-150 mL) of fruit juice. ? 4-6 oz (120-150 mL) of regular (not diet) soda. ? 1 Tbsp (15 mL) honey or sugar.  Check your blood sugar 15 minutes after you take the carb.  If your blood sugar is still at or below 70 mg/dL (3.9 mmol/L), take 15 grams of a carb again.  If your blood sugar does not go above 70 mg/dL (3.9 mmol/L) after 3 tries, get help right away.  After your blood sugar goes back to normal, eat a meal or a snack within 1 hour. Treating very low blood sugar If your blood sugar is at or below 54 mg/dL (3 mmol/L), you have very low blood sugar (severe hypoglycemia). This is an emergency. Do not wait to see if the symptoms will go away. Get medical help right away. Call your local emergency services (911 in the [place] Do not drive yourself to the hospital. Questions to ask your health care provider  Do I need to meet with a diabetes educator?  What equipment will I need to care for myself at home?  What diabetes medicines do I need? When should I take them?  How often do I need to check my blood sugar?  What number can I call if I have questions?  When is my next doctor's visit?  Where can I find a support group for people with diabetes? Where to find more information  American Diabetes Association: www.diabetes.org  American Association of Diabetes Educators: www.diabeteseducator.org/patient-resources Contact a doctor if:  Your blood sugar is at or above 240 mg/dL (13.3 mmol/L) for 2 days in a row.  You have been sick or have had a fever for 2 days or more, and you are not getting better.  You have any of these  problems for more than 6 hours: ? You cannot eat or drink. ? You feel sick to your stomach (nauseous). ? You throw up (vomit). ? You have watery poop (diarrhea). Get help right away if:  Your blood sugar is lower than 54 mg/dL (3 mmol/L).  You get confused.  You have trouble: ? Thinking clearly. ? Breathing. Summary  Diabetes (diabetes mellitus) is a long-term (chronic) disease. It occurs when the body does not properly use sugar (glucose) that is released from food after digestion.  Take insulin and diabetes medicines as told.  Check your blood sugar every day, as often as told.  Keep all follow-up visits as told by your doctor. This is important. This information is not intended to replace advice given to you by your health care provider. Make sure you discuss any questions you have with your health care provider. Document Revised: 01/08/2019 Document Reviewed: 07/20/2017 Elsevier Patient Education  [place] DASH stands for "Dietary Approaches to Stop Hypertension." The DASH eating plan is a healthy eating plan that has been shown to reduce high blood pressure (hypertension). It may also reduce your risk for type 2 diabetes, heart disease, and stroke. The DASH eating plan may also help with weight loss. What are tips for following this plan?  General guidelines  Avoid eating more than 2,300 mg (milligrams) of salt (sodium) a day. If you have hypertension, you may need to reduce your sodium intake to 1,500 mg a day.  Limit alcohol intake to no more than 1 drink a day for nonpregnant women and 2 drinks a day for men. One drink equals 12 oz of beer, 5 oz of wine, or 1 oz of hard liquor.  Work with your health care provider to maintain a healthy body weight or to lose weight. Ask what an ideal weight is for you.  Get at least 30 minutes of exercise that causes your heart to beat faster (aerobic exercise) most days of the week. Activities may include  walking, swimming, or biking.  Work with your health care provider or diet and nutrition specialist (dietitian) to adjust your eating plan to your individual calorie needs. Reading food labels   Check food labels for the amount of sodium per serving. Choose foods with less than 5 percent of the Daily Value of sodium. Generally, foods with less than 300 mg of sodium per serving fit into this eating plan.  To find whole grains, look for the word "whole" as the first word in the ingredient list. Shopping  Buy products labeled as "low-sodium" or "no salt added."  Buy fresh foods. Avoid canned foods and premade or frozen meals. Cooking  Avoid adding salt when cooking. Use salt-free seasonings or herbs instead of table salt or sea salt. Check with your health care provider or pharmacist before using salt substitutes.  Do not fry foods. Cook foods using healthy methods such as baking, boiling, grilling, and broiling instead.  Cook with heart-healthy oils, such as olive, canola, soybean, or sunflower oil. Meal planning  Eat a balanced diet that includes: ? 5 or more servings of fruits and vegetables each day. At each meal, try to fill half of your plate with fruits and vegetables. ? Up to 6-8 servings of whole grains each day. ? Less than 6 oz of lean meat, poultry, or fish each day. A 3-oz serving of meat is about the same size as a deck of cards. One egg equals 1 oz. ? 2 servings of low-fat dairy each day. ? A serving of nuts, seeds, or beans 5 times each week. ? Heart-healthy fats. Healthy fats called Omega-3 fatty acids are found in foods such as flaxseeds and coldwater fish, like sardines, salmon, and mackerel.  Limit how much you eat of the following: ? Canned or prepackaged foods. ? Food that is high in trans fat, such as fried foods. ? Food that is high in saturated fat, such as fatty meat. ? Sweets, desserts, sugary drinks, and other foods with added sugar. ? Full-fat dairy  products.  Do not salt foods before eating.  Try to eat at least 2 vegetarian meals each week.  Eat more home-cooked food and less restaurant, buffet, and fast food.  When eating at a restaurant, ask that your food be prepared with less salt or no salt, if possible. What foods are recommended? The items listed may not be a complete list. Talk with your dietitian about what dietary choices are best for you. Grains Whole-grain or whole-wheat bread. Whole-grain or whole-wheat pasta. Brown rice. [place]. Bulgur. Whole-grain and low-sodium cereals. Pita bread. Low-fat, low-sodium crackers. Whole-wheat flour tortillas. Vegetables Fresh or frozen vegetables (raw, steamed, roasted, or grilled). Low-sodium or reduced-sodium tomato and vegetable juice. Low-sodium or reduced-sodium tomato sauce and tomato paste. Low-sodium or reduced-sodium canned vegetables. Fruits All fresh, dried, or frozen fruit. Canned fruit in natural juice (without added sugar). Meat and other protein foods Skinless chicken or [place]. Ground chicken or [place]. Pork with fat trimmed off. Fish and seafood. Egg whites. Dried beans, peas, or lentils. Unsalted nuts, nut butters, and seeds. Unsalted canned beans. Lean cuts of beef with fat trimmed off. Low-sodium, lean deli meat. Dairy Low-fat (1%) or fat-free (skim) milk. Fat-free, low-fat, or reduced-fat cheeses. Nonfat, low-sodium ricotta or cottage cheese. Low-fat or nonfat yogurt. Low-fat, low-sodium cheese. Fats and oils Soft margarine without  trans fats. Vegetable oil. Low-fat, reduced-fat, or light mayonnaise and salad dressings (reduced-sodium). Canola, safflower, olive, soybean, and sunflower oils. Avocado. Seasoning and other foods Herbs. Spices. Seasoning mixes without salt. Unsalted popcorn and pretzels. Fat-free sweets. What foods are not recommended? The items listed may not be a complete list. Talk with your dietitian about what dietary choices are best for  you. Grains Baked goods made with fat, such as croissants, muffins, or some breads. Dry pasta or rice meal packs. Vegetables Creamed or fried vegetables. Vegetables in a cheese sauce. Regular canned vegetables (not low-sodium or reduced-sodium). Regular canned tomato sauce and paste (not low-sodium or reduced-sodium). Regular tomato and vegetable juice (not low-sodium or reduced-sodium). Angie Fava. Olives. Fruits Canned fruit in a light or heavy syrup. Fried fruit. Fruit in cream or butter sauce. Meat and other protein foods Fatty cuts of meat. Ribs. Fried meat. Berniece Salines. Sausage. Bologna and other processed lunch meats. Salami. Fatback. Hotdogs. Bratwurst. Salted nuts and seeds. Canned beans with added salt. Canned or smoked fish. Whole eggs or egg yolks. Chicken or [place] with skin. Dairy Whole or 2% milk, cream, and half-and-half. Whole or full-fat cream cheese. Whole-fat or sweetened yogurt. Full-fat cheese. Nondairy creamers. Whipped toppings. Processed cheese and cheese spreads. Fats and oils Butter. Stick margarine. Lard. Shortening. Ghee. Bacon fat. Tropical oils, such as coconut, palm kernel, or palm oil. Seasoning and other foods Salted popcorn and pretzels. Onion salt, garlic salt, seasoned salt, table salt, and sea salt. Worcestershire sauce. Tartar sauce. Barbecue sauce. Teriyaki sauce. Soy sauce, including reduced-sodium. Steak sauce. Canned and packaged gravies. Fish sauce. Oyster sauce. Cocktail sauce. Horseradish that you find on the shelf. Ketchup. Mustard. Meat flavorings and tenderizers. Bouillon cubes. Hot sauce and Tabasco sauce. Premade or packaged marinades. Premade or packaged taco seasonings. Relishes. Regular salad dressings. Where to find more information:  National Heart, Lung, and [place]: https://wilson-eaton.com/  American Heart Association: www.heart.org Summary  The DASH eating plan is a healthy eating plan that has been shown to reduce high blood pressure  (hypertension). It may also reduce your risk for type 2 diabetes, heart disease, and stroke.  With the DASH eating plan, you should limit salt (sodium) intake to 2,300 mg a day. If you have hypertension, you may need to reduce your sodium intake to 1,500 mg a day.  When on the DASH eating plan, aim to eat more fresh fruits and vegetables, whole grains, lean proteins, low-fat dairy, and heart-healthy fats.  Work with your health care provider or diet and nutrition specialist (dietitian) to adjust your eating plan to your individual calorie needs. This information is not intended to replace advice given to you by your health care provider. Make sure you discuss any questions you have with your health care provider. Document Revised: 03/30/2017 Document Reviewed: 04/10/2016 Elsevier Patient Education  [place].  Acute Back Pain, Adult Acute back pain is sudden and usually short-lived. It is often caused by an injury to the muscles and tissues in the back. The injury may result from:  A muscle or ligament getting overstretched or torn (strained). Ligaments are tissues that connect bones to each other. Lifting something improperly can cause a back strain.  Wear and tear (degeneration) of the spinal disks. Spinal disks are circular tissue that provides cushioning between the bones of the spine (vertebrae).  Twisting motions, such as while playing sports or doing yard work.  A hit to the back.  Arthritis. You may have a physical exam, lab tests, and  imaging tests to find the cause of your pain. Acute back pain usually goes away with rest and home care. Follow these instructions at home: Managing pain, stiffness, and swelling  Take over-the-counter and prescription medicines only as told by your health care provider.  Your health care provider may recommend applying ice during the first 24-48 hours after your pain starts. To do this: ? Put ice in a plastic bag. ? Place a towel  between your skin and the bag. ? Leave the ice on for 20 minutes, 2-3 times a day.  If directed, apply heat to the affected area as often as told by your health care provider. Use the heat source that your health care provider recommends, such as a moist heat pack or a heating pad. ? Place a towel between your skin and the heat source. ? Leave the heat on for 20-30 minutes. ? Remove the heat if your skin turns bright red. This is especially important if you are unable to feel pain, heat, or cold. You have a greater risk of getting burned. Activity   Do not stay in bed. Staying in bed for more than 1-2 days can delay your recovery.  Sit up and stand up straight. Avoid leaning forward when you sit, or hunching over when you stand. ? If you work at a desk, sit close to it so you do not need to lean over. Keep your chin tucked in. Keep your neck drawn back, and keep your elbows bent at a right angle. Your arms should look like the letter "L." ? Sit high and close to the steering wheel when you drive. Add lower back (lumbar) support to your car seat, if needed.  Take short walks on even surfaces as soon as you are able. Try to increase the length of time you walk each day.  Do not sit, drive, or stand in one place for more than 30 minutes at a time. Sitting or standing for long periods of time can put stress on your back.  Do not drive or use heavy machinery while taking prescription pain medicine.  Use proper lifting techniques. When you bend and lift, use positions that put less stress on your back: ? [place] your knees. ? Keep the load close to your body. ? Avoid twisting.  Exercise regularly as told by your health care provider. Exercising helps your back heal faster and helps prevent back injuries by keeping muscles strong and flexible.  Work with a physical therapist to make a safe exercise program, as recommended by your health care provider. Do any exercises as told by your physical  therapist. Lifestyle  Maintain a healthy weight. Extra weight puts stress on your back and makes it difficult to have good posture.  Avoid activities or situations that make you feel anxious or stressed. Stress and anxiety increase muscle tension and can make back pain worse. Learn ways to manage anxiety and stress, such as through exercise. General instructions  Sleep on a firm mattress in a comfortable position. Try lying on your side with your knees slightly bent. If you lie on your back, put a pillow under your knees.  Follow your treatment plan as told by your health care provider. This may include: ? Cognitive or behavioral therapy. ? Acupuncture or massage therapy. ? Meditation or yoga. Contact a health care provider if:  You have pain that is not relieved with rest or medicine.  You have increasing pain going down into your legs or buttocks.  Your pain does not improve after 2 weeks.  You have pain at night.  You lose weight without trying.  You have a fever or chills. Get help right away if:  You develop new bowel or bladder control problems.  You have unusual weakness or numbness in your arms or legs.  You develop nausea or vomiting.  You develop abdominal pain.  You feel faint. Summary  Acute back pain is sudden and usually short-lived.  Use proper lifting techniques. When you bend and lift, use positions that put less stress on your back.  Take over-the-counter and prescription medicines and apply heat or ice as directed by your health care provider. This information is not intended to replace advice given to you by your health care provider. Make sure you discuss any questions you have with your health care provider. Document Revised: 08/06/2018 Document Reviewed: 11/29/2016 Elsevier Patient Education  [place].

## 2019-11-23 ENCOUNTER — Other Ambulatory Visit: Payer: Self-pay | Admitting: Gerontology

## 2019-11-23 DIAGNOSIS — I10 Essential (primary) hypertension: Secondary | ICD-10-CM

## 2019-12-04 ENCOUNTER — Other Ambulatory Visit: Payer: Self-pay

## 2019-12-09 ENCOUNTER — Ambulatory Visit: Payer: Self-pay | Admitting: Specialist

## 2019-12-09 ENCOUNTER — Other Ambulatory Visit: Payer: Self-pay

## 2019-12-09 DIAGNOSIS — G8929 Other chronic pain: Secondary | ICD-10-CM
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
## 2019-12-10 ENCOUNTER — Other Ambulatory Visit: Payer: Self-pay

## 2019-12-10 DIAGNOSIS — M545 Low back pain, unspecified: Secondary | ICD-10-CM

## 2019-12-10 DIAGNOSIS — G8929 Other chronic pain: Secondary | ICD-10-CM

## 2019-12-11 ENCOUNTER — Ambulatory Visit: Payer: Self-pay | Admitting: Family Medicine

## 2019-12-11 ENCOUNTER — Other Ambulatory Visit: Payer: Self-pay

## 2019-12-11 DIAGNOSIS — E119 Type 2 diabetes mellitus without complications: Secondary | ICD-10-CM

## 2019-12-11 MED ORDER — METFORMIN HCL 500 MG PO TABS: 500.0000 mg | ORAL_TABLET | Freq: Every day | ORAL | 0 refills | Status: DC

## 2019-12-11 NOTE — Progress Notes (Signed)
  [facility]   Progress Note: General Provider: Lanae Boast, FNP  SUBJECTIVE:   Christopher Harrell is a 49 y.o. male who  has a past medical history of Community acquired pneumonia, Essential hypertension, Hyperlipidemia, and Tobacco abuse.. Patient presents today for Medication Refill (Metformin--complains of stomach upset, dizziness; has been out for about a week) and Patient Education (Doesn't know how to use blood glucose meter)  Patient states that he has not been eating with taking medications. He states that he is having nausea due to this. He has not been checking his glucose levels due to not knowing how to use the machine. Patient states that since running out of his medication, he has been having intermittent dizziness. He denies chest pain, SOB, leg swelling or diarrhea.   Review of Systems  Constitutional: Negative.   HENT: Negative.   Eyes: Negative.   Respiratory: Negative.   Cardiovascular: Negative.   Gastrointestinal: Negative.   Genitourinary: Negative.   Musculoskeletal: Negative.   Skin: Negative.   Neurological: Positive for dizziness (intermittent x 1 week).  Psychiatric/Behavioral: Negative.      OBJECTIVE: BP 133/89 (BP Location: Left Arm, Patient Position: Sitting, Cuff Size: Large)   Pulse 81   Ht 5\' 9"  (1.753 m)   Wt 218 lb 3.2 oz (99 kg)   SpO2 95%   BMI 32.22 kg/m   Wt Readings from Last 3 Encounters:  12/11/19 218 lb 3.2 oz (99 kg)  11/06/19 224 lb (101.6 kg)  08/13/19 227 lb 12.8 oz (103.3 kg)     Physical Exam Vitals and nursing note reviewed.  Constitutional:      General: He is not in acute distress.    Appearance: Normal appearance.  HENT:     Head: Normocephalic and atraumatic.  Cardiovascular:     Rate and Rhythm: Normal rate.  Pulmonary:     Effort: Pulmonary effort is normal.  Neurological:     Mental Status: He is alert and oriented to person, place, and time.  Psychiatric:        Mood and Affect: Mood  normal.        Behavior: Behavior normal.        Thought Content: Thought content normal.        Judgment: Judgment normal.     ASSESSMENT/PLAN:   1. Diabetes mellitus without complication [facility]) Patient educated on use of glucometer and how often to test. He is not insulin dependent with an A1C of 6.5. Can test when feeling hypoglycemic. Discussed signs and symptoms of hyper and hypoglycemia.  - metFORMIN (GLUCOPHAGE) 500 MG tablet; Take 1 tablet (500 mg total) by mouth daily.  Dispense: 30 tablet; Refill: 0    Return in about 4 weeks (around 01/08/2020) for dm2.    The patient was given clear instructions to go to ER or return to medical center if symptoms do not improve, worsen or new problems develop. The patient verbalized understanding and agreed with plan of care.   Ms. Christopher Harrell. Christopher Canary, FNP-BC [place]

## 2019-12-11 NOTE — Patient Instructions (Signed)
Blood Glucose Monitoring, Adult Monitoring your blood sugar (glucose) is an important part of managing your diabetes (diabetes mellitus). Blood glucose monitoring involves checking your blood glucose as often as directed and keeping a record (log) of your results over time. Checking your blood glucose regularly and keeping a blood glucose log can:  Help you and your health care provider adjust your diabetes management plan as needed, including your medicines or insulin.  Help you understand how food, exercise, illnesses, and medicines affect your blood glucose.  Let you know what your blood glucose is at any time. You can quickly find out if you have low blood glucose (hypoglycemia) or high blood glucose (hyperglycemia). Your health care provider will set individualized treatment goals for you. Your goals will be based on your age, other medical conditions you have, and how you respond to diabetes treatment. Generally, the goal of treatment is to maintain the following blood glucose levels:  Before meals (preprandial): 80-130 mg/dL (4.4-7.2 mmol/L).  After meals (postprandial): below 180 mg/dL (10 mmol/L).  A1c level: less than 7%. Supplies needed:  Blood glucose meter.  Test strips for your meter. Each meter has its own strips. You must use the strips that came with your meter.  A needle to prick your finger (lancet). Do not use a lancet more than one time.  A device that holds the lancet (lancing device).  A journal or log book to write down your results. How to check your blood glucose  1. Wash your hands with soap and water. 2. Prick the side of your finger (not the tip) with the lancet. Use a different finger each time. 3. Gently rub the finger until a small drop of blood appears. 4. Follow instructions that come with your meter for inserting the test strip, applying blood to the strip, and using your blood glucose meter. 5. Write down your result and any notes. Some meters  allow you to use areas of your body other than your finger (alternative sites) to test your blood. The most common alternative sites are:  Forearm.  Thigh.  Palm of the hand. If you think you may have hypoglycemia, or if you have a history of not knowing when your blood glucose is getting low (hypoglycemia unawareness), do not use alternative sites. Use your finger instead. Alternative sites may not be as accurate as the fingers, because blood flow is slower in these areas. This means that the result you get may be delayed, and it may be different from the result that you would get from your finger. Follow these instructions at home: Blood glucose log   Every time you check your blood glucose, write down your result. Also write down any notes about things that may be affecting your blood glucose, such as your diet and exercise for the day. This information can help you and your health care provider: ? Look for patterns in your blood glucose over time. ? Adjust your diabetes management plan as needed.  Check if your meter allows you to download your records to a computer. Most glucose meters store a record of glucose readings in the meter. If you have type 1 diabetes:  Check your blood glucose 2 or more times a day.  Also check your blood glucose: ? Before every insulin injection. ? Before and after exercise. ? Before meals. ? 2 hours after a meal. ? Occasionally between 2:00 a.m. and 3:00 a.m., as directed. ? Before potentially dangerous tasks, like driving or using heavy machinery. ?   At bedtime.  You may need to check your blood glucose more often, up to 6-10 times a day, if you: ? Use an insulin pump. ? Need multiple daily injections (MDI). ? Have diabetes that is not well-controlled. ? Are ill. ? Have a history of severe hypoglycemia. ? Have hypoglycemia unawareness. If you have type 2 diabetes:  If you take insulin or other diabetes medicines, check your blood glucose 2 or  more times a day.  If you are on intensive insulin therapy, check your blood glucose 4 or more times a day. Occasionally, you may also need to check between 2:00 a.m. and 3:00 a.m., as directed.  Also check your blood glucose: ? Before and after exercise. ? Before potentially dangerous tasks, like driving or using heavy machinery.  You may need to check your blood glucose more often if: ? Your medicine is being adjusted. ? Your diabetes is not well-controlled. ? You are ill. General tips  Always keep your supplies with you.  If you have questions or need help, all blood glucose meters have a 24-hour "hotline" phone number that you can call. You may also contact your health care provider.  After you use a few boxes of test strips, adjust (calibrate) your blood glucose meter by following instructions that came with your meter. Contact a health care provider if:  Your blood glucose is at or above 240 mg/dL (13.3 mmol/L) for 2 days in a row.  You have been sick or have had a fever for 2 days or longer, and you are not getting better.  You have any of the following problems for more than 6 hours: ? You cannot eat or drink. ? You have nausea or vomiting. ? You have diarrhea. Get help right away if:  Your blood glucose is lower than 54 mg/dL (3 mmol/L).  You become confused or you have trouble thinking clearly.  You have difficulty breathing.  You have moderate or large ketone levels in your urine. Summary  Monitoring your blood sugar (glucose) is an important part of managing your diabetes (diabetes mellitus).  Blood glucose monitoring involves checking your blood glucose as often as directed and keeping a record (log) of your results over time.  Your health care provider will set individualized treatment goals for you. Your goals will be based on your age, other medical conditions you have, and how you respond to diabetes treatment.  Every time you check your blood glucose,  write down your result. Also write down any notes about things that may be affecting your blood glucose, such as your diet and exercise for the day. This information is not intended to replace advice given to you by your health care provider. Make sure you discuss any questions you have with your health care provider. Document Revised: 02/08/2018 Document Reviewed: 09/27/2015 Elsevier Patient Education  2020 Elsevier Inc.  

## 2019-12-22 ENCOUNTER — Other Ambulatory Visit: Payer: Self-pay | Admitting: Gerontology

## 2019-12-22 DIAGNOSIS — I10 Essential (primary) hypertension: Secondary | ICD-10-CM

## 2019-12-27 ENCOUNTER — Other Ambulatory Visit: Payer: Self-pay | Admitting: Gerontology

## 2019-12-27 DIAGNOSIS — I10 Essential (primary) hypertension: Secondary | ICD-10-CM

## 2019-12-30 ENCOUNTER — Other Ambulatory Visit: Payer: Self-pay | Admitting: Gerontology

## 2019-12-30 DIAGNOSIS — I10 Essential (primary) hypertension: Secondary | ICD-10-CM

## 2019-12-30 MED ORDER — ATENOLOL 50 MG PO TABS: 50.0000 mg | ORAL_TABLET | Freq: Every day | ORAL | 2 refills | Status: DC

## 2020-01-05 ENCOUNTER — Other Ambulatory Visit: Payer: Self-pay | Admitting: Gerontology

## 2020-01-05 DIAGNOSIS — I10 Essential (primary) hypertension: Secondary | ICD-10-CM

## 2020-01-08 ENCOUNTER — Ambulatory Visit: Payer: Self-pay

## 2020-01-15 ENCOUNTER — Ambulatory Visit: Payer: Self-pay

## 2020-01-20 ENCOUNTER — Other Ambulatory Visit: Payer: Self-pay | Admitting: Gerontology

## 2020-01-20 DIAGNOSIS — I10 Essential (primary) hypertension: Secondary | ICD-10-CM

## 2020-01-29 ENCOUNTER — Emergency Department
Admission: EM | Admit: 2020-01-29 | Discharge: 2020-01-29 | Disposition: A | Discharge status: Home / Self Care | Payer: Self-pay | Attending: Emergency Medicine | Admitting: Emergency Medicine

## 2020-01-29 ENCOUNTER — Other Ambulatory Visit: Payer: Self-pay

## 2020-01-29 ENCOUNTER — Encounter: Payer: Self-pay | Admitting: Emergency Medicine

## 2020-01-29 DIAGNOSIS — F410 Panic disorder [episodic paroxysmal anxiety] without agoraphobia: Secondary | ICD-10-CM | POA: Insufficient documentation

## 2020-01-29 DIAGNOSIS — R42 Dizziness and giddiness: Secondary | ICD-10-CM | POA: Insufficient documentation

## 2020-01-29 DIAGNOSIS — Z5321 Procedure and treatment not carried out due to patient leaving prior to being seen by health care provider: Secondary | ICD-10-CM | POA: Insufficient documentation

## 2020-01-29 DIAGNOSIS — R531 Weakness: Secondary | ICD-10-CM | POA: Insufficient documentation

## 2020-01-29 DIAGNOSIS — R064 Hyperventilation: Secondary | ICD-10-CM | POA: Insufficient documentation

## 2020-01-29 LAB — URINALYSIS, COMPLETE (UACMP) WITH MICROSCOPIC
Bacteria, UA: NONE SEEN
Bilirubin Urine: NEGATIVE
Glucose, UA: NEGATIVE mg/dL
Hgb urine dipstick: NEGATIVE
Ketones, ur: NEGATIVE mg/dL
Leukocytes,Ua: NEGATIVE
Nitrite: NEGATIVE
Protein, ur: NEGATIVE mg/dL
Specific Gravity, Urine: 1.003 — ABNORMAL LOW (ref 1.005–1.030)
Squamous Epithelial / HPF: NONE SEEN (ref 0–5)
WBC, UA: NONE SEEN WBC/hpf (ref 0–5)
pH: 7 (ref 5.0–8.0)

## 2020-01-29 LAB — CBC
HCT: 44.5 % (ref 39.0–52.0)
Hemoglobin: 16.6 g/dL (ref 13.0–17.0)
MCH: 31.3 pg (ref 26.0–34.0)
MCHC: 37.3 g/dL — ABNORMAL HIGH (ref 30.0–36.0)
MCV: 83.8 fL (ref 80.0–100.0)
Platelets: 267 10*3/uL (ref 150–400)
RBC: 5.31 MIL/uL (ref 4.22–5.81)
RDW: 13.5 % (ref 11.5–15.5)
WBC: 4.8 10*3/uL (ref 4.0–10.5)
nRBC: 0 % (ref 0.0–0.2)

## 2020-01-29 LAB — BASIC METABOLIC PANEL
Anion gap: 10 (ref 5–15)
BUN: 15 mg/dL (ref 6–20)
CO2: 24 mmol/L (ref 22–32)
Calcium: 9.1 mg/dL (ref 8.9–10.3)
Chloride: 102 mmol/L (ref 98–111)
Creatinine, Ser: 0.87 mg/dL (ref 0.61–1.24)
GFR calc Af Amer: >60 mL/min (ref >60)
GFR calc non Af Amer: >60 mL/min (ref >60)
Glucose, Bld: 129 mg/dL — ABNORMAL HIGH (ref 70–99)
Potassium: 3.5 mmol/L (ref 3.5–5.1)
Sodium: 136 mmol/L (ref 135–145)

## 2020-01-29 NOTE — ED Triage Notes (Signed)
Pt comes into the ED via EMS from work with c/o feeling weak and dizzy, ems reports pt hyperventilating on arrival with c/o pins and needles in hands and feet which has resolved. Pt states he has been out of his metformin and atenolol for the past 2 weeks because by the time he gets off work the store is closed.

## 2020-01-29 NOTE — ED Notes (Signed)
No answer when called pt to come back to a room by [employer]

## 2020-02-01 ENCOUNTER — Emergency Department
Admission: EM | Admit: 2020-02-01 | Discharge: 2020-02-01 | Disposition: A | Discharge status: Home / Self Care | Payer: Self-pay | Attending: Emergency Medicine | Admitting: Emergency Medicine

## 2020-02-01 ENCOUNTER — Encounter: Payer: Self-pay | Admitting: Emergency Medicine

## 2020-02-01 ENCOUNTER — Other Ambulatory Visit: Payer: Self-pay

## 2020-02-01 ENCOUNTER — Other Ambulatory Visit: Payer: Self-pay | Admitting: Gerontology

## 2020-02-01 DIAGNOSIS — Z79899 Other long term (current) drug therapy: Secondary | ICD-10-CM | POA: Insufficient documentation

## 2020-02-01 DIAGNOSIS — J449 Chronic obstructive pulmonary disease, unspecified: Secondary | ICD-10-CM | POA: Insufficient documentation

## 2020-02-01 DIAGNOSIS — F17210 Nicotine dependence, cigarettes, uncomplicated: Secondary | ICD-10-CM | POA: Insufficient documentation

## 2020-02-01 DIAGNOSIS — I10 Essential (primary) hypertension: Secondary | ICD-10-CM

## 2020-02-01 DIAGNOSIS — Z760 Encounter for issue of repeat prescription: Secondary | ICD-10-CM

## 2020-02-01 DIAGNOSIS — E119 Type 2 diabetes mellitus without complications: Secondary | ICD-10-CM

## 2020-02-01 DIAGNOSIS — Z7982 Long term (current) use of aspirin: Secondary | ICD-10-CM | POA: Insufficient documentation

## 2020-02-01 LAB — GLUCOSE, CAPILLARY: Glucose-Capillary: 126 mg/dL — ABNORMAL HIGH (ref 70–99)

## 2020-02-01 MED ORDER — TRIAMTERENE-HCTZ 37.5-25 MG PO CAPS: 1.0000 | ORAL_CAPSULE | Freq: Every day | ORAL | 1 refills | Status: DC

## 2020-02-01 MED ORDER — ATENOLOL 50 MG PO TABS: 50.0000 mg | ORAL_TABLET | Freq: Every day | ORAL | 1 refills | Status: DC

## 2020-02-01 MED ORDER — AMLODIPINE BESYLATE 10 MG PO TABS: 10.0000 mg | ORAL_TABLET | Freq: Every day | ORAL | 1 refills | Status: DC

## 2020-02-01 MED ORDER — METFORMIN HCL 500 MG PO TABS: 500.0000 mg | ORAL_TABLET | Freq: Every day | ORAL | 1 refills | Status: DC

## 2020-02-01 NOTE — ED Notes (Signed)
Pt ambulatory to the room in no acute distress.  Pt states he needs med refills (see triage note) and a note for work.

## 2020-02-01 NOTE — Discharge Instructions (Addendum)
[facility] your blood [facility] and also continued management of your diabetes.

## 2020-02-01 NOTE — ED Provider Notes (Signed)
[facility] Emergency Department Provider Note  ____________________________________________   First MD Initiated Contact with Patient 02/01/20 1007     (approximate)  I have reviewed the triage vital signs and the nursing notes.   HISTORY  Chief Complaint Medication Refill   HPI Christopher Harrell is a 49 y.o. male presents to the ED for medication refills.  Patient states that he needs refills on his blood pressure and diabetes medicine.  Patient states that [facility] but recently has been working 6 days a week and has been unable to be seen at the clinic.  Patient states that at one point he was told that "he may not need his diabetes medicine".  Patient reports that he has not eaten this morning and is only drank water.  He denies any symptoms of headache, dizziness or chest pain.         Past Medical History:  Diagnosis Date  . Community acquired pneumonia    a. Dx 01/21/2016 - LLL PNA.  . Essential hypertension   . Hyperlipidemia   . Tobacco abuse     Patient Active Problem List   Diagnosis Date Noted  . Essential hypertension 08/13/2019  . Erectile dysfunction 06/25/2019  . Acute flank pain 06/25/2019  . Tooth abscess 04/30/2019  . Smoking 04/30/2019  . Hypertriglyceridemia 06/05/2017  . COPD with acute exacerbation ([place]) 10/14/2016  . Pneumonia 10/14/2016  . Hypertensive urgency, malignant 06/13/2016  . Tobacco abuse 06/13/2016  . Community acquired pneumonia 01/23/2016  . Elevated troponin 01/23/2016  . HTN (hypertension) 01/23/2016  . Chest pain 01/23/2016    Past Surgical History:  Procedure Laterality Date  . NO PAST SURGERIES      Prior to Admission medications   Medication Sig Start Date End Date Taking? Authorizing Provider  albuterol (PROVENTIL HFA;VENTOLIN HFA) 108 (90 Base) MCG/ACT inhaler Inhale 2 puffs into the lungs every 6 (six) hours as needed for wheezing or shortness  of breath. Patient not taking: Reported on 12/11/2019 01/10/18   Erlene Quan, NP  amLODipine (NORVASC) 10 MG tablet Take 1 tablet (10 mg total) by mouth daily. 02/01/20   Johnn Hai, PA-C  aspirin EC 81 MG tablet Take 1 tablet (81 mg total) by mouth daily. Patient not taking: Reported on 11/06/2019 07/16/19   Iloabachie, Chioma E, NP  atenolol (TENORMIN) 50 MG tablet Take 1 tablet (50 mg total) by mouth daily. 02/01/20 01/31/21  Johnn Hai, PA-C  atorvastatin (LIPITOR) 10 MG tablet Take 1 tablet (10 mg total) by mouth daily. 11/06/19   Abate, Desta A, NP  doxazosin (CARDURA) 4 MG tablet Take 1 tablet (4 mg total) by mouth daily. 06/26/19   Zara Council A, PA-C  metFORMIN (GLUCOPHAGE) 500 MG tablet Take 1 tablet (500 mg total) by mouth daily. 02/01/20   Johnn Hai, PA-C  Multiple Vitamin (MULTIVITAMIN WITH MINERALS) TABS tablet Take 1 tablet by mouth daily.    [provider]  triamterene-hydrochlorothiazide (DYAZIDE) 37.5-25 MG capsule Take 1 each (1 capsule total) by mouth daily. 02/01/20   Johnn Hai, PA-C    Allergies Patient has no known allergies.  Family History  Problem Relation Age of Onset  . Hypertension Mother   . Heart attack Maternal Grandmother     Social History Social History   Tobacco Use  . Smoking status: Current Every Day Smoker    Packs/day: 0.50    Years: 20.00    Pack  years: 10.00    Types: Cigarettes  . Smokeless tobacco: Never Used  . Tobacco comment: Tried gum.  Vaping Use  . Vaping Use: Never used  Substance Use Topics  . Alcohol use: Not Currently  . Drug use: No    Review of Systems Constitutional: No fever/chills Eyes: No visual changes. ENT: No sore throat. Cardiovascular: Denies chest pain. Respiratory: Denies shortness of breath. Gastrointestinal: No abdominal pain.  No nausea, no vomiting.  No diarrhea.   Musculoskeletal: Muscle skeletal pain. Skin: Negative for rash. Neurological: Negative for  headaches, focal weakness or numbness. ____________________________________________   PHYSICAL EXAM:  VITAL SIGNS: ED Triage Vitals  Enc Vitals Group     BP 02/01/20 0856 125/87     Pulse Rate 02/01/20 0856 79     Resp 02/01/20 0856 16     Temp 02/01/20 0856 98.3 F (36.8 C)     Temp Source 02/01/20 0856 Oral     SpO2 02/01/20 0856 97 %     Weight 02/01/20 0857 210 lb (95.3 kg)     Height 02/01/20 0857 5\' 9"  (1.753 m)     Head Circumference --      Peak Flow --      Pain Score 02/01/20 0856 0     Pain Loc --      Pain Edu? --      Excl. in [place]? --     Constitutional: Alert and oriented. Well appearing and in no acute distress. Eyes: Conjunctivae are normal.  Head: Atraumatic. Nose: No congestion/rhinnorhea. Cardiovascular: Normal rate, regular rhythm. Grossly normal heart sounds.  Good peripheral circulation. Respiratory: Normal respiratory effort.  No retractions. Lungs CTAB. Gastrointestinal: Soft and nontender. No distention.  Musculoskeletal: Moves upper and lower extremities with any difficulty.  Normal gait was noted. Neurologic:  Normal speech and language. No gross focal neurologic deficits are appreciated. No gait instability. Skin:  Skin is warm, dry and intact. No rash noted. Psychiatric: Mood and affect are normal. Speech and behavior are normal.  ____________________________________________   LABS (all labs ordered are listed, but only abnormal results are displayed)  Labs Reviewed  GLUCOSE, CAPILLARY - Abnormal; Notable for the following components:      Result Value   Glucose-Capillary 126 (*)    All other components within normal limits  CBG MONITORING, ED    PROCEDURES  Procedure(s) performed (including Critical Care):  Procedures   ____________________________________________   INITIAL IMPRESSION / ASSESSMENT AND PLAN / ED COURSE  As part of my medical decision making, I reviewed the following data within the electronic MEDICAL RECORD NUMBER  Notes from prior ED visits and [place] Controlled Substance Database  49 year old male presents to the ED for refill of his blood pressure medication and also for his Metformin that he routinely takes.  Patient states [facility].  He denies any difficulties, no headaches, dizziness, chest pain.  Blood pressure is elevated as he has not taken his medication in approximately 3 days.  Fasting blood sugar was 126 in the ED.  Patient acknowledges that he continues to take Glucophage, Norvasc, Tenormin, and Dyazide as was documented in his chart.  These were sent to his pharmacy and he was encouraged to call and make arrangements to [facility] for [facility].  ____________________________________________   FINAL CLINICAL IMPRESSION(S) / ED DIAGNOSES  Final diagnoses:  Encounter for medication refill     ED Discharge Orders  Ordered    metFORMIN (GLUCOPHAGE) 500 MG tablet  Daily        02/01/20 1143    amLODipine (NORVASC) 10 MG tablet  Daily        02/01/20 1143    atenolol (TENORMIN) 50 MG tablet  Daily        02/01/20 1143    triamterene-hydrochlorothiazide (DYAZIDE) 37.5-25 MG capsule  Daily        02/01/20 1143          *Please note:  Dom Haverland was evaluated in Emergency Department on 02/01/2020 for the symptoms described in the history of present illness. He was evaluated in the context of the global COVID-19 pandemic, which necessitated consideration that the patient might be at risk for infection with the SARS-CoV-2 virus that causes COVID-19. Institutional protocols and algorithms that pertain to the evaluation of patients at risk for COVID-19 are in a state of rapid change based on information released by regulatory bodies including the CDC and federal and state organizations. These policies and algorithms were followed during the patient's care in the ED.  Some ED evaluations and  interventions may be delayed as a result of limited staffing during and the pandemic.*   Note:  This document was prepared using Dragon voice recognition software and may include unintentional dictation errors.    Johnn Hai, PA-C 02/01/20 1709    Naaman Plummer, MD 02/02/20 [id number]

## 2020-02-01 NOTE — ED Triage Notes (Signed)
Pt here because needs refill on blood pressure and diabetes meds.  Has not been able to get in to PCP because [facility] and has been working till 6 every day.  Came 08-15-2022 because had passed out but left before seen.  Here only today because would like meds refilled and needs a work note.  Does not want to be seen for anything else. VSS. Ambulatory. No pain

## 2020-02-01 NOTE — ED Notes (Signed)
BP 145/101, pt states he is going home and will take BP medication immediately.  Christopher Harrell aware and ok with discharge.

## 2020-02-01 NOTE — ED Notes (Signed)
Patient checked in last Thursday to be seen but didn't stay because of wait time. Back today because he needs his paperwork to be able to go back to work. Needs an "okay to go back to work paper."

## 2020-04-28 ENCOUNTER — Other Ambulatory Visit: Payer: Self-pay | Admitting: Gerontology

## 2020-04-28 DIAGNOSIS — I10 Essential (primary) hypertension: Secondary | ICD-10-CM

## 2020-04-28 DIAGNOSIS — E119 Type 2 diabetes mellitus without complications: Secondary | ICD-10-CM

## 2020-04-28 DIAGNOSIS — E781 Pure hyperglyceridemia: Secondary | ICD-10-CM

## 2020-04-28 MED ORDER — ATORVASTATIN CALCIUM 10 MG PO TABS: 10.0000 mg | ORAL_TABLET | Freq: Every day | ORAL | 1 refills | Status: DC

## 2020-04-28 MED ORDER — ATENOLOL 50 MG PO TABS: 50.0000 mg | ORAL_TABLET | Freq: Every day | ORAL | 1 refills | Status: DC

## 2020-04-28 MED ORDER — TRIAMTERENE-HCTZ 37.5-25 MG PO CAPS: 1.0000 | ORAL_CAPSULE | Freq: Every day | ORAL | 1 refills | Status: DC

## 2020-04-28 MED ORDER — AMLODIPINE BESYLATE 10 MG PO TABS: 10.0000 mg | ORAL_TABLET | Freq: Every day | ORAL | 1 refills | Status: DC

## 2020-04-28 MED ORDER — METFORMIN HCL 500 MG PO TABS: 500.0000 mg | ORAL_TABLET | Freq: Every day | ORAL | 1 refills | Status: DC

## 2020-04-29 ENCOUNTER — Other Ambulatory Visit: Payer: Self-pay | Admitting: Gerontology

## 2020-05-13 ENCOUNTER — Other Ambulatory Visit: Payer: Self-pay

## 2020-05-13 ENCOUNTER — Ambulatory Visit: Payer: Self-pay | Admitting: Gerontology

## 2020-05-13 VITALS — BP 129/85 | HR 82 | Ht 67.0 in | Wt 213.8 lb

## 2020-05-13 DIAGNOSIS — I10 Essential (primary) hypertension: Secondary | ICD-10-CM

## 2020-05-13 DIAGNOSIS — M545 Low back pain, unspecified: Secondary | ICD-10-CM

## 2020-05-13 DIAGNOSIS — Z0000 Encounter for general adult medical examination without abnormal findings: Secondary | ICD-10-CM

## 2020-05-13 DIAGNOSIS — M549 Dorsalgia, unspecified: Secondary | ICD-10-CM | POA: Insufficient documentation

## 2020-05-13 DIAGNOSIS — R7303 Prediabetes: Secondary | ICD-10-CM

## 2020-05-13 LAB — POCT GLYCOSYLATED HEMOGLOBIN (HGB A1C): Hemoglobin A1C: 5.8 % — AB (ref 4.0–5.6)

## 2020-05-13 MED ORDER — PREDNISONE 20 MG PO TABS: 20.0000 mg | ORAL_TABLET | Freq: Every day | ORAL | 0 refills | Status: DC

## 2020-05-13 MED ORDER — IBUPROFEN 800 MG PO TABS: 800.0000 mg | ORAL_TABLET | Freq: Two times a day (BID) | ORAL | 0 refills | Status: DC

## 2020-05-13 NOTE — Patient Instructions (Signed)

## 2020-05-13 NOTE — Progress Notes (Signed)
Established Patient Office Visit  Subjective:  Patient ID: Christopher Harrell, male    DOB: March 04, 1971  Age: 50 y.o. MRN: [id number]  CC:  Chief Complaint  Patient presents with  . Back Pain    HPI Prynce Jacober presents for follow up of hypertension and lab review. He states that he's compliant with his medications, does not check his blood pressure and adheres to DASH diet. He continues to smoke 1 pack of cigarette daily and admits the desire to quit. His last HgbA1c done during visit decreased from 6.5% to 5.8%. He does not check his blood glucose, and his reading during visit was 87 mg/dl. He reports having blurry vision and unable to read small print. He also c/o constant non traumatic non radiating 9/10 pain from mid to his lower back that has been going on for 1 month. He denies any relieving factor and standing, walking sitting aggravates symptoms. He denies urinary frequency, urgency, pelvic pain, fever and chills. Overall, he states that he's doing well but concerned about his back pain.  Past Medical History:  Diagnosis Date  . Community acquired pneumonia    a. Dx 01/21/2016 - LLL PNA.  . Essential hypertension   . Hyperlipidemia   . Tobacco abuse     Past Surgical History:  Procedure Laterality Date  . NO PAST SURGERIES      Family History  Problem Relation Age of Onset  . Hypertension Mother   . Heart attack Maternal Grandmother     Social History   Socioeconomic History  . Marital status: Legally Separated    Spouse name: Not on file  . Number of children: Not on file  . Years of education: Not on file  . Highest education level: Not on file  Occupational History  . Occupation: Picture framer    Comment: [employer]  Tobacco Use  . Smoking status: Current Every Day Smoker    Packs/day: 0.50    Years: 20.00    Pack years: 10.00    Types: Cigarettes  . Smokeless tobacco: Never Used  . Tobacco comment: Tried gum.  Vaping Use  . Vaping Use: Never used   Substance and Sexual Activity  . Alcohol use: Not Currently  . Drug use: No  . Sexual activity: Yes    Birth control/protection: None  Other Topics Concern  . Not on file  Social History Narrative   No longer living with wife. Walking for 20 mins per day   Social Determinants of Health   Financial Resource Strain: Not on file  Food Insecurity: Not on file  Transportation Needs: Not on file  Physical Activity: Not on file  Stress: Not on file  Social Connections: Not on file  Intimate Partner Violence: Not on file    Outpatient Medications Prior to Visit  Medication Sig Dispense Refill  . amLODipine (NORVASC) 10 MG tablet Take 1 tablet (10 mg total) by mouth daily. 30 tablet 1  . aspirin EC 81 MG tablet Take 1 tablet (81 mg total) by mouth daily. 30 tablet 3  . atenolol (TENORMIN) 50 MG tablet Take 1 tablet (50 mg total) by mouth daily. 30 tablet 1  . atorvastatin (LIPITOR) 10 MG tablet Take 1 tablet (10 mg total) by mouth daily. 30 tablet 1  . metFORMIN (GLUCOPHAGE) 500 MG tablet Take 1 tablet (500 mg total) by mouth daily. 30 tablet 1  . Multiple Vitamin (MULTIVITAMIN WITH MINERALS) TABS tablet Take 1 tablet by mouth daily.    Marland Kitchen  triamterene-hydrochlorothiazide (DYAZIDE) 37.5-25 MG capsule Take 1 each (1 capsule total) by mouth daily. 30 capsule 1  . doxazosin (CARDURA) 4 MG tablet Take 1 tablet (4 mg total) by mouth daily. 30 tablet 1  . albuterol (PROVENTIL HFA;VENTOLIN HFA) 108 (90 Base) MCG/ACT inhaler Inhale 2 puffs into the lungs every 6 (six) hours as needed for wheezing or shortness of breath. (Patient not taking: No sig reported) 1 Inhaler 3   No facility-administered medications prior to visit.    No Known Allergies  ROS Review of Systems  Constitutional: Negative.   Eyes: Positive for visual disturbance.  Respiratory: Negative.   Cardiovascular: Negative.   Endocrine: Negative.   Genitourinary: Negative for frequency and urgency.  Musculoskeletal: Positive  for back pain.  Neurological: Negative.       Objective:    Physical Exam HENT:     Head: Normocephalic and atraumatic.  Eyes:     Pupils: Pupils are equal, round, and reactive to light.  Cardiovascular:     Rate and Rhythm: Normal rate and regular rhythm.     Pulses: Normal pulses.     Heart sounds: Normal heart sounds.  Pulmonary:     Effort: Pulmonary effort is normal.     Breath sounds: Normal breath sounds.  Abdominal:     Tenderness: There is right CVA tenderness and left CVA tenderness.  Musculoskeletal:        General: Tenderness (palpation from mid to lower back) present.  Skin:    General: Skin is warm.  Neurological:     General: No focal deficit present.     Mental Status: He is alert and oriented to person, place, and time. Mental status is at baseline.  Psychiatric:        Mood and Affect: Mood normal.        Behavior: Behavior normal.        Thought Content: Thought content normal.        Judgment: Judgment normal.     BP 129/85 (BP Location: Left Arm, Patient Position: Sitting, Cuff Size: Large)   Pulse 82   Ht 5' 7"  (1.702 m)   Wt 213 lb 12.8 oz (97 kg)   SpO2 95%   BMI 33.49 kg/m  Wt Readings from Last 3 Encounters:  05/13/20 213 lb 12.8 oz (97 kg)  02/01/20 210 lb (95.3 kg)  12/11/19 218 lb 3.2 oz (99 kg)   He was encouraged to loose weight.  Health Maintenance Due  Topic Date Due  . Hepatitis C Screening  Never done  . TETANUS/TDAP  Never done  . COLONOSCOPY (Pts 45-87yr Insurance coverage will need to be confirmed)  Never done  . INFLUENZA VACCINE  11/30/2019  . COVID-19 Vaccine (3 - Booster for Pfizer series) 02/22/2020    There are no preventive care reminders to display for this patient.  Lab Results  Component Value Date   TSH 1.110 06/13/2016   Lab Results  Component Value Date   WBC 4.8 01/29/2020   HGB 16.6 01/29/2020   HCT 44.5 01/29/2020   MCV 83.8 01/29/2020   PLT 267 01/29/2020   Lab Results  Component Value  Date   NA 136 01/29/2020   K 3.5 01/29/2020   CO2 24 01/29/2020   GLUCOSE 129 (H) 01/29/2020   BUN 15 01/29/2020   CREATININE 0.87 01/29/2020   BILITOT 0.5 06/25/2019   ALKPHOS 66 06/25/2019   AST 28 06/25/2019   ALT 35 06/25/2019   PROT 7.3 06/25/2019  ALBUMIN 4.6 06/25/2019   CALCIUM 9.1 01/29/2020   ANIONGAP 10 01/29/2020   Lab Results  Component Value Date   CHOL 239 (H) 10/08/2019   Lab Results  Component Value Date   HDL 39 (L) 10/08/2019   Lab Results  Component Value Date   LDLCALC 135 (H) 10/08/2019   Lab Results  Component Value Date   TRIG 360 (H) 10/08/2019   Lab Results  Component Value Date   CHOLHDL 6.1 (H) 10/08/2019   Lab Results  Component Value Date   HGBA1C 5.8 (A) 05/13/2020      Assessment & Plan:    1. Essential hypertension - His blood pressure is under control and he will continue on current treatment regimen, DASH diet. He will follow up with [facility] Ophthalmologist for eye exam. - Ambulatory referral to Ophthalmology - Comp Met (CMET); Future - Comp Met (CMET)  2. Prediabetes - His HgbA1c has improved to 5.8%, he will continue on current treatment regimen, low carb/ non concentrated sweet diet and exercise as tolerated. - Ambulatory referral to Ophthalmology - POCT HgB A1C; Future - POCT HgB A1C  3. Health care maintenance - He was advised to complete [facility] financial application for Colonoscopy screening. - Ambulatory referral to Gastroenterology  4. Acute bilateral low back pain, unspecified whether sciatica present - Back pain likely muscle strain, will check Urine culture for possible UTI. He will Prednisone 20 mg daily for 5 days for Inflammation and 800 mg Ibuprofen bid with food for 14 days. He was educated on medication side effects and advised to notify the clinic or go to the ED - UA/M w/rflx Culture, Routine; Future - predniSONE (DELTASONE) 20 MG tablet; Take 1 tablet (20 mg total) by mouth daily with breakfast.   Dispense: 5 tablet; Refill: 0 - ibuprofen (ADVIL) 800 MG tablet; Take 1 tablet (800 mg total) by mouth in the morning and at bedtime.  Dispense: 14 tablet; Refill: 0 - UA/M w/rflx Culture, Routine    Follow-up: Return in about 2 weeks (around 05/27/2020), or if symptoms worsen or fail to improve.    Felecia Stanfill Jerold Coombe, NP

## 2020-05-14 LAB — UA/M W/RFLX CULTURE, ROUTINE
Bilirubin, UA: NEGATIVE
Glucose, UA: NEGATIVE
Ketones, UA: NEGATIVE
Leukocytes,UA: NEGATIVE
Nitrite, UA: NEGATIVE
Protein,UA: NEGATIVE
RBC, UA: NEGATIVE
Specific Gravity, UA: 1.027 (ref 1.005–1.030)
Urobilinogen, Ur: 0.2 mg/dL (ref 0.2–1.0)
pH, UA: 5 (ref 5.0–7.5)

## 2020-05-14 LAB — COMPREHENSIVE METABOLIC PANEL
ALT: 30 IU/L (ref 0–44)
AST: 23 IU/L (ref 0–40)
Albumin/Globulin Ratio: 1.8 (ref 1.2–2.2)
Albumin: 4.6 g/dL (ref 3.8–4.9)
Alkaline Phosphatase: 72 IU/L (ref 44–121)
BUN/Creatinine Ratio: 19 (ref 9–20)
BUN: 18 mg/dL (ref 6–24)
Bilirubin Total: 0.7 mg/dL (ref 0.0–1.2)
CO2: 25 mmol/L (ref 20–29)
Calcium: 9.6 mg/dL (ref 8.7–10.2)
Chloride: 103 mmol/L (ref 96–106)
Creatinine, Ser: 0.94 mg/dL (ref 0.76–1.27)
GFR calc Af Amer: 108 mL/min/{1.73_m2} (ref >59)
GFR calc non Af Amer: 93 mL/min/{1.73_m2} (ref >59)
Globulin, Total: 2.6 g/dL (ref 1.5–4.5)
Glucose: 155 mg/dL — ABNORMAL HIGH (ref 65–99)
Potassium: 3.9 mmol/L (ref 3.5–5.2)
Sodium: 141 mmol/L (ref 134–144)
Total Protein: 7.2 g/dL (ref 6.0–8.5)

## 2020-05-14 LAB — MICROSCOPIC EXAMINATION
Bacteria, UA: NONE SEEN
Casts: NONE SEEN /lpf
Epithelial Cells (non renal): NONE SEEN /hpf (ref 0–10)
RBC, Urine: NONE SEEN /hpf (ref 0–2)
WBC, UA: NONE SEEN /hpf (ref 0–5)

## 2020-05-15 ENCOUNTER — Other Ambulatory Visit: Payer: Self-pay | Admitting: Gerontology

## 2020-05-15 DIAGNOSIS — I10 Essential (primary) hypertension: Secondary | ICD-10-CM

## 2020-05-21 ENCOUNTER — Other Ambulatory Visit: Payer: Self-pay | Admitting: Gerontology

## 2020-05-21 DIAGNOSIS — E119 Type 2 diabetes mellitus without complications: Secondary | ICD-10-CM

## 2020-05-27 ENCOUNTER — Other Ambulatory Visit: Payer: Self-pay

## 2020-05-27 DIAGNOSIS — I10 Essential (primary) hypertension: Secondary | ICD-10-CM

## 2020-05-27 NOTE — Progress Notes (Unsigned)
a1c

## 2020-05-28 LAB — COMPREHENSIVE METABOLIC PANEL
ALT: 37 IU/L (ref 0–44)
AST: 18 IU/L (ref 0–40)
Albumin/Globulin Ratio: 1.9 (ref 1.2–2.2)
Albumin: 4.7 g/dL (ref 3.8–4.9)
Alkaline Phosphatase: 68 IU/L (ref 44–121)
BUN/Creatinine Ratio: 15 (ref 9–20)
BUN: 16 mg/dL (ref 6–24)
Bilirubin Total: 0.9 mg/dL (ref 0.0–1.2)
CO2: 25 mmol/L (ref 20–29)
Calcium: 9.9 mg/dL (ref 8.7–10.2)
Chloride: 100 mmol/L (ref 96–106)
Creatinine, Ser: 1.04 mg/dL (ref 0.76–1.27)
GFR calc Af Amer: 96 mL/min/{1.73_m2} (ref >59)
GFR calc non Af Amer: 83 mL/min/{1.73_m2} (ref >59)
Globulin, Total: 2.5 g/dL (ref 1.5–4.5)
Glucose: 95 mg/dL (ref 65–99)
Potassium: 4 mmol/L (ref 3.5–5.2)
Sodium: 140 mmol/L (ref 134–144)
Total Protein: 7.2 g/dL (ref 6.0–8.5)

## 2020-05-28 LAB — HEMOGLOBIN A1C
Est. average glucose Bld gHb Est-mCnc: 108 mg/dL
Hgb A1c MFr Bld: 5.4 % (ref 4.8–5.6)

## 2020-05-28 LAB — MICROALBUMIN / CREATININE URINE RATIO
Creatinine, Urine: 75.5 mg/dL
Microalb/Creat Ratio: 44 mg/g creat — ABNORMAL HIGH (ref 0–29)
Microalbumin, Urine: 33.1 ug/mL

## 2020-06-03 ENCOUNTER — Other Ambulatory Visit: Payer: Self-pay

## 2020-06-03 ENCOUNTER — Ambulatory Visit: Payer: Self-pay

## 2020-06-10 ENCOUNTER — Encounter: Payer: Self-pay | Admitting: Obstetrics and Gynecology

## 2020-06-10 ENCOUNTER — Ambulatory Visit: Payer: Self-pay | Admitting: Obstetrics and Gynecology

## 2020-06-10 ENCOUNTER — Other Ambulatory Visit: Payer: Self-pay

## 2020-06-10 VITALS — BP 163/84 | HR 77 | Temp 97.2°F | Ht 66.0 in | Wt 215.0 lb

## 2020-06-10 DIAGNOSIS — M5442 Lumbago with sciatica, left side: Secondary | ICD-10-CM | POA: Insufficient documentation

## 2020-06-10 DIAGNOSIS — R0602 Shortness of breath: Secondary | ICD-10-CM

## 2020-06-10 DIAGNOSIS — G8929 Other chronic pain: Secondary | ICD-10-CM

## 2020-06-10 DIAGNOSIS — M62838 Other muscle spasm: Secondary | ICD-10-CM

## 2020-06-10 MED ORDER — IBUPROFEN 800 MG PO TABS: 800.0000 mg | ORAL_TABLET | Freq: Two times a day (BID) | ORAL | 0 refills | Status: DC

## 2020-06-10 MED ORDER — ALBUTEROL SULFATE HFA 108 (90 BASE) MCG/ACT IN AERS: 2.0000 | INHALATION_SPRAY | Freq: Four times a day (QID) | RESPIRATORY_TRACT | 1 refills | Status: DC | PRN

## 2020-06-10 MED ORDER — CYCLOBENZAPRINE HCL 10 MG PO TABS: 10.0000 mg | ORAL_TABLET | Freq: Three times a day (TID) | ORAL | 0 refills | Status: DC | PRN

## 2020-06-10 NOTE — Progress Notes (Signed)
[facility]    Patient, No Pcp Per   Chief Complaint  Patient presents with  . Back Pain    Complains of lower back pain at a constant 9/10.    HPI:      Christopher Harrell is a 50 y.o. presents today for LBP on and off for 2 yrs. Hx of fall in distant, but no known trigger for sx 2 yrs ago. 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 Sx increase with activity, improved with rest. Hurts lying down at night. With sciatic pain bilat legs. Did prednisone taper with some relief. Took ibup 800 mg with some relief, but sx resolved. No relief with heat/ice. No stretching/massage. Has weakness and numbness in legs. No recent xray. Neg C&S for UTI 1/22.  Needs Rx RF albuterol for occas SOB. No hx of asthma but does smoke, not going to quit.   Past Medical History:  Diagnosis Date  . Community acquired pneumonia    a. Dx 01/21/2016 - LLL PNA.  . Essential hypertension   . Hyperlipidemia   . Tobacco abuse     Past Surgical History:  Procedure Laterality Date  . NO PAST SURGERIES      Family History  Problem Relation Age of Onset  . Hypertension Mother   . Heart attack Maternal Grandmother     Social History   Socioeconomic History  . Marital status: Legally Separated    Spouse name: Not on file  . Number of children: Not on file  . Years of education: Not on file  . Highest education level: Not on file  Occupational History  . Occupation: Picture framer    Comment: [employer]  Tobacco Use  . Smoking status: Current Every Day Smoker    Packs/day: 0.50    Years: 20.00    Pack years: 10.00    Types: Cigarettes  . Smokeless tobacco: Never Used  . Tobacco comment: Tried gum.  Vaping Use  . Vaping Use: Never used  Substance and Sexual Activity  . Alcohol use: Not Currently  . Drug use: No  . Sexual activity: Yes    Birth control/protection: None  Other Topics Concern  . Not on file   Social History Narrative   No longer living with wife. Walking for 20 mins per day   Social Determinants of Health   Financial Resource Strain: Not on file  Food Insecurity: Not on file  Transportation Needs: Not on file  Physical Activity: Not on file  Stress: Not on file  Social Connections: Not on file  Intimate Partner Violence: Not on file    Outpatient Medications Prior to Visit  Medication Sig Dispense Refill  . amLODipine (NORVASC) 10 MG tablet Take 1 tablet (10 mg total) by mouth daily. 30 tablet 1  . aspirin EC 81 MG tablet Take 1 tablet (81 mg total) by mouth daily. 30 tablet 3  . atenolol (TENORMIN) 50 MG tablet Take 1 tablet (50 mg total) by mouth daily. 30 tablet 1  . atorvastatin (LIPITOR) 10 MG tablet Take 1 tablet (10 mg total) by mouth daily. 30 tablet 1  . metFORMIN (GLUCOPHAGE) 500 MG tablet TAKE 1 TABLET (500 MG TOTAL) BY MOUTH DAILY. 30 tablet 1  . Multiple Vitamin (MULTIVITAMIN WITH MINERALS) TABS tablet Take 1 tablet by mouth daily.    . predniSONE (DELTASONE) 20 MG tablet Take 1 tablet (20 mg  total) by mouth daily with breakfast. 5 tablet 0  . triamterene-hydrochlorothiazide (DYAZIDE) 37.5-25 MG capsule Take 1 each (1 capsule total) by mouth daily. 30 capsule 1  . albuterol (PROVENTIL HFA;VENTOLIN HFA) 108 (90 Base) MCG/ACT inhaler Inhale 2 puffs into the lungs every 6 (six) hours as needed for wheezing or shortness of breath. (Patient not taking: No sig reported) 1 Inhaler 3  . ibuprofen (ADVIL) 800 MG tablet Take 1 tablet (800 mg total) by mouth in the morning and at bedtime. 14 tablet 0   No facility-administered medications prior to visit.      ROS:  Review of Systems BREAST: No symptoms   OBJECTIVE:   Vitals:  BP (!) 163/84 (BP Location: Left Arm, Patient Position: Sitting, Cuff Size: Normal)   Pulse 77   Temp (!) 97.2 F (36.2 C) (Temporal)   Ht 5\' 6"  (1.676 m)   Wt 215 lb (97.5 kg)   SpO2 95%   BMI 34.70 kg/m   Physical  Exam Musculoskeletal:       Arms:     Results: No results found for this or any previous visit (from the past 24 hour(s)).   Assessment/Plan: Chronic midline low back pain with bilateral sciatica - Plan: ibuprofen (ADVIL) 800 MG tablet, DG Lumbar Spine Complete, cyclobenzaprine (FLEXERIL) 10 MG tablet--tender to palpate lumbar muscles. Muscle spasm. Stretch/ice/heat/massage/Rx RF ibup 800 mg/flexeril prn sx for relief. Rule out path with xray.  Muscle spasm  Shortness of breath - Plan: albuterol (VENTOLIN HFA) 108 (90 Base) MCG/ACT inhaler; Rx RF due to occas SOB due to smoking.     Meds ordered this encounter  Medications  . albuterol (VENTOLIN HFA) 108 (90 Base) MCG/ACT inhaler    Sig: Inhale 2 puffs into the lungs every 6 (six) hours as needed for wheezing or shortness of breath.    Dispense:  1 each    Refill:  1    Order Specific Question:   Supervising Provider    Answer:   Gae Dry [id number]  . ibuprofen (ADVIL) 800 MG tablet    Sig: Take 1 tablet (800 mg total) by mouth in the morning and at bedtime.    Dispense:  30 tablet    Refill:  0    Order Specific Question:   Supervising Provider    Answer:   Gae Dry [id number]  . cyclobenzaprine (FLEXERIL) 10 MG tablet    Sig: Take 1 tablet (10 mg total) by mouth every 8 (eight) hours as needed for muscle spasms.    Dispense:  30 tablet    Refill:  0    Order Specific Question:   Supervising Provider    Answer:   Gae Dry [[id number]]      No follow-ups on file.  Chananya Canizalez B. Tashira Torre, PA-C 06/10/2020 8:30 PM

## 2020-06-10 NOTE — Patient Instructions (Signed)
I value your feedback and you entrusting us with your care. If you get a Christopher Harrell patient survey, I would appreciate you taking the time to let us know about your experience today. Thank you! ? ? ?

## 2020-06-20 ENCOUNTER — Other Ambulatory Visit: Payer: Self-pay | Admitting: Gerontology

## 2020-06-20 DIAGNOSIS — E781 Pure hyperglyceridemia: Secondary | ICD-10-CM

## 2020-06-20 DIAGNOSIS — E119 Type 2 diabetes mellitus without complications: Secondary | ICD-10-CM

## 2020-06-22 ENCOUNTER — Telehealth: Payer: Self-pay

## 2020-06-22 NOTE — Telephone Encounter (Signed)
Need to schedule f/u appt for 2nd week of March. LVM.

## 2020-06-24 ENCOUNTER — Telehealth: Payer: Self-pay | Admitting: Gerontology

## 2020-06-24 NOTE — Telephone Encounter (Signed)
LVM for pt to schedule an appt with Benjamine Mola on the second week of March.   MD 06/24/20 @ 3:20

## 2020-07-02 LAB — HM DIABETES EYE EXAM

## 2020-07-03 ENCOUNTER — Other Ambulatory Visit: Payer: Self-pay | Admitting: Urology

## 2020-07-03 ENCOUNTER — Other Ambulatory Visit: Payer: Self-pay | Admitting: Gerontology

## 2020-07-03 DIAGNOSIS — I10 Essential (primary) hypertension: Secondary | ICD-10-CM

## 2020-07-08 ENCOUNTER — Ambulatory Visit: Payer: Self-pay | Admitting: Gerontology

## 2020-07-08 ENCOUNTER — Other Ambulatory Visit: Payer: Self-pay

## 2020-07-08 ENCOUNTER — Encounter: Payer: Self-pay | Admitting: Gerontology

## 2020-07-08 VITALS — BP 137/94 | HR 73 | Ht 69.0 in | Wt 214.1 lb

## 2020-07-08 DIAGNOSIS — M545 Low back pain, unspecified: Secondary | ICD-10-CM

## 2020-07-08 DIAGNOSIS — N529 Male erectile dysfunction, unspecified: Secondary | ICD-10-CM

## 2020-07-08 MED ORDER — LIDOCAINE 5 % EX PTCH: 1.0000 | MEDICATED_PATCH | CUTANEOUS | 0 refills | Status: DC

## 2020-07-08 MED ORDER — SILDENAFIL CITRATE 25 MG PO TABS: 25.0000 mg | ORAL_TABLET | Freq: Every day | ORAL | 0 refills | Status: DC | PRN

## 2020-07-08 NOTE — Patient Instructions (Signed)
Acute Back Pain, Adult Acute back pain is sudden and usually short-lived. It is often caused by an injury to the muscles and tissues in the back. The injury may result from:  A muscle or ligament getting overstretched or torn (strained). Ligaments are tissues that connect bones to each other. Lifting something improperly can cause a back strain.  Wear and tear (degeneration) of the spinal disks. Spinal disks are circular tissue that provide cushioning between the bones of the spine (vertebrae).  Twisting motions, such as while playing sports or doing yard work.  A hit to the back.  Arthritis. You may have a physical exam, lab tests, and imaging tests to find the cause of your pain. Acute back pain usually goes away with rest and home care. Follow these instructions at home: Managing pain, stiffness, and swelling  Treatment may include medicines for pain and inflammation that are taken by mouth or applied to the skin, prescription pain medicine, or muscle relaxants. Take over-the-counter and prescription medicines only as told by your health care provider.  Your health care provider may recommend applying ice during the first 24-48 hours after your pain starts. To do this: ? Put ice in a plastic bag. ? Place a towel between your skin and the bag. ? Leave the ice on for 20 minutes, 2-3 times a day.  If directed, apply heat to the affected area as often as told by your health care provider. Use the heat source that your health care provider recommends, such as a moist heat pack or a heating pad. ? Place a towel between your skin and the heat source. ? Leave the heat on for 20-30 minutes. ? Remove the heat if your skin turns bright red. This is especially important if you are unable to feel pain, heat, or cold. You have a greater risk of getting burned. Activity  Do not stay in bed. Staying in bed for more than 1-2 days can delay your recovery.  Sit up and stand up straight. Avoid leaning  forward when you sit or hunching over when you stand. ? If you work at a desk, sit close to it so you do not need to lean over. Keep your chin tucked in. Keep your neck drawn back, and keep your elbows bent at a 90-degree angle (right angle). ? Sit high and close to the steering wheel when you drive. Add lower back (lumbar) support to your car seat, if needed.  Take short walks on even surfaces as soon as you are able. Try to increase the length of time you walk each day.  Do not sit, drive, or stand in one place for more than 30 minutes at a time. Sitting or standing for long periods of time can put stress on your back.  Do not drive or use heavy machinery while taking prescription pain medicine.  Use proper lifting techniques. When you bend and lift, use positions that put less stress on your back: ? Bend your knees. ? Keep the load close to your body. ? Avoid twisting.  Exercise regularly as told by your health care provider. Exercising helps your back heal faster and helps prevent back injuries by keeping muscles strong and flexible.  Work with a physical therapist to make a safe exercise program, as recommended by your health care provider. Do any exercises as told by your physical therapist.   Lifestyle  Maintain a healthy weight. Extra weight puts stress on your back and makes it difficult to have   good posture.  Avoid activities or situations that make you feel anxious or stressed. Stress and anxiety increase muscle tension and can make back pain worse. Learn ways to manage anxiety and stress, such as through exercise. General instructions  Sleep on a firm mattress in a comfortable position. Try lying on your side with your knees slightly bent. If you lie on your back, put a pillow under your knees.  Follow your treatment plan as told by your health care provider. This may include: ? Cognitive or behavioral therapy. ? Acupuncture or massage therapy. ? Meditation or yoga. Contact  a health care provider if:  You have pain that is not relieved with rest or medicine.  You have increasing pain going down into your legs or buttocks.  Your pain does not improve after 2 weeks.  You have pain at night.  You lose weight without trying.  You have a fever or chills. Get help right away if:  You develop new bowel or bladder control problems.  You have unusual weakness or numbness in your arms or legs.  You develop nausea or vomiting.  You develop abdominal pain.  You feel faint. Summary  Acute back pain is sudden and usually short-lived.  Use proper lifting techniques. When you bend and lift, use positions that put less stress on your back.  Take over-the-counter and prescription medicines and apply heat or ice as directed by your health care provider. This information is not intended to replace advice given to you by your health care provider. Make sure you discuss any questions you have with your health care provider. Document Revised: 01/09/2020 Document Reviewed: 01/09/2020 Elsevier Patient Education  2021 Elsevier Inc.  

## 2020-07-08 NOTE — Progress Notes (Signed)
Established Patient Office Visit  Subjective:  Patient ID: Christopher Harrell, male    DOB: 10-26-1970  Age: 50 y.o. MRN: [id number]  CC:  Chief Complaint  Patient presents with  . Back Pain    Mid/lower back is in pain, eft work early couldn't stand, back spasms.    HPI Christopher Harrell presents for c/o of non traumatic constant non radiating muscle spasms to his lower back that was painful and medication refill. He describes pain as sharp 8/10. He states that he left work because his pain was unbearable and he couldn't stand nor walk. He denies saddle anesthesia, bladder nor bowel incontinence. He was seen at [facility] on 06/10/20 for muscle spasm to his back and Flexeril 10 mg , Ibuprofen 800 mg prn and Imaging to Lumbar spine was ordered. He admits to experiencing minimal relief with taking Flexeril and Ibuprofen and has not done Spine X ray. Overall, he states that he's doing well except for his back discomfort.  Past Medical History:  Diagnosis Date  . Community acquired pneumonia    a. Dx 01/21/2016 - LLL PNA.  . Essential hypertension   . Hyperlipidemia   . Tobacco abuse     Past Surgical History:  Procedure Laterality Date  . NO PAST SURGERIES      Family History  Problem Relation Age of Onset  . Hypertension Mother   . Heart attack Maternal Grandmother     Social History   Socioeconomic History  . Marital status: Legally Separated    Spouse name: Not on file  . Number of children: Not on file  . Years of education: Not on file  . Highest education level: Not on file  Occupational History  . Occupation: Picture framer    Comment: [employer]  Tobacco Use  . Smoking status: Current Every Day Smoker    Packs/day: 0.50    Years: 20.00    Pack years: 10.00    Types: Cigarettes  . Smokeless tobacco: Never Used  . Tobacco comment: Tried gum.  Vaping Use  . Vaping Use: Never used  Substance and Sexual Activity  . Alcohol use: Not Currently  . Drug use: No  . Sexual  activity: Yes    Birth control/protection: None  Other Topics Concern  . Not on file  Social History Narrative   No longer living with wife. Walking for 20 mins per day   Social Determinants of Health   Financial Resource Strain: Not on file  Food Insecurity: Not on file  Transportation Needs: Not on file  Physical Activity: Not on file  Stress: Not on file  Social Connections: Not on file  Intimate Partner Violence: Not on file    Outpatient Medications Prior to Visit  Medication Sig Dispense Refill  . amLODipine (NORVASC) 10 MG tablet Take 1 tablet (10 mg total) by mouth daily. 30 tablet 1  . aspirin EC 81 MG tablet Take 1 tablet (81 mg total) by mouth daily. 30 tablet 3  . atenolol (TENORMIN) 50 MG tablet Take 1 tablet (50 mg total) by mouth daily. 30 tablet 1  . atorvastatin (LIPITOR) 10 MG tablet TAKE 1 TABLET BY MOUTH EVERY DAY 30 tablet 1  . cyclobenzaprine (FLEXERIL) 10 MG tablet Take 1 tablet (10 mg total) by mouth every 8 (eight) hours as needed for muscle spasms. 30 tablet 0  . ibuprofen (ADVIL) 800 MG tablet Take 1 tablet (800 mg total) by mouth in the morning and at bedtime. 30 tablet 0  .  metFORMIN (GLUCOPHAGE) 500 MG tablet TAKE 1 TABLET (500 MG TOTAL) BY MOUTH DAILY. 30 tablet 1  . Multiple Vitamin (MULTIVITAMIN WITH MINERALS) TABS tablet Take 1 tablet by mouth daily.    Marland Kitchen triamterene-hydrochlorothiazide (DYAZIDE) 37.5-25 MG capsule TAKE 1 CAPSULE BY MOUTH EVERY DAY 30 capsule 1  . albuterol (VENTOLIN HFA) 108 (90 Base) MCG/ACT inhaler Inhale 2 puffs into the lungs every 6 (six) hours as needed for wheezing or shortness of breath. 1 each 1  . predniSONE (DELTASONE) 20 MG tablet Take 1 tablet (20 mg total) by mouth daily with breakfast. (Patient not taking: Reported on 07/08/2020) 5 tablet 0   No facility-administered medications prior to visit.    No Known Allergies  ROS Review of Systems  Constitutional: Negative.   Respiratory: Negative.   Musculoskeletal:  Positive for back pain.  Neurological: Negative.       Objective:    Physical Exam HENT:     Head: Normocephalic and atraumatic.  Cardiovascular:     Rate and Rhythm: Normal rate and regular rhythm.     Pulses: Normal pulses.     Heart sounds: Normal heart sounds.  Pulmonary:     Effort: Pulmonary effort is normal.     Breath sounds: Normal breath sounds.  Musculoskeletal:        General: Tenderness (palpation to bilateral lower back) present.  Neurological:     General: No focal deficit present.     Mental Status: He is alert and oriented to person, place, and time. Mental status is at baseline.  Psychiatric:        Mood and Affect: Mood normal.        Behavior: Behavior normal.        Thought Content: Thought content normal.        Judgment: Judgment normal.     BP (!) 137/94 (BP Location: Right Arm, Patient Position: Sitting)   Pulse 73   Ht 5\' 9"  (1.753 m)   Wt 214 lb 1.6 oz (97.1 kg)   SpO2 97%   BMI 31.62 kg/m  Wt Readings from Last 3 Encounters:  07/08/20 214 lb 1.6 oz (97.1 kg)  06/10/20 215 lb (97.5 kg)  05/13/20 213 lb 12.8 oz (97 kg)     Health Maintenance Due  Topic Date Due  . Hepatitis C Screening  Never done  . TETANUS/TDAP  Never done  . COLONOSCOPY (Pts 45-63yrs Insurance coverage will need to be confirmed)  Never done  . INFLUENZA VACCINE  11/30/2019  . COVID-19 Vaccine (3 - Booster for Pfizer series) 02/22/2020    There are no preventive care reminders to display for this patient.  Lab Results  Component Value Date   TSH 1.110 06/13/2016   Lab Results  Component Value Date   WBC 4.8 01/29/2020   HGB 16.6 01/29/2020   HCT 44.5 01/29/2020   MCV 83.8 01/29/2020   PLT 267 01/29/2020   Lab Results  Component Value Date   NA 140 05/27/2020   K 4.0 05/27/2020   CO2 25 05/27/2020   GLUCOSE 95 05/27/2020   BUN 16 05/27/2020   CREATININE 1.04 05/27/2020   BILITOT 0.9 05/27/2020   ALKPHOS 68 05/27/2020   AST 18 05/27/2020   ALT 37  05/27/2020   PROT 7.2 05/27/2020   ALBUMIN 4.7 05/27/2020   CALCIUM 9.9 05/27/2020   ANIONGAP 10 01/29/2020   Lab Results  Component Value Date   CHOL 239 (H) 10/08/2019   Lab Results  Component Value  Date   HDL 39 (L) 10/08/2019   Lab Results  Component Value Date   LDLCALC 135 (H) 10/08/2019   Lab Results  Component Value Date   TRIG 360 (H) 10/08/2019   Lab Results  Component Value Date   CHOLHDL 6.1 (H) 10/08/2019   Lab Results  Component Value Date   HGBA1C 5.4 05/27/2020      Assessment & Plan:    1. Acute bilateral low back pain, unspecified whether sciatica present - He was advised to have Spine X ray done and to follow up with Dr Vickki Hearing. He will continue on Lidocaine for back pain. - lidocaine (LIDODERM) 5 %; Place 1 patch onto the skin daily. Remove & Discard patch within 12 hours or as directed by MD  Dispense: 30 patch; Refill: 0  2. Erectile dysfunction, unspecified erectile dysfunction type -Sildenafil was refilled. - sildenafil (VIAGRA) 25 MG tablet; Take 1 tablet (25 mg total) by mouth daily as needed for erectile dysfunction.  Dispense: 30 tablet; Refill: 0    Follow-up: Return in about 4 weeks (around 08/05/2020), or if symptoms worsen or fail to improve.    Roseanne Juenger Jerold Coombe, NP

## 2020-07-13 ENCOUNTER — Ambulatory Visit: Payer: Self-pay | Admitting: Specialist

## 2020-07-14 ENCOUNTER — Telehealth: Payer: Self-pay

## 2020-07-14 NOTE — Telephone Encounter (Signed)
Confirmed patient has active medical and Rx coverage through [employer] (Per rep, active since 03/31/20, ID Number: [id number]).  No longer eligible for [facility] services.  Left message with patient encouraging callback to discuss.  Will cover for 30 days for visits and refill request to allow time to establish with another provider.  No further visits after 08/14/2020.

## 2020-07-18 ENCOUNTER — Other Ambulatory Visit: Payer: Self-pay | Admitting: Gerontology

## 2020-07-18 DIAGNOSIS — E119 Type 2 diabetes mellitus without complications: Secondary | ICD-10-CM

## 2020-07-18 DIAGNOSIS — E781 Pure hyperglyceridemia: Secondary | ICD-10-CM

## 2020-07-19 ENCOUNTER — Other Ambulatory Visit: Payer: Self-pay | Admitting: Gerontology

## 2020-07-19 DIAGNOSIS — I10 Essential (primary) hypertension: Secondary | ICD-10-CM

## 2020-07-30 ENCOUNTER — Other Ambulatory Visit: Payer: Self-pay | Admitting: Gerontology

## 2020-07-30 DIAGNOSIS — I10 Essential (primary) hypertension: Secondary | ICD-10-CM

## 2020-08-05 ENCOUNTER — Ambulatory Visit: Payer: Self-pay | Admitting: Gerontology

## 2020-09-07 ENCOUNTER — Other Ambulatory Visit: Payer: Self-pay | Admitting: Gerontology

## 2020-09-07 DIAGNOSIS — E119 Type 2 diabetes mellitus without complications: Secondary | ICD-10-CM

## 2020-09-14 ENCOUNTER — Other Ambulatory Visit: Payer: Self-pay

## 2020-09-14 ENCOUNTER — Encounter: Payer: Self-pay | Admitting: Gerontology

## 2020-09-14 ENCOUNTER — Ambulatory Visit: Payer: Self-pay | Admitting: Gerontology

## 2020-09-14 VITALS — BP 136/92 | HR 81 | Temp 97.9°F | Resp 18 | Ht 69.0 in | Wt 218.4 lb

## 2020-09-14 DIAGNOSIS — Z0000 Encounter for general adult medical examination without abnormal findings: Secondary | ICD-10-CM

## 2020-09-14 DIAGNOSIS — N529 Male erectile dysfunction, unspecified: Secondary | ICD-10-CM

## 2020-09-14 DIAGNOSIS — I10 Essential (primary) hypertension: Secondary | ICD-10-CM

## 2020-09-14 DIAGNOSIS — E781 Pure hyperglyceridemia: Secondary | ICD-10-CM

## 2020-09-14 MED ORDER — ATORVASTATIN CALCIUM 10 MG PO TABS: 1.0000 | ORAL_TABLET | Freq: Every day | ORAL | 1 refills | Status: DC

## 2020-09-14 MED ORDER — SILDENAFIL CITRATE 25 MG PO TABS: 25.0000 mg | ORAL_TABLET | Freq: Every day | ORAL | 0 refills | Status: DC | PRN

## 2020-09-14 MED ORDER — ATENOLOL 50 MG PO TABS: 50.0000 mg | ORAL_TABLET | Freq: Every day | ORAL | 1 refills | Status: DC

## 2020-09-14 MED ORDER — ATENOLOL 50 MG PO TABS: 50.0000 mg | ORAL_TABLET | Freq: Every day | ORAL | 2 refills | Status: DC

## 2020-09-14 MED ORDER — AMLODIPINE BESYLATE 10 MG PO TABS: 10.0000 mg | ORAL_TABLET | Freq: Every day | ORAL | 2 refills | Status: DC

## 2020-09-14 MED ORDER — AMLODIPINE BESYLATE 10 MG PO TABS: 10.0000 mg | ORAL_TABLET | Freq: Every day | ORAL | 1 refills | Status: DC

## 2020-09-14 MED ORDER — TRIAMTERENE-HCTZ 37.5-25 MG PO CAPS
ORAL_CAPSULE | ORAL | 2 refills | Status: DC
Start: 2020-09-14 — End: 2021-01-11

## 2020-09-14 MED ORDER — ATORVASTATIN CALCIUM 10 MG PO TABS: 1.0000 | ORAL_TABLET | Freq: Every day | ORAL | 2 refills | Status: DC

## 2020-09-14 NOTE — Progress Notes (Signed)
Established Patient Office Visit  Subjective:  Patient ID: Christopher Harrell, male    DOB: 1971-01-26  Age: 50 y.o. MRN: [id number]  CC:  Chief Complaint  Patient presents with  . Follow-up  . Hypertension  . Medication Refill    Out of meds x 1 week    HPI Christopher Harrell is 50 y/o male who has history of Hypertension, Hyperlipidemia, smoking, presents for routine follow up and medication refill. He states that he's compliant with his medications, though was out of some of his blood pressure medication for one week. He doesn't check his blood pressure at home, continues to smoke 1 pack of cigarette daily and admits the desire to quit. He denies headache, chest pain, palpitation, light headedness and vision changes. He also states that using Viagra is effective and requests refill, though has not seen by a Urologist. He also requests Colonoscopy screening. Overall, he states that he's doing well and offers no further complaint.  Past Medical History:  Diagnosis Date  . Community acquired pneumonia    a. Dx 01/21/2016 - LLL PNA.  . Diabetes mellitus without complication ([place])   . ED (erectile dysfunction)   . Essential hypertension   . Hyperlipidemia   . Tobacco abuse     Past Surgical History:  Procedure Laterality Date  . NO PAST SURGERIES      Family History  Problem Relation Age of Onset  . Hypertension Mother   . Lung cancer Mother   . Hyperlipidemia Mother   . Heart attack Father 50  . Heart attack Maternal Grandmother     Social History   Socioeconomic History  . Marital status: Legally Separated    Spouse name: Not on file  . Number of children: Not on file  . Years of education: Not on file  . Highest education level: Not on file  Occupational History  . Occupation: Picture framer    Comment: [employer]  Tobacco Use  . Smoking status: Current Every Day Smoker    Packs/day: 0.50    Years: 20.00    Pack years: 10.00    Types: Cigarettes  . Smokeless  tobacco: Never Used  . Tobacco comment: Tried gum.  Vaping Use  . Vaping Use: Never used  Substance and Sexual Activity  . Alcohol use: Not Currently  . Drug use: No  . Sexual activity: Yes    Birth control/protection: None  Other Topics Concern  . Not on file  Social History Narrative   No longer living with wife. Walking for 20 mins per day   Social Determinants of Health   Financial Resource Strain: Not on file  Food Insecurity: No Food Insecurity  . Worried About Charity fundraiser in the Last Year: Never true  . Ran Out of Food in the Last Year: Never true  Transportation Needs: No Transportation Needs  . Lack of Transportation (Medical): No  . Lack of Transportation (Non-Medical): No  Physical Activity: Not on file  Stress: Not on file  Social Connections: Not on file  Intimate Partner Violence: Not on file    Outpatient Medications Prior to Visit  Medication Sig Dispense Refill  . albuterol (VENTOLIN HFA) 108 (90 Base) MCG/ACT inhaler Inhale 2 puffs into the lungs every 6 (six) hours as needed for wheezing or shortness of breath. 1 each 1  . aspirin EC 81 MG tablet Take 1 tablet (81 mg total) by mouth daily. 30 tablet 3  . ibuprofen (ADVIL) 800 MG tablet  Take 1 tablet (800 mg total) by mouth in the morning and at bedtime. 30 tablet 0  . metFORMIN (GLUCOPHAGE) 500 MG tablet TAKE 1 TABLET (500 MG TOTAL) BY MOUTH DAILY. 30 tablet 1  . Multiple Vitamin (MULTIVITAMIN WITH MINERALS) TABS tablet Take 1 tablet by mouth daily.    . sildenafil (VIAGRA) 25 MG tablet Take 1 tablet (25 mg total) by mouth daily as needed for erectile dysfunction. 30 tablet 0  . triamterene-hydrochlorothiazide (DYAZIDE) 37.5-25 MG capsule TAKE 1 CAPSULE BY MOUTH EVERY DAY 30 capsule 1  . amLODipine (NORVASC) 10 MG tablet Take 1 tablet (10 mg total) by mouth daily. (Patient not taking: Reported on 09/14/2020) 30 tablet 1  . atenolol (TENORMIN) 50 MG tablet Take 1 tablet (50 mg total) by mouth daily.  (Patient not taking: Reported on 09/14/2020) 30 tablet 1  . atorvastatin (LIPITOR) 10 MG tablet TAKE 1 TABLET BY MOUTH EVERY DAY (Patient not taking: Reported on 09/14/2020) 30 tablet 1  . cyclobenzaprine (FLEXERIL) 10 MG tablet Take 1 tablet (10 mg total) by mouth every 8 (eight) hours as needed for muscle spasms. 30 tablet 0  . lidocaine (LIDODERM) 5 % Place 1 patch onto the skin daily. Remove & Discard patch within 12 hours or as directed by MD (Patient not taking: Reported on 09/14/2020) 30 patch 0  . predniSONE (DELTASONE) 20 MG tablet Take 1 tablet (20 mg total) by mouth daily with breakfast. 5 tablet 0   No facility-administered medications prior to visit.    No Known Allergies  ROS Review of Systems  Constitutional: Negative.   Eyes: Negative.   Respiratory: Negative.   Cardiovascular: Negative.   Skin: Negative.   Neurological: Negative.   Psychiatric/Behavioral: Negative.       Objective:    Physical Exam HENT:     Head: Normocephalic and atraumatic.  Eyes:     Extraocular Movements: Extraocular movements intact.     Conjunctiva/sclera: Conjunctivae normal.     Pupils: Pupils are equal, round, and reactive to light.  Cardiovascular:     Rate and Rhythm: Normal rate and regular rhythm.     Pulses: Normal pulses.     Heart sounds: Normal heart sounds.  Pulmonary:     Effort: Pulmonary effort is normal.     Breath sounds: Normal breath sounds.  Neurological:     General: No focal deficit present.     Mental Status: He is alert and oriented to person, place, and time. Mental status is at baseline.  Psychiatric:        Mood and Affect: Mood normal.        Behavior: Behavior normal.        Thought Content: Thought content normal.        Judgment: Judgment normal.     BP (!) 136/92 (BP Location: Left Arm, Patient Position: Sitting, Cuff Size: Large)   Pulse 81   Temp 97.9 F (36.6 C)   Resp 18   Ht 5\' 9"  (1.753 m)   Wt 218 lb 6.4 oz (99.1 kg)   SpO2 95%   BMI  32.25 kg/m  Wt Readings from Last 3 Encounters:  09/14/20 218 lb 6.4 oz (99.1 kg)  07/08/20 214 lb 1.6 oz (97.1 kg)  06/10/20 215 lb (97.5 kg)   Weight loss encouraged  Health Maintenance Due  Topic Date Due  . Hepatitis C Screening  Never done  . TETANUS/TDAP  Never done  . COLONOSCOPY (Pts 45-27yrs Insurance coverage will need to  be confirmed)  Never done  . COVID-19 Vaccine (3 - Booster for Pfizer series) 01/23/2020    There are no preventive care reminders to display for this patient.  Lab Results  Component Value Date   TSH 1.110 06/13/2016   Lab Results  Component Value Date   WBC 4.8 01/29/2020   HGB 16.6 01/29/2020   HCT 44.5 01/29/2020   MCV 83.8 01/29/2020   PLT 267 01/29/2020   Lab Results  Component Value Date   NA 140 05/27/2020   K 4.0 05/27/2020   CO2 25 05/27/2020   GLUCOSE 95 05/27/2020   BUN 16 05/27/2020   CREATININE 1.04 05/27/2020   BILITOT 0.9 05/27/2020   ALKPHOS 68 05/27/2020   AST 18 05/27/2020   ALT 37 05/27/2020   PROT 7.2 05/27/2020   ALBUMIN 4.7 05/27/2020   CALCIUM 9.9 05/27/2020   ANIONGAP 10 01/29/2020   Lab Results  Component Value Date   CHOL 239 (H) 10/08/2019   Lab Results  Component Value Date   HDL 39 (L) 10/08/2019   Lab Results  Component Value Date   LDLCALC 135 (H) 10/08/2019   Lab Results  Component Value Date   TRIG 360 (H) 10/08/2019   Lab Results  Component Value Date   CHOLHDL 6.1 (H) 10/08/2019   Lab Results  Component Value Date   HGBA1C 5.4 05/27/2020      Assessment & Plan:   1. Essential hypertension - His blood pressure is under control, he will continue on current medication, DASH diet and exercise as tolerated. - triamterene-hydrochlorothiazide (DYAZIDE) 37.5-25 MG capsule; TAKE 1 CAPSULE BY MOUTH EVERY DAY  Dispense: 30 capsule; Refill: 2 - amLODipine (NORVASC) 10 MG tablet; Take 1 tablet (10 mg total) by mouth daily.  Dispense: 30 tablet; Refill: 2 - atenolol (TENORMIN) 50 MG  tablet; Take 1 tablet (50 mg total) by mouth daily.  Dispense: 30 tablet; Refill: 2  2. Hypertriglyceridemia - He will continue on current medication , low fat/cholesterol diet and exercise as tolerated. - atorvastatin (LIPITOR) 10 MG tablet; Take 1 tablet (10 mg total) by mouth daily.  Dispense: 30 tablet; Refill: 2  3. Erectile dysfunction, unspecified erectile dysfunction type - He will continue on Viagra and follow up with Urology. - Ambulatory referral to Urology - sildenafil (VIAGRA) 25 MG tablet; Take 1 tablet (25 mg total) by mouth daily as needed for erectile dysfunction.  Dispense: 15 tablet; Refill: 0  4. Health care maintenance - Routine labs will be checked and referral to Gastroenterology for Colonoscopy screening. - HgB A1c; Future - Vitamin D (25 hydroxy); Future - Ambulatory referral to Gastroenterology     Follow-up: Return in about 3 months (around 12/08/2020), or if symptoms worsen or fail to improve.    Leverne Tessler Jerold Coombe, NP

## 2020-09-14 NOTE — Patient Instructions (Signed)

## 2020-09-15 ENCOUNTER — Other Ambulatory Visit: Payer: Self-pay

## 2020-09-15 MED ORDER — SILDENAFIL CITRATE 25 MG PO TABS
25.0000 mg | ORAL_TABLET | Freq: Every day | ORAL | 0 refills | Status: DC | PRN
  Filled 2020-09-15: qty 9, 30d supply, fill #0

## 2020-09-21 ENCOUNTER — Ambulatory Visit: Payer: Self-pay | Admitting: Gerontology

## 2020-09-22 ENCOUNTER — Other Ambulatory Visit: Payer: Self-pay | Admitting: Gerontology

## 2020-09-22 NOTE — Telephone Encounter (Signed)
Patient holding for now. Will address at next OV with A1C.

## 2020-10-01 ENCOUNTER — Telehealth: Payer: Self-pay

## 2020-10-01 ENCOUNTER — Other Ambulatory Visit: Payer: Self-pay

## 2020-10-01 MED ORDER — PEG 3350-KCL-NA BICARB-NACL 420 G PO SOLR: ORAL | 0 refills | Status: DC

## 2020-10-01 NOTE — Telephone Encounter (Signed)
Gastroenterology Pre-Procedure Review  Request Date: 10/11/20 Requesting Physician: Dr. Burke Keels  PATIENT REVIEW QUESTIONS: The patient responded to the following health history questions as indicated:    1. Are you having any GI issues? no 2. Do you have a personal history of Polyps? no 3. Do you have a family history of Colon Cancer or Polyps? no 4. Diabetes Mellitus? yes (Type 2) 5. Joint replacements in the past 12 months?no 6. Major health problems in the past 3 months?no 7. Any artificial heart valves, MVP, or defibrillator?no    MEDICATIONS & ALLERGIES:    Patient reports the following regarding taking any anticoagulation/antiplatelet therapy:   Plavix, Coumadin, Eliquis, Xarelto, Lovenox, Pradaxa, Brilinta, or Effient? no Aspirin? no  Patient confirms/reports the following medications:  Current Outpatient Medications  Medication Sig Dispense Refill  . polyethylene glycol-electrolytes (GAVILYTE-N WITH FLAVOR PACK) 420 g solution Drink one 8 oz glass every 20 mins until entire container is finished staring at 5:00pm on 10/10/20 4000 mL 0  . albuterol (VENTOLIN HFA) 108 (90 Base) MCG/ACT inhaler Inhale 2 puffs into the lungs every 6 (six) hours as needed for wheezing or shortness of breath. 1 each 1  . amLODipine (NORVASC) 10 MG tablet Take 1 tablet (10 mg total) by mouth daily. 30 tablet 2  . aspirin EC 81 MG tablet Take 1 tablet (81 mg total) by mouth daily. 30 tablet 3  . atenolol (TENORMIN) 50 MG tablet Take 1 tablet (50 mg total) by mouth daily. 30 tablet 2  . atorvastatin (LIPITOR) 10 MG tablet Take 1 tablet (10 mg total) by mouth daily. 30 tablet 2  . ibuprofen (ADVIL) 800 MG tablet Take 1 tablet (800 mg total) by mouth in the morning and at bedtime. 30 tablet 0  . metFORMIN (GLUCOPHAGE) 500 MG tablet TAKE 1 TABLET (500 MG TOTAL) BY MOUTH DAILY. 30 tablet 1  . Multiple Vitamin (MULTIVITAMIN WITH MINERALS) TABS tablet Take 1 tablet by mouth daily.    . sildenafil (VIAGRA) 25  MG tablet Take 1 tablet (25 mg total) by mouth daily as needed for erectile dysfunction. 15 tablet 0  . sildenafil (VIAGRA) 25 MG tablet Take 1 tablet (25 mg total) by mouth daily as needed for erectile dysfunction 15 tablet 0  . triamterene-hydrochlorothiazide (DYAZIDE) 37.5-25 MG capsule TAKE 1 CAPSULE BY MOUTH EVERY DAY 30 capsule 2   No current facility-administered medications for this visit.    Patient confirms/reports the following allergies:  No Known Allergies  No orders of the defined types were placed in this encounter.   AUTHORIZATION INFORMATION Primary Insurance: 1D#: Group #:  Secondary Insurance: 1D#: Group #:  SCHEDULE INFORMATION: Date:  Time: Location:

## 2020-10-07 ENCOUNTER — Other Ambulatory Visit: Payer: Self-pay

## 2020-10-07 ENCOUNTER — Encounter: Payer: Self-pay | Admitting: Urology

## 2020-10-07 ENCOUNTER — Ambulatory Visit (INDEPENDENT_AMBULATORY_CARE_PROVIDER_SITE_OTHER): Payer: Self-pay | Admitting: Urology

## 2020-10-07 VITALS — BP 133/89 | HR 68 | Ht 69.0 in | Wt 215.0 lb

## 2020-10-07 DIAGNOSIS — N529 Male erectile dysfunction, unspecified: Secondary | ICD-10-CM

## 2020-10-07 MED ORDER — TADALAFIL 5 MG PO TABS: 5.0000 mg | ORAL_TABLET | Freq: Every day | ORAL | 11 refills | Status: DC | PRN

## 2020-10-07 NOTE — Progress Notes (Signed)
   10/07/20 3:30 PM   Christopher Harrell 1971-04-07 [id number]  CC: Erectile dysfunction  HPI: 50 year old male with 2 to 3 years of erectile dysfunction.  Comorbidities include hypertension and diabetes.  No testosterone values to review.  He previously has tried Viagra 25 mg on demand with good results.  He denies any family history of prostate cancer.  PSA was normal at 1.1 in February 2021.  PMH: Past Medical History:  Diagnosis Date   Community acquired pneumonia    a. Dx 01/21/2016 - LLL PNA.   Diabetes mellitus without complication ([place])    ED (erectile dysfunction)    Essential hypertension    Hyperlipidemia    Tobacco abuse     Surgical History: Past Surgical History:  Procedure Laterality Date   NO PAST SURGERIES        Family History: Family History  Problem Relation Age of Onset   Hypertension Mother    Lung cancer Mother    Hyperlipidemia Mother    Heart attack Father 77   Heart attack Maternal Grandmother     Social History:  reports that he has been smoking cigarettes. He has a 10.00 pack-year smoking history. He has never used smokeless tobacco. He reports previous alcohol use. He reports that he does not use drugs.  Physical Exam: BP 133/89   Pulse 68   Ht 5\' 9"  (1.753 m)   Wt 215 lb (97.5 kg)   BMI 31.75 kg/m    Constitutional:  Alert and oriented, No acute distress. Cardiovascular: No clubbing, cyanosis, or edema. Respiratory: Normal respiratory effort, no increased work of breathing. GI: Abdomen is soft, nontender, nondistended, no abdominal masses   Laboratory Data: Reviewed, see HPI   Assessment & Plan:   50 year old male with erectile dysfunction.  We discussed his risk factors at length, and the risk and benefits of PDE 5 inhibitors.  We discussed the risks and benefits of Cialis versus Viagra, and he is interested in a trial of Cialis 5 to 10 mg on demand.  Side effects discussed.  We also discussed could consider checking a  testosterone in the future if he has ED refractory to PDE 5 inhibitors.  Trial of Cialis 5 to 10 mg on demand for ED, good Rx coupon provided   Christopher Madrid, MD 10/07/2020  [facility] [address], [place], [place] [address] [phone]

## 2020-10-07 NOTE — Patient Instructions (Signed)
Tadalafil tablets (Cialis) What is this medicine? TADALAFIL (tah DA la fil) is used to treat erection problems in men. It is also used for enlargement of the prostate gland in men, a condition called benign prostatic hyperplasia or BPH. This medicine improves urine flow and reduces BPH symptoms. This medicine can also treat both erection problems and BPH when they occur together. This medicine may be used for other purposes; ask your health care provider or pharmacist if you have questions. COMMON BRAND NAME(S): Kathaleen Bury, Cialis What should I tell my health care provider before I take this medicine? They need to know if you have any of these conditions: bleeding disorders eye or vision problems, including a rare inherited eye disease called retinitis pigmentosa anatomical deformation of the penis, Peyronie's disease, or history of priapism (painful and prolonged erection) heart disease, angina, a history of heart attack, irregular heart beats, or other heart problems high or low blood pressure history of blood diseases, like sickle cell anemia or leukemia history of stomach bleeding kidney disease liver disease stroke an unusual or allergic reaction to tadalafil, other medicines, foods, dyes, or preservatives pregnant or trying to get pregnant breast-feeding How should I use this medicine? Take this medicine by mouth with a glass of water. Follow the directions on the prescription label. You may take this medicine with or without meals. When this medicine is used for erection problems, your doctor may prescribe it to be taken once daily or as needed. If you are taking the medicine as needed, you may be able to have sexual activity 30 minutes after taking it and for up to 36 hours after taking it. Whether you are taking the medicine as needed or once daily, you should not take more than one dose per day. If you are taking this medicine for symptoms of benign prostatic hyperplasia (BPH) or to  treat both BPH and an erection problem, take the dose once daily at about the same time each day. Do not take your medicine more often than directed. Talk to your pediatrician regarding the use of this medicine in children. Special care may be needed. Overdosage: If you think you have taken too much of this medicine contact a poison control center or emergency room at once. NOTE: This medicine is only for you. Do not share this medicine with others. What if I miss a dose? If you are taking this medicine as needed for erection problems, this does not apply. If you miss a dose while taking this medicine once daily for an erection problem, benign prostatic hyperplasia, or both, take it as soon as you remember, but do not take more than one dose per day. What may interact with this medicine? Do not take this medicine with any of the following medications: nitrates like amyl nitrite, isosorbide dinitrate, isosorbide mononitrate, nitroglycerin other medicines for erectile dysfunction like avanafil, sildenafil, vardenafil other tadalafil products (Adcirca) riociguat This medicine may also interact with the following medications: certain drugs for high blood pressure certain drugs for the treatment of HIV infection or AIDS certain drugs used for fungal or yeast infections, like fluconazole, itraconazole, ketoconazole, and voriconazole certain drugs used for seizures like carbamazepine, phenytoin, and phenobarbital grapefruit juice macrolide antibiotics like clarithromycin, erythromycin, troleandomycin medicines for prostate problems rifabutin, rifampin or rifapentine This list may not describe all possible interactions. Give your health care provider a list of all the medicines, herbs, non-prescription drugs, or dietary supplements you use. Also tell them if you smoke, drink alcohol,  or use illegal drugs. Some items may interact with your medicine. What should I watch for while using this medicine? If  you notice any changes in your vision while taking this drug, call your doctor or health care professional as soon as possible. Stop using this medicine and call your health care provider right away if you have a loss of sight in one or both eyes. Contact your doctor or health care professional right away if the erection lasts longer than 4 hours or if it becomes painful. This may be a sign of serious problem and must be treated right away to prevent permanent damage. If you experience symptoms of nausea, dizziness, chest pain or arm pain upon initiation of sexual activity after taking this medicine, you should refrain from further activity and call your doctor or health care professional as soon as possible. Do not drink alcohol to excess (examples, 5 glasses of wine or 5 shots of whiskey) when taking this medicine. When taken in excess, alcohol can increase your chances of getting a headache or getting dizzy, increasing your heart rate or lowering your blood pressure. Using this medicine does not protect you or your partner against HIV infection (the virus that causes AIDS) or other sexually transmitted diseases. What side effects may I notice from receiving this medicine? Side effects that you should report to your doctor or health care professional as soon as possible: allergic reactions like skin rash, itching or hives, swelling of the face, lips, or tongue breathing problems changes in hearing changes in vision chest pain fast, irregular heartbeat prolonged or painful erection seizures Side effects that usually do not require medical attention (report to your doctor or health care professional if they continue or are bothersome): back pain dizziness flushing headache indigestion muscle aches nausea stuffy or runny nose This list may not describe all possible side effects. Call your doctor for medical advice about side effects. You may report side effects to FDA at 1-800-FDA-1088. Where  should I keep my medicine? Keep out of the reach of children. Store at room temperature between 15 and 30 degrees C (59 and 86 degrees F). Throw away any unused medicine after the expiration date. NOTE: This sheet is a summary. It may not cover all possible information. If you have questions about this medicine, talk to your doctor, pharmacist, or health care provider.  2021 Elsevier/Gold Standard (2013-09-05 13:15:49)

## 2020-10-08 ENCOUNTER — Other Ambulatory Visit: Payer: Self-pay

## 2020-10-11 ENCOUNTER — Telehealth: Payer: Self-pay | Admitting: Gastroenterology

## 2020-10-11 ENCOUNTER — Ambulatory Visit: Admission: RE | Admit: 2020-10-11 | Payer: Self-pay | Source: Ambulatory Visit | Admitting: Gastroenterology

## 2020-10-11 ENCOUNTER — Encounter: Admission: RE | Payer: Self-pay | Source: Ambulatory Visit

## 2020-10-11 SURGERY — COLONOSCOPY
Anesthesia: General

## 2020-10-11 NOTE — Telephone Encounter (Signed)
Callback

## 2020-10-28 ENCOUNTER — Emergency Department
Admission: EM | Admit: 2020-10-28 | Discharge: 2020-10-28 | Disposition: A | Discharge status: Home / Self Care | Payer: Self-pay | Attending: Emergency Medicine | Admitting: Emergency Medicine

## 2020-10-28 ENCOUNTER — Other Ambulatory Visit: Payer: Self-pay

## 2020-10-28 DIAGNOSIS — Z7982 Long term (current) use of aspirin: Secondary | ICD-10-CM | POA: Insufficient documentation

## 2020-10-28 DIAGNOSIS — Z7984 Long term (current) use of oral hypoglycemic drugs: Secondary | ICD-10-CM | POA: Insufficient documentation

## 2020-10-28 DIAGNOSIS — Y990 Civilian activity done for income or pay: Secondary | ICD-10-CM | POA: Insufficient documentation

## 2020-10-28 DIAGNOSIS — E1169 Type 2 diabetes mellitus with other specified complication: Secondary | ICD-10-CM | POA: Insufficient documentation

## 2020-10-28 DIAGNOSIS — S39012A Strain of muscle, fascia and tendon of lower back, initial encounter: Secondary | ICD-10-CM | POA: Insufficient documentation

## 2020-10-28 DIAGNOSIS — I10 Essential (primary) hypertension: Secondary | ICD-10-CM | POA: Insufficient documentation

## 2020-10-28 DIAGNOSIS — J449 Chronic obstructive pulmonary disease, unspecified: Secondary | ICD-10-CM | POA: Insufficient documentation

## 2020-10-28 DIAGNOSIS — Z79899 Other long term (current) drug therapy: Secondary | ICD-10-CM | POA: Insufficient documentation

## 2020-10-28 DIAGNOSIS — E785 Hyperlipidemia, unspecified: Secondary | ICD-10-CM | POA: Insufficient documentation

## 2020-10-28 DIAGNOSIS — X500XXA Overexertion from strenuous movement or load, initial encounter: Secondary | ICD-10-CM | POA: Insufficient documentation

## 2020-10-28 DIAGNOSIS — F17210 Nicotine dependence, cigarettes, uncomplicated: Secondary | ICD-10-CM | POA: Insufficient documentation

## 2020-10-28 MED ORDER — IBUPROFEN 600 MG PO TABS
600.0000 mg | ORAL_TABLET | Freq: Once | ORAL | Status: AC
  Administered 2020-10-28: 600 mg via ORAL
  Filled 2020-10-28: qty 1

## 2020-10-28 MED ORDER — LIDOCAINE 5 % EX PTCH: 1.0000 | MEDICATED_PATCH | Freq: Two times a day (BID) | CUTANEOUS | 0 refills | Status: DC

## 2020-10-28 MED ORDER — IBUPROFEN 600 MG PO TABS: ORAL_TABLET | ORAL | 0 refills | Status: DC

## 2020-10-28 MED ORDER — LIDOCAINE 5 % EX PTCH
1.0000 | MEDICATED_PATCH | CUTANEOUS | Status: DC
  Administered 2020-10-28: 1 via TRANSDERMAL
  Filled 2020-10-28: qty 1

## 2020-10-28 MED ORDER — CYCLOBENZAPRINE HCL 5 MG PO TABS: 5.0000 mg | ORAL_TABLET | Freq: Three times a day (TID) | ORAL | 0 refills | Status: DC | PRN

## 2020-10-28 MED ORDER — CYCLOBENZAPRINE HCL 10 MG PO TABS
5.0000 mg | ORAL_TABLET | Freq: Once | ORAL | Status: AC
  Administered 2020-10-28: 5 mg via ORAL
  Filled 2020-10-28: qty 1

## 2020-10-28 NOTE — Discharge Instructions (Addendum)
You were evaluated in the Emergency Department today for back pain. Your evaluation suggests no acute abnormalities which require further intervention at this time.   - Move around as tolerated but avoiding heavy lifting. "Bed rest" is not recommended nor is it the best treatment for low back pain.  - Medications will help control your discomfort:  -- Ibuprofen (800 mg every 8 hours for pain, with food).  -- Any other prescriptions you were provided as per label instructions  -- Do not drink alcohol, drive a car, operate machinery, or get up on ladders or heights when taking any prescribed pain medications.  -- Do not drive home if you received prescribed pain medications here in the ED.  Please follow up with your primary care physician as needed or any other providers listed in this paperwork. If you do not have a primary doctor, you can call your insurance company to find one.  If you do not have insurance, you can go to the finance/registration department for more assistance.  Return to the ED immediately if you develop any of the following problems: -- Leaking urine or difficulty urinating; -- Inability to control your bowels; -- New numbness or weakness in your legs or numbness between your legs; -- Inability to walk -- Fever

## 2020-10-28 NOTE — ED Triage Notes (Signed)
Pt ambulatory to triage w/ c/o low back pain. Pt states started 5 days ago after lifting boxes at work. NAD noted, MD present to assess in triage.

## 2020-10-28 NOTE — ED Provider Notes (Signed)
[facility] Emergency Department Provider Note  ____________________________________________   Event Date/Time   First MD Initiated Contact with Patient 10/28/20 [phone]     (approximate)  I have reviewed the triage vital signs and the nursing notes.   HISTORY  Chief Complaint Back Pain    HPI Christopher Harrell is a 50 y.o. male who presents for evaluation of several days of worsening bilateral low back pain.  He started a new job that requires a lot of pulling and lifting.  Shortly after he started, he remembers 1 particular moment where he pulled hard on a box and then both sides of his lower back started hurting.  He has been gradually getting worse over the last few days as he continues to work.  It does not radiate down his legs and there is no numbness, tingling, nor weakness.  He has had no urinary difficulties including no bladder incontinence, urinary retention, nor bowel incontinence.  Nothing in particular makes his symptoms better.  He denies fever, sore throat, chest pain, shortness of breath, nausea, and vomiting.     Past Medical History:  Diagnosis Date   Community acquired pneumonia    a. Dx 01/21/2016 - LLL PNA.   Diabetes mellitus without complication [facility])    ED (erectile dysfunction)    Essential hypertension    Hyperlipidemia    Tobacco abuse     Patient Active Problem List   Diagnosis Date Noted   Chronic midline low back pain with bilateral sciatica 06/10/2020   Back pain 05/13/2020   Essential hypertension 08/13/2019   Erectile dysfunction 06/25/2019   Acute flank pain 06/25/2019   Tooth abscess 04/30/2019   Smoking 04/30/2019   Hypertriglyceridemia 06/05/2017   COPD with acute exacerbation ([place]) 10/14/2016   Pneumonia 10/14/2016   Hypertensive urgency, malignant 06/13/2016   Tobacco abuse 06/13/2016   Community acquired pneumonia 01/23/2016   Elevated troponin 01/23/2016   HTN (hypertension) 01/23/2016   Chest pain  01/23/2016    Past Surgical History:  Procedure Laterality Date   NO PAST SURGERIES      Prior to Admission medications   Medication Sig Start Date End Date Taking? Authorizing Provider  cyclobenzaprine (FLEXERIL) 5 MG tablet Take 1 tablet (5 mg total) by mouth 3 (three) times daily as needed for muscle spasms. 10/28/20  Yes Hinda Kehr, MD  ibuprofen (ADVIL) 600 MG tablet Take 1 tablet by mouth three times daily with meals 10/28/20  Yes Hinda Kehr, MD  lidocaine (LIDODERM) 5 % Place 1 patch onto the skin every 12 (twelve) hours. Remove & Discard patch within 12 hours or as directed by MD 10/28/20 10/28/21 Yes Hinda Kehr, MD  albuterol (VENTOLIN HFA) 108 (90 Base) MCG/ACT inhaler Inhale 2 puffs into the lungs every 6 (six) hours as needed for wheezing or shortness of breath. 0/73/71   Copland, Elmo Putt B, PA-C  amLODipine (NORVASC) 10 MG tablet Take 1 tablet (10 mg total) by mouth daily. 09/14/20   Iloabachie, Chioma E, NP  aspirin EC 81 MG tablet Take 1 tablet (81 mg total) by mouth daily. 07/16/19   Iloabachie, Chioma E, NP  atenolol (TENORMIN) 50 MG tablet Take 1 tablet (50 mg total) by mouth daily. 09/14/20 09/14/21  Iloabachie, Chioma E, NP  atorvastatin (LIPITOR) 10 MG tablet Take 1 tablet (10 mg total) by mouth daily. 09/14/20   Iloabachie, Chioma E, NP  metFORMIN (GLUCOPHAGE) 500 MG tablet TAKE 1 TABLET (500 MG TOTAL) BY MOUTH DAILY. 06/22/20   Iloabachie,  Chioma E, NP  Multiple Vitamin (MULTIVITAMIN WITH MINERALS) TABS tablet Take 1 tablet by mouth daily.    [provider]  polyethylene glycol-electrolytes (GAVILYTE-N WITH FLAVOR PACK) 420 g solution Drink one 8 oz glass every 20 mins until entire container is finished staring at 5:00pm on 10/10/20 10/01/20   Virgel Manifold, MD  tadalafil (CIALIS) 5 MG tablet Take 1-2 tablets (5-10 mg total) by mouth daily as needed for erectile dysfunction. 10/07/20   Billey Co, MD  triamterene-hydrochlorothiazide (DYAZIDE) 37.5-25 MG  capsule TAKE 1 CAPSULE BY MOUTH EVERY DAY 09/14/20   Iloabachie, Chioma E, NP    Allergies Patient has no known allergies.  Family History  Problem Relation Age of Onset   Hypertension Mother    Lung cancer Mother    Hyperlipidemia Mother    Heart attack Father 81   Heart attack Maternal Grandmother     Social History Social History   Tobacco Use   Smoking status: Every Day    Packs/day: 0.50    Years: 20.00    Pack years: 10.00    Types: Cigarettes   Smokeless tobacco: Never   Tobacco comments:    Tried gum.  Vaping Use   Vaping Use: Never used  Substance Use Topics   Alcohol use: Not Currently   Drug use: No    Review of Systems Constitutional: No fever/chills Cardiovascular: Denies chest pain. Respiratory: Denies shortness of breath. Gastrointestinal: No abdominal pain.  No nausea, no vomiting. Genitourinary: Negative for dysuria.  Negative for urinary retention, negative for urinary incontinence, negative for bowel incontinence. Musculoskeletal: Positive for bilateral lower back pain. Neurological: Negative for headaches, focal weakness or numbness.   ____________________________________________   PHYSICAL EXAM:  VITAL SIGNS: ED Triage Vitals  Enc Vitals Group     BP 10/28/20 0554 127/89     Pulse Rate 10/28/20 0554 72     Resp 10/28/20 0554 16     Temp 10/28/20 0554 98.1 F (36.7 C)     Temp Source 10/28/20 0554 Oral     SpO2 10/28/20 0554 95 %     Weight 10/28/20 0554 97.5 kg (215 lb)     Height 10/28/20 0554 1.753 m (5\' 9" )     Head Circumference --      Peak Flow --      Pain Score 10/28/20 0603 8     Pain Loc --      Pain Edu? --      Excl. in [place]? --     Constitutional: Alert and oriented.  Eyes: Conjunctivae are normal.  Head: Atraumatic. Nose: No congestion/rhinnorhea. Mouth/Throat: Patient is wearing a mask. Neck: No stridor.  No meningeal signs.   Cardiovascular: Normal rate, regular rhythm. Good peripheral  circulation. Respiratory: Normal respiratory effort.  No retractions. Gastrointestinal: Soft and nontender. No distention.  Musculoskeletal: Ambulatory without difficulty.  No gross deformities of his extremities.  No spinal tenderness to palpation in the thoracic nor lumbar spine.  However he has easily reproducible bilateral soft tissue tenderness in the muscles of his lumbar spine.  No specific point tenderness and it is bilateral with no evidence of a rash to suggest shingles. Neurologic:  Normal speech and language. No gross focal neurologic deficits are appreciated.  Skin:  Skin is warm, dry and intact. Psychiatric: Mood and affect are normal. Speech and behavior are normal.  ____________________________________________    INITIAL IMPRESSION / MDM / [place] / ED COURSE  As part of my medical  decision making, I reviewed the following data within the [place] notes reviewed and incorporated, Old chart reviewed, Notes from prior ED visits, and [place] Controlled Substance Database   Patient's presentation is very consistent with musculoskeletal strain.  No evidence of an emergent back problem such as cauda equina syndrome or other spinal or neurological impingement.  Nothing to suggest osteomyelitis, transverse myelitis, discitis.  I recommended NSAIDs, short course of Flexeril, and appropriate lifting.  Patient says he understands and agrees with plan.  Also providing Lidoderm patch.  I gave my usual and customary return precautions.  ____________________________________________  FINAL CLINICAL IMPRESSION(S) / ED DIAGNOSES  Final diagnoses:  Strain of lumbar region, initial encounter     MEDICATIONS GIVEN DURING THIS VISIT:  Medications  lidocaine (LIDODERM) 5 % 1 patch (1 patch Transdermal Patch Applied 10/28/20 0623)  ibuprofen (ADVIL) tablet 600 mg (600 mg Oral Given 10/28/20 0623)  cyclobenzaprine (FLEXERIL) tablet 5 mg (5 mg Oral Given 10/28/20  2706)     ED Discharge Orders          Ordered    cyclobenzaprine (FLEXERIL) 5 MG tablet  3 times daily PRN        10/28/20 0614    ibuprofen (ADVIL) 600 MG tablet        10/28/20 0614    lidocaine (LIDODERM) 5 %  Every 12 hours        10/28/20 2376             Note:  This document was prepared using Dragon voice recognition software and may include unintentional dictation errors.   Hinda Kehr, MD 10/28/20 [phone]

## 2020-11-04 ENCOUNTER — Ambulatory Visit: Payer: Self-pay | Admitting: Physician Assistant

## 2020-11-05 ENCOUNTER — Encounter: Payer: Self-pay | Admitting: Physician Assistant

## 2020-11-10 ENCOUNTER — Other Ambulatory Visit: Payer: Self-pay

## 2020-12-01 ENCOUNTER — Other Ambulatory Visit: Payer: Self-pay

## 2020-12-01 DIAGNOSIS — Z0000 Encounter for general adult medical examination without abnormal findings: Secondary | ICD-10-CM

## 2020-12-02 LAB — SPECIMEN STATUS

## 2020-12-06 LAB — VITAMIN D 25 HYDROXY (VIT D DEFICIENCY, FRACTURES): Vit D, 25-Hydroxy: 26.4 ng/mL — ABNORMAL LOW (ref 30.0–100.0)

## 2020-12-06 LAB — SPECIMEN STATUS REPORT

## 2020-12-08 ENCOUNTER — Ambulatory Visit: Payer: Self-pay | Admitting: Gerontology

## 2020-12-09 ENCOUNTER — Other Ambulatory Visit: Payer: Self-pay

## 2020-12-09 ENCOUNTER — Ambulatory Visit: Payer: Self-pay | Admitting: Gerontology

## 2020-12-09 ENCOUNTER — Encounter: Payer: Self-pay | Admitting: Gerontology

## 2020-12-09 VITALS — BP 123/90 | HR 86 | Temp 97.2°F | Resp 16 | Ht 69.0 in | Wt 214.1 lb

## 2020-12-09 DIAGNOSIS — I10 Essential (primary) hypertension: Secondary | ICD-10-CM

## 2020-12-09 DIAGNOSIS — M545 Low back pain, unspecified: Secondary | ICD-10-CM

## 2020-12-09 DIAGNOSIS — E559 Vitamin D deficiency, unspecified: Secondary | ICD-10-CM | POA: Insufficient documentation

## 2020-12-09 DIAGNOSIS — Z0000 Encounter for general adult medical examination without abnormal findings: Secondary | ICD-10-CM

## 2020-12-09 MED ORDER — IBUPROFEN 800 MG PO TABS: 800.0000 mg | ORAL_TABLET | Freq: Two times a day (BID) | ORAL | 0 refills | Status: DC

## 2020-12-09 NOTE — Progress Notes (Signed)
Established Patient Office Visit  Subjective:  Patient ID: Christopher Harrell, male    DOB: May 06, 1970  Age: 50 y.o. MRN: [id number]  CC:  Chief Complaint  Patient presents with   Follow-up    Labs done 12/01/20   Hypertension    Patient has been checking his blood pressure at home and states that it has been running 140s/120s. Patient states he is taking his medication as prescribed.    HPI Christopher Harrell  is a 50 y/o male who has history of ED, Hypertension, Hyperlipidemia, Tobacco use, presents for routine follow up and lab review. He states that he's compliant with his medications, denies side effects and continues to make healthy lifestyle changes. He checks his blood pressure at home and it usually runs in the 140/90's. He states that he continues to experience constant lower back pain, describes  pain as sharp, non radiating 7/10. He states that taking 10 mg Flexeril minimally relieves symptoms. He denies bladder/bowel incontinence and saddle anesthesia. His Vitamin D3 was 26.4 ng/ml .Overall, she states that he's doing well and offers no further complaint.  Past Medical History:  Diagnosis Date   Community acquired pneumonia    a. Dx 01/21/2016 - LLL PNA.   Diabetes mellitus without complication ([facility])    ED (erectile dysfunction)    Essential hypertension    Hyperlipidemia    Tobacco abuse     Past Surgical History:  Procedure Laterality Date   NO PAST SURGERIES      Family History  Problem Relation Age of Onset   Hypertension Mother    Lung cancer Mother    Hyperlipidemia Mother    Heart attack Father 59   Heart attack Maternal Grandmother     Social History   Socioeconomic History   Marital status: Legally Separated    Spouse name: Not on file   Number of children: Not on file   Years of education: Not on file   Highest education level: Not on file  Occupational History   Occupation: Picture framer    Comment: [employer]  Tobacco Use   Smoking status:  Every Day    Packs/day: 0.50    Years: 20.00    Pack years: 10.00    Types: Cigarettes   Smokeless tobacco: Never   Tobacco comments:    Tried gum.  Vaping Use   Vaping Use: Never used  Substance and Sexual Activity   Alcohol use: Not Currently   Drug use: No   Sexual activity: Yes    Birth control/protection: None  Other Topics Concern   Not on file  Social History Narrative   No longer living with wife. Walking for 20 mins per day   Social Determinants of Health   Financial Resource Strain: Not on file  Food Insecurity: No Food Insecurity   Worried About Charity fundraiser in the Last Year: Never true   Arboriculturist in the Last Year: Never true  Transportation Needs: No Transportation Needs   Lack of Transportation (Medical): No   Lack of Transportation (Non-Medical): No  Physical Activity: Not on file  Stress: Not on file  Social Connections: Not on file  Intimate Partner Violence: Not on file    Outpatient Medications Prior to Visit  Medication Sig Dispense Refill   albuterol (VENTOLIN HFA) 108 (90 Base) MCG/ACT inhaler Inhale 2 puffs into the lungs every 6 (six) hours as needed for wheezing or shortness of breath. 1 each 1   amLODipine (  NORVASC) 10 MG tablet Take 1 tablet (10 mg total) by mouth daily. 30 tablet 2   atenolol (TENORMIN) 50 MG tablet Take 1 tablet (50 mg total) by mouth daily. 30 tablet 2   atorvastatin (LIPITOR) 10 MG tablet Take 1 tablet (10 mg total) by mouth daily. 30 tablet 2   Multiple Vitamin (MULTIVITAMIN WITH MINERALS) TABS tablet Take 1 tablet by mouth daily.     tadalafil (CIALIS) 5 MG tablet Take 1-2 tablets (5-10 mg total) by mouth daily as needed for erectile dysfunction. 30 tablet 11   triamterene-hydrochlorothiazide (DYAZIDE) 37.5-25 MG capsule TAKE 1 CAPSULE BY MOUTH EVERY DAY 30 capsule 2   aspirin EC 81 MG tablet Take 1 tablet (81 mg total) by mouth daily. (Patient not taking: Reported on 12/09/2020) 30 tablet 3   polyethylene  glycol-electrolytes (GAVILYTE-N WITH FLAVOR PACK) 420 g solution Drink one 8 oz glass every 20 mins until entire container is finished staring at 5:00pm on 10/10/20 4000 mL 0   cyclobenzaprine (FLEXERIL) 5 MG tablet Take 1 tablet (5 mg total) by mouth 3 (three) times daily as needed for muscle spasms. 30 tablet 0   ibuprofen (ADVIL) 600 MG tablet Take 1 tablet by mouth three times daily with meals 15 tablet 0   lidocaine (LIDODERM) 5 % Place 1 patch onto the skin every 12 (twelve) hours. Remove & Discard patch within 12 hours or as directed by MD (Patient not taking: Reported on 12/09/2020) 10 patch 0   metFORMIN (GLUCOPHAGE) 500 MG tablet TAKE 1 TABLET (500 MG TOTAL) BY MOUTH DAILY. (Patient not taking: Reported on 12/09/2020) 30 tablet 1   No facility-administered medications prior to visit.    No Known Allergies  ROS Review of Systems  Constitutional: Negative.   Respiratory: Negative.    Cardiovascular: Negative.   Musculoskeletal:  Positive for back pain (chronic lower back pain).  Neurological: Negative.      Objective:    Physical Exam HENT:     Head: Normocephalic and atraumatic.     Mouth/Throat:     Mouth: Mucous membranes are moist.  Eyes:     Extraocular Movements: Extraocular movements intact.     Conjunctiva/sclera: Conjunctivae normal.     Pupils: Pupils are equal, round, and reactive to light.  Cardiovascular:     Rate and Rhythm: Normal rate and regular rhythm.     Pulses: Normal pulses.     Heart sounds: Normal heart sounds.  Pulmonary:     Effort: Pulmonary effort is normal.     Breath sounds: Normal breath sounds.  Musculoskeletal:     Lumbar back: Tenderness (with palpation to lower back) present. No swelling or edema. Normal range of motion.  Neurological:     General: No focal deficit present.     Mental Status: He is alert and oriented to person, place, and time. Mental status is at baseline.  Psychiatric:        Mood and Affect: Mood normal.         Behavior: Behavior normal.        Thought Content: Thought content normal.        Judgment: Judgment normal.    BP 123/90 (BP Location: Right Arm, Patient Position: Sitting, Cuff Size: Large)   Pulse 86   Temp (!) 97.2 F (36.2 C)   Resp 16   Ht 5' 9"  (1.753 m)   Wt 214 lb 1.6 oz (97.1 kg)   SpO2 94%   BMI 31.62 kg/m  Wt  Readings from Last 3 Encounters:  12/09/20 214 lb 1.6 oz (97.1 kg)  12/01/20 216 lb 6.4 oz (98.2 kg)  10/28/20 215 lb (97.5 kg)   Encouraged weight loss  Health Maintenance Due  Topic Date Due   Pneumococcal Vaccine 59-49 Years old (1 - PCV) Never done   Hepatitis C Screening  Never done   TETANUS/TDAP  Never done   COLONOSCOPY (Pts 45-15yr Insurance coverage will need to be confirmed)  Never done   Zoster Vaccines- Shingrix (1 of 2) Never done   COVID-19 Vaccine (3 - Booster for Pfizer series) 01/23/2020   INFLUENZA VACCINE  11/29/2020    There are no preventive care reminders to display for this patient.  Lab Results  Component Value Date   TSH 1.110 06/13/2016   Lab Results  Component Value Date   WBC WILL FOLLOW 12/01/2020   HGB WILL FOLLOW 12/01/2020   HCT WILL FOLLOW 12/01/2020   MCV WILL FOLLOW 12/01/2020   PLT WILL FOLLOW 12/01/2020   Lab Results  Component Value Date   NA 140 05/27/2020   K 4.0 05/27/2020   CO2 25 05/27/2020   GLUCOSE 95 05/27/2020   BUN 16 05/27/2020   CREATININE 1.04 05/27/2020   BILITOT 0.9 05/27/2020   ALKPHOS 68 05/27/2020   AST 18 05/27/2020   ALT 37 05/27/2020   PROT 7.2 05/27/2020   ALBUMIN 4.7 05/27/2020   CALCIUM 9.9 05/27/2020   ANIONGAP 10 01/29/2020   Lab Results  Component Value Date   CHOL 239 (H) 10/08/2019   Lab Results  Component Value Date   HDL 39 (L) 10/08/2019   Lab Results  Component Value Date   LDLCALC 135 (H) 10/08/2019   Lab Results  Component Value Date   TRIG 360 (H) 10/08/2019   Lab Results  Component Value Date   CHOLHDL 6.1 (H) 10/08/2019   Lab Results   Component Value Date   HGBA1C 5.4 05/27/2020      Assessment & Plan:    1. Health care maintenance -Routine labs will be checked. - CBC w/Diff; Future - Lipid panel; Future - Comp Met (CMET); Future - Lipid panel - CBC w/Diff - Comp Met (CMET)  2. Essential hypertension -His blood pressure is improving, he will continue on current medication regimen, DASH diet and exercise as tolerated.  3. Acute bilateral low back pain, unspecified whether sciatica present - He will continue on Ibuprofen, educated on medication side effects and advised to notify clinic. [facility] He was advised to notify clinic or go to the ED for worsening symptoms. - ibuprofen (ADVIL) 800 MG tablet; Take 1 tablet (800 mg total) by mouth 2 (two) times daily.  Dispense: 30 tablet; Refill: 0   4. Vitamin D deficiency - His Vitamin D was 26.4 ng/ml, he was advised to take 2000 IU Vitamin D3 otc.   Follow-up: Return in about 1 month (around 01/12/2021), or if symptoms worsen or fail to improve.    Ulysess Witz EJerold Coombe NP

## 2020-12-09 NOTE — Patient Instructions (Signed)
https://www.nhlbi.nih.gov/files/docs/public/heart/dash_brief.pdf">  DASH Eating Plan DASH stands for Dietary Approaches to Stop Hypertension. The DASH eating plan is a healthy eating plan that has been shown to: Reduce high blood pressure (hypertension). Reduce your risk for type 2 diabetes, heart disease, and stroke. Help with weight loss. What are tips for following this plan? Reading food labels Check food labels for the amount of salt (sodium) per serving. Choose foods with less than 5 percent of the Daily Value of sodium. Generally, foods with less than 300 milligrams (mg) of sodium per serving fit into this eating plan. To find whole grains, look for the word "whole" as the first word in the ingredient list. Shopping Buy products labeled as "low-sodium" or "no salt added." Buy fresh foods. Avoid canned foods and pre-made or frozen meals. Cooking Avoid adding salt when cooking. Use salt-free seasonings or herbs instead of table salt or sea salt. Check with your health care provider or pharmacist before using salt substitutes. Do not fry foods. Cook foods using healthy methods such as baking, boiling, grilling, roasting, and broiling instead. Cook with heart-healthy oils, such as olive, canola, avocado, soybean, or sunflower oil. Meal planning  Eat a balanced diet that includes: 4 or more servings of fruits and 4 or more servings of vegetables each day. Try to fill one-half of your plate with fruits and vegetables. 6-8 servings of whole grains each day. Less than 6 oz (170 g) of lean meat, poultry, or fish each day. A 3-oz (85-g) serving of meat is about the same size as a deck of cards. One egg equals 1 oz (28 g). 2-3 servings of low-fat dairy each day. One serving is 1 cup (237 mL). 1 serving of nuts, seeds, or beans 5 times each week. 2-3 servings of heart-healthy fats. Healthy fats called omega-3 fatty acids are found in foods such as walnuts, flaxseeds, fortified milks, and eggs.  These fats are also found in cold-water fish, such as sardines, salmon, and mackerel. Limit how much you eat of: Canned or prepackaged foods. Food that is high in trans fat, such as some fried foods. Food that is high in saturated fat, such as fatty meat. Desserts and other sweets, sugary drinks, and other foods with added sugar. Full-fat dairy products. Do not salt foods before eating. Do not eat more than 4 egg yolks a week. Try to eat at least 2 vegetarian meals a week. Eat more home-cooked food and less restaurant, buffet, and fast food.  Lifestyle When eating at a restaurant, ask that your food be prepared with less salt or no salt, if possible. If you drink alcohol: Limit how much you use to: 0-1 drink a day for women who are not pregnant. 0-2 drinks a day for men. Be aware of how much alcohol is in your drink. In the [place] one drink equals one 12 oz bottle of beer (355 mL), one 5 oz glass of wine (148 mL), or one 1 oz glass of hard liquor (44 mL). General information Avoid eating more than 2,300 mg of salt a day. If you have hypertension, you may need to reduce your sodium intake to 1,500 mg a day. Work with your health care provider to maintain a healthy body weight or to lose weight. Ask what an ideal weight is for you. Get at least 30 minutes of exercise that causes your heart to beat faster (aerobic exercise) most days of the week. Activities may include walking, swimming, or biking. Work with your health care provider   or dietitian to adjust your eating plan to your individual calorie needs. What foods should I eat? Fruits All fresh, dried, or frozen fruit. Canned fruit in natural juice (without addedsugar). Vegetables Fresh or frozen vegetables (raw, steamed, roasted, or grilled). Low-sodium or reduced-sodium tomato and vegetable juice. Low-sodium or reduced-sodium tomatosauce and tomato paste. Low-sodium or reduced-sodium canned vegetables. Grains Whole-grain or  whole-wheat bread. Whole-grain or whole-wheat pasta. Brown rice. Oatmeal. Quinoa. Bulgur. Whole-grain and low-sodium cereals. Pita bread.Low-fat, low-sodium crackers. Whole-wheat flour tortillas. Meats and other proteins Skinless chicken or turkey. Ground chicken or turkey. Pork with fat trimmed off. Fish and seafood. Egg whites. Dried beans, peas, or lentils. Unsalted nuts, nut butters, and seeds. Unsalted canned beans. Lean cuts of beef with fat trimmed off. Low-sodium, lean precooked or cured meat, such as sausages or meatloaves. Dairy Low-fat (1%) or fat-free (skim) milk. Reduced-fat, low-fat, or fat-free cheeses. Nonfat, low-sodium ricotta or cottage cheese. Low-fat or nonfatyogurt. Low-fat, low-sodium cheese. Fats and oils Soft margarine without trans fats. Vegetable oil. Reduced-fat, low-fat, or light mayonnaise and salad dressings (reduced-sodium). Canola, safflower, olive, avocado, soybean, andsunflower oils. Avocado. Seasonings and condiments Herbs. Spices. Seasoning mixes without salt. Other foods Unsalted popcorn and pretzels. Fat-free sweets. The items listed above may not be a complete list of foods and beverages you can eat. Contact a dietitian for more information. What foods should I avoid? Fruits Canned fruit in a light or heavy syrup. Fried fruit. Fruit in cream or buttersauce. Vegetables Creamed or fried vegetables. Vegetables in a cheese sauce. Regular canned vegetables (not low-sodium or reduced-sodium). Regular canned tomato sauce and paste (not low-sodium or reduced-sodium). Regular tomato and vegetable juice(not low-sodium or reduced-sodium). Pickles. Olives. Grains Baked goods made with fat, such as croissants, muffins, or some breads. Drypasta or rice meal packs. Meats and other proteins Fatty cuts of meat. Ribs. Fried meat. Bacon. Bologna, salami, and other precooked or cured meats, such as sausages or meat loaves. Fat from the back of a pig (fatback). Bratwurst.  Salted nuts and seeds. Canned beans with added salt. Canned orsmoked fish. Whole eggs or egg yolks. Chicken or turkey with skin. Dairy Whole or 2% milk, cream, and half-and-half. Whole or full-fat cream cheese. Whole-fat or sweetened yogurt. Full-fat cheese. Nondairy creamers. Whippedtoppings. Processed cheese and cheese spreads. Fats and oils Butter. Stick margarine. Lard. Shortening. Ghee. Bacon fat. Tropical oils, suchas coconut, palm kernel, or palm oil. Seasonings and condiments Onion salt, garlic salt, seasoned salt, table salt, and sea salt. Worcestershire sauce. Tartar sauce. Barbecue sauce. Teriyaki sauce. Soy sauce, including reduced-sodium. Steak sauce. Canned and packaged gravies. Fish sauce. Oyster sauce. Cocktail sauce. Store-bought horseradish. Ketchup. Mustard. Meat flavorings and tenderizers. Bouillon cubes. Hot sauces. Pre-made or packaged marinades. Pre-made or packaged taco seasonings. Relishes. Regular saladdressings. Other foods Salted popcorn and pretzels. The items listed above may not be a complete list of foods and beverages you should avoid. Contact a dietitian for more information. Where to find more information National Heart, Lung, and Blood Institute: www.nhlbi.nih.gov American Heart Association: www.heart.org Academy of Nutrition and Dietetics: www.eatright.org National Kidney Foundation: www.kidney.org Summary The DASH eating plan is a healthy eating plan that has been shown to reduce high blood pressure (hypertension). It may also reduce your risk for type 2 diabetes, heart disease, and stroke. When on the DASH eating plan, aim to eat more fresh fruits and vegetables, whole grains, lean proteins, low-fat dairy, and heart-healthy fats. With the DASH eating plan, you should limit salt (sodium) intake to 2,300   mg a day. If you have hypertension, you may need to reduce your sodium intake to 1,500 mg a day. Work with your health care provider or dietitian to adjust  your eating plan to your individual calorie needs. This information is not intended to replace advice given to you by your health care provider. Make sure you discuss any questions you have with your healthcare provider. Document Revised: 03/21/2019 Document Reviewed: 03/21/2019 Elsevier Patient Education  2022 Elsevier Inc.  

## 2020-12-10 LAB — LIPID PANEL
Chol/HDL Ratio: 3.8 ratio (ref 0.0–5.0)
Cholesterol, Total: 169 mg/dL (ref 100–199)
HDL: 44 mg/dL (ref >39)
LDL Chol Calc (NIH): 94 mg/dL (ref 0–99)
Triglycerides: 180 mg/dL — ABNORMAL HIGH (ref 0–149)
VLDL Cholesterol Cal: 31 mg/dL (ref 5–40)

## 2020-12-10 LAB — COMPREHENSIVE METABOLIC PANEL
ALT: 25 IU/L (ref 0–44)
AST: 30 IU/L (ref 0–40)
Albumin/Globulin Ratio: 2.3 — ABNORMAL HIGH (ref 1.2–2.2)
Albumin: 5.1 g/dL — ABNORMAL HIGH (ref 3.8–4.9)
Alkaline Phosphatase: 66 IU/L (ref 44–121)
BUN/Creatinine Ratio: 14 (ref 9–20)
BUN: 15 mg/dL (ref 6–24)
Bilirubin Total: 0.9 mg/dL (ref 0.0–1.2)
CO2: 25 mmol/L (ref 20–29)
Calcium: 9.7 mg/dL (ref 8.7–10.2)
Chloride: 105 mmol/L (ref 96–106)
Creatinine, Ser: 1.11 mg/dL (ref 0.76–1.27)
Globulin, Total: 2.2 g/dL (ref 1.5–4.5)
Glucose: 73 mg/dL (ref 65–99)
Potassium: 4 mmol/L (ref 3.5–5.2)
Sodium: 147 mmol/L — ABNORMAL HIGH (ref 134–144)
Total Protein: 7.3 g/dL (ref 6.0–8.5)
eGFR: 80 mL/min/{1.73_m2} (ref >59)

## 2020-12-10 LAB — CBC WITH DIFFERENTIAL/PLATELET
Basophils Absolute: 0.1 10*3/uL (ref 0.0–0.2)
Basos: 2 %
EOS (ABSOLUTE): 0.2 10*3/uL (ref 0.0–0.4)
Eos: 3 %
Hematocrit: 47.6 % (ref 37.5–51.0)
Hemoglobin: 16.5 g/dL (ref 13.0–17.7)
Immature Grans (Abs): 0.1 10*3/uL (ref 0.0–0.1)
Immature Granulocytes: 1 %
Lymphocytes Absolute: 2.3 10*3/uL (ref 0.7–3.1)
Lymphs: 37 %
MCH: 30.3 pg (ref 26.6–33.0)
MCHC: 34.7 g/dL (ref 31.5–35.7)
MCV: 87 fL (ref 79–97)
Monocytes Absolute: 0.6 10*3/uL (ref 0.1–0.9)
Monocytes: 10 %
Neutrophils Absolute: 2.9 10*3/uL (ref 1.4–7.0)
Neutrophils: 47 %
Platelets: 277 10*3/uL (ref 150–450)
RBC: 5.45 x10E6/uL (ref 4.14–5.80)
RDW: 13.4 % (ref 11.6–15.4)
WBC: 6.1 10*3/uL (ref 3.4–10.8)

## 2021-01-11 ENCOUNTER — Other Ambulatory Visit: Payer: Self-pay | Admitting: Emergency Medicine

## 2021-01-11 DIAGNOSIS — I10 Essential (primary) hypertension: Secondary | ICD-10-CM

## 2021-01-11 DIAGNOSIS — E781 Pure hyperglyceridemia: Secondary | ICD-10-CM

## 2021-01-11 MED ORDER — TRIAMTERENE-HCTZ 37.5-25 MG PO CAPS: ORAL_CAPSULE | ORAL | 0 refills | Status: DC

## 2021-01-11 MED ORDER — ATORVASTATIN CALCIUM 10 MG PO TABS: 10.0000 mg | ORAL_TABLET | Freq: Every day | ORAL | 0 refills | Status: DC

## 2021-01-11 MED ORDER — AMLODIPINE BESYLATE 10 MG PO TABS: 10.0000 mg | ORAL_TABLET | Freq: Every day | ORAL | 0 refills | Status: DC

## 2021-01-13 ENCOUNTER — Ambulatory Visit: Payer: Self-pay | Admitting: Gerontology

## 2021-01-13 ENCOUNTER — Ambulatory Visit: Payer: Self-pay

## 2021-02-14 ENCOUNTER — Other Ambulatory Visit: Payer: Self-pay | Admitting: Gerontology

## 2021-02-14 DIAGNOSIS — E781 Pure hyperglyceridemia: Secondary | ICD-10-CM

## 2021-02-14 DIAGNOSIS — I10 Essential (primary) hypertension: Secondary | ICD-10-CM

## 2021-02-15 ENCOUNTER — Telehealth: Payer: Self-pay | Admitting: Emergency Medicine

## 2021-02-15 NOTE — Telephone Encounter (Signed)
Called patient to schedule OV prior to refills on medications. Scheduled patient OV for 02/17/21 @ 6:15pm. Sent in 30 days supply of medications and instructed patient that he needs to keep OV prior to additional refills per Benjamine Mola, NP. Patient agreed and voiced understanding.

## 2021-02-17 ENCOUNTER — Ambulatory Visit: Payer: Self-pay | Admitting: Obstetrics and Gynecology

## 2021-02-17 ENCOUNTER — Other Ambulatory Visit: Payer: Self-pay

## 2021-02-17 VITALS — BP 156/97 | HR 82 | Temp 98.7°F | Ht 69.0 in | Wt 209.8 lb

## 2021-02-17 DIAGNOSIS — R351 Nocturia: Secondary | ICD-10-CM

## 2021-02-17 DIAGNOSIS — R058 Other specified cough: Secondary | ICD-10-CM

## 2021-02-17 DIAGNOSIS — J069 Acute upper respiratory infection, unspecified: Secondary | ICD-10-CM

## 2021-02-17 DIAGNOSIS — M545 Low back pain, unspecified: Secondary | ICD-10-CM

## 2021-02-17 DIAGNOSIS — E781 Pure hyperglyceridemia: Secondary | ICD-10-CM

## 2021-02-17 DIAGNOSIS — I10 Essential (primary) hypertension: Secondary | ICD-10-CM

## 2021-02-17 DIAGNOSIS — Z1211 Encounter for screening for malignant neoplasm of colon: Secondary | ICD-10-CM

## 2021-02-17 MED ORDER — IBUPROFEN 800 MG PO TABS: 800.0000 mg | ORAL_TABLET | Freq: Two times a day (BID) | ORAL | 0 refills | Status: DC

## 2021-02-17 MED ORDER — ATORVASTATIN CALCIUM 10 MG PO TABS
10.0000 mg | ORAL_TABLET | Freq: Every day | ORAL | 1 refills | Status: DC
Start: 2021-02-17 — End: 2021-05-25

## 2021-02-17 MED ORDER — ATENOLOL 50 MG PO TABS
50.0000 mg | ORAL_TABLET | Freq: Every day | ORAL | 2 refills | Status: DC
Start: 2021-02-17 — End: 2021-05-25

## 2021-02-17 MED ORDER — TRIAMTERENE-HCTZ 37.5-25 MG PO CAPS: 1.0000 | ORAL_CAPSULE | Freq: Every day | ORAL | 1 refills | Status: DC

## 2021-02-17 MED ORDER — AMLODIPINE BESYLATE 10 MG PO TABS: 10.0000 mg | ORAL_TABLET | Freq: Every day | ORAL | 1 refills | Status: DC

## 2021-02-17 NOTE — Progress Notes (Signed)
Established Patient Office Visit  Subjective:  Patient ID: Christopher Harrell, male    DOB: 06/04/1970  Age: 50 y.o. MRN: [id number]  CC:  Chief Complaint  Patient presents with   Back Pain   Cough   Pre-visit Screening Tool Documentation   URI    Back pain 9 out of 10, congestion, cough, can't sleep. Ran out of IBU 800 mg, wakes up to pee 5-6 times a night    HPI Christopher Harrell  is a 50 y/o male who has history of ED, Hypertension, Hyperlipidemia, Tobacco use, presents for routine follow up and lab review. He states that he's compliant with his medications, denies side effects and continues to make healthy lifestyle changes. He checks his blood pressure at home and it usually runs in the 140/90's. Had normal labs 8/22.   He states that he continues to experience constant lower back pain, worse due to lifting at his job. No relief with flexeril, takes ibup 800 mg with some improvement. Wants Rx RF. Never had lumbar xray 2/22. Was supposed to have ref to [facility], but pt doesn't have appt yet.    He complains of nocturia with good flow, several times nightly. Drinks tea, sprite, and fluids up until bed time. Also on triamterene/HCTZ, takes in AM.   URI sx of 4 days prod cough, nasal congestion, body aches, rhinorrhea, no fever, no sore throat. Hasn't had covid test. Taking Nyquil, benadryl, no decongestants without sx relief.   Would like colonoscopy due to age.    Past Medical History:  Diagnosis Date   Community acquired pneumonia    a. Dx 01/21/2016 - [facility].   Diabetes mellitus without complication (HCC)    ED (erectile dysfunction)    Essential hypertension    Hyperlipidemia    Tobacco abuse     Past Surgical History:  Procedure Laterality Date   NO PAST SURGERIES      Family History  Problem Relation Age of Onset   Hypertension Mother    Lung cancer Mother    Hyperlipidemia Mother    Heart attack Father 31   Heart attack Maternal Grandmother     Social History    Socioeconomic History   Marital status: Legally Separated    Spouse name: Not on file   Number of children: Not on file   Years of education: Not on file   Highest education level: Not on file  Occupational History   Occupation: Picture framer    Comment: [employer]  Tobacco Use   Smoking status: Every Day    Packs/day: 0.50    Years: 20.00    Pack years: 10.00    Types: Cigarettes   Smokeless tobacco: Never   Tobacco comments:    Tried gum.  Vaping Use   Vaping Use: Never used  Substance and Sexual Activity   Alcohol use: Not Currently   Drug use: No   Sexual activity: Yes    Birth control/protection: None  Other Topics Concern   Not on file  Social History Narrative   No longer living with wife. Walking for 20 mins per day   Social Determinants of Health   Financial Resource Strain: Not on file  Food Insecurity: No Food Insecurity   Worried About Charity fundraiser in the Last Year: Never true   Ran Out of Food in the Last Year: Never true  Transportation Needs: No Transportation Needs   Lack of Transportation (Medical): No   Lack of  Transportation (Non-Medical): No  Physical Activity: Not on file  Stress: Not on file  Social Connections: Not on file  Intimate Partner Violence: Not on file    Outpatient Medications Prior to Visit  Medication Sig Dispense Refill   albuterol (VENTOLIN HFA) 108 (90 Base) MCG/ACT inhaler Inhale 2 puffs into the lungs every 6 (six) hours as needed for wheezing or shortness of breath. 1 each 1   Multiple Vitamin (MULTIVITAMIN WITH MINERALS) TABS tablet Take 1 tablet by mouth daily.     tadalafil (CIALIS) 5 MG tablet Take 1-2 tablets (5-10 mg total) by mouth daily as needed for erectile dysfunction. 30 tablet 11   amLODipine (NORVASC) 10 MG tablet TAKE 1 TABLET BY MOUTH EVERY DAY 30 tablet 0   atenolol (TENORMIN) 50 MG tablet Take 1 tablet (50 mg total) by mouth daily. 30 tablet 2   atorvastatin (LIPITOR) 10 MG tablet TAKE 1  TABLET BY MOUTH EVERY DAY 30 tablet 0   ibuprofen (ADVIL) 800 MG tablet Take 1 tablet (800 mg total) by mouth 2 (two) times daily. 30 tablet 0   triamterene-hydrochlorothiazide (DYAZIDE) 37.5-25 MG capsule TAKE 1 CAPSULE BY MOUTH EVERY DAY 30 capsule 0   aspirin EC 81 MG tablet Take 1 tablet (81 mg total) by mouth daily. (Patient not taking: No sig reported) 30 tablet 3   polyethylene glycol-electrolytes (GAVILYTE-N WITH FLAVOR PACK) 420 g solution Drink one 8 oz glass every 20 mins until entire container is finished staring at 5:00pm on 10/10/20 (Patient not taking: Reported on 02/17/2021) 4000 mL 0   No facility-administered medications prior to visit.    No Known Allergies  ROS Review of Systems  Constitutional: Negative.  Negative for fever.  HENT:  Positive for congestion, postnasal drip and rhinorrhea. Negative for sore throat.   Respiratory:  Positive for cough.   Cardiovascular: Negative.   Genitourinary:  Positive for frequency.  Musculoskeletal:  Positive for back pain (chronic lower back pain).  Neurological: Negative.      Objective:    Physical Exam HENT:     Head: Normocephalic and atraumatic.  Cardiovascular:     Rate and Rhythm: Normal rate and regular rhythm.     Heart sounds: Normal heart sounds.  Pulmonary:     Effort: Pulmonary effort is normal.     Breath sounds: Wheezing present.     Comments: BILAT DIFFUSE WHEEZING Musculoskeletal:     Cervical back: Normal range of motion.     Lumbar back: Tenderness (with palpation to lower back) present. No swelling or edema. Normal range of motion.  Lymphadenopathy:     Cervical: No cervical adenopathy.  Neurological:     General: No focal deficit present.     Mental Status: He is alert and oriented to person, place, and time. Mental status is at baseline.  Psychiatric:        Mood and Affect: Mood normal.        Behavior: Behavior normal.        Thought Content: Thought content normal.        Judgment: Judgment  normal.    BP (!) 156/97   Pulse 82   Temp 98.7 F (37.1 C)   Ht 5' 9"  (1.753 m)   Wt 209 lb 12.8 oz (95.2 kg)   SpO2 95%   BMI 30.98 kg/m  Wt Readings from Last 3 Encounters:  02/17/21 209 lb 12.8 oz (95.2 kg)  12/09/20 214 lb 1.6 oz (97.1 kg)  12/01/20 216 lb  6.4 oz (98.2 kg)   Encouraged weight loss  Health Maintenance Due  Topic Date Due   Pneumococcal Vaccine 57-73 Years old (1 - PCV) Never done   Hepatitis C Screening  Never done   TETANUS/TDAP  Never done   COLONOSCOPY (Pts 45-16yr Insurance coverage will need to be confirmed)  Never done   Zoster Vaccines- Shingrix (1 of 2) Never done   COVID-19 Vaccine (3 - Booster for Pfizer series) 10/18/2019   INFLUENZA VACCINE  11/29/2020    There are no preventive care reminders to display for this patient.  Lab Results  Component Value Date   TSH 1.110 06/13/2016   Lab Results  Component Value Date   WBC 6.1 12/09/2020   HGB 16.5 12/09/2020   HCT 47.6 12/09/2020   MCV 87 12/09/2020   PLT 277 12/09/2020   Lab Results  Component Value Date   NA 147 (H) 12/09/2020   K 4.0 12/09/2020   CO2 25 12/09/2020   GLUCOSE 73 12/09/2020   BUN 15 12/09/2020   CREATININE 1.11 12/09/2020   BILITOT 0.9 12/09/2020   ALKPHOS 66 12/09/2020   AST 30 12/09/2020   ALT 25 12/09/2020   PROT 7.3 12/09/2020   ALBUMIN 5.1 (H) 12/09/2020   CALCIUM 9.7 12/09/2020   ANIONGAP 10 01/29/2020   EGFR 80 12/09/2020   Lab Results  Component Value Date   CHOL 169 12/09/2020   Lab Results  Component Value Date   HDL 44 12/09/2020   Lab Results  Component Value Date   LDLCALC 94 12/09/2020   Lab Results  Component Value Date   TRIG 180 (H) 12/09/2020   Lab Results  Component Value Date   CHOLHDL 3.8 12/09/2020   Lab Results  Component Value Date   HGBA1C 5.4 05/27/2020      Assessment & Plan:   Essential hypertension - Plan: amLODipine (NORVASC) 10 MG tablet, atenolol (TENORMIN) 50 MG tablet,  triamterene-hydrochlorothiazide (DYAZIDE) 37.5-25 MG capsule; BP elevated tonight but taking nyquil/URI meds. Rx RF. F/u in 3 months.  Hypertriglyceridemia - Plan: atorvastatin (LIPITOR) 10 MG tablet; improved with lipitor. D/C starchy and sugary foods  Chronic low back pain without sciatica, unspecified back pain laterality - Plan: ibuprofen (ADVIL) 800 MG tablet, DG Lumbar Spine Complete; Rx RF ibup, [facility] xray[facility]  Nocturia--d/c caffeine after 12 noon and stop fluids 2-3 hrs before bed. Taking HCTZ in AM  Viral upper respiratory tract infection--pt needs to do covid testing.  Productive cough--add coricidin HBP. Has diffuse wheezing, start albuterol inhaler (pt has one at home)  Screening for colon cancer - Plan: Ambulatory referral to Gastroenterology   Follow-up: Return in about 3 months (around 05/20/2021) for BP f/u[facility]    Gael Londo, PA-C

## 2021-02-28 ENCOUNTER — Other Ambulatory Visit: Payer: Self-pay

## 2021-02-28 DIAGNOSIS — Z1211 Encounter for screening for malignant neoplasm of colon: Secondary | ICD-10-CM

## 2021-02-28 MED ORDER — PEG 3350-KCL-NA BICARB-NACL 420 G PO SOLR: 4000.0000 mL | Freq: Once | ORAL | 0 refills | Status: AC

## 2021-02-28 NOTE — Progress Notes (Signed)
Gastroenterology Pre-Procedure Review  Request Date: 03/08/21 Requesting Physician: Dr. Allen Norris  PATIENT REVIEW QUESTIONS: The patient responded to the following health history questions as indicated:    1. Are you having any GI issues? no 2. Do you have a personal history of Polyps? no 3. Do you have a family history of Colon Cancer or Polyps? no 4. Diabetes Mellitus? no 5. Joint replacements in the past 12 months?no 6. Major health problems in the past 3 months?no 7. Any artificial heart valves, MVP, or defibrillator?no    MEDICATIONS & ALLERGIES:    Patient reports the following regarding taking any anticoagulation/antiplatelet therapy:   Plavix, Coumadin, Eliquis, Xarelto, Lovenox, Pradaxa, Brilinta, or Effient? no Aspirin? no  Patient confirms/reports the following medications:  Current Outpatient Medications  Medication Sig Dispense Refill   albuterol (VENTOLIN HFA) 108 (90 Base) MCG/ACT inhaler Inhale 2 puffs into the lungs every 6 (six) hours as needed for wheezing or shortness of breath. 1 each 1   amLODipine (NORVASC) 10 MG tablet Take 1 tablet (10 mg total) by mouth daily. 30 tablet 1   aspirin EC 81 MG tablet Take 1 tablet (81 mg total) by mouth daily. (Patient not taking: No sig reported) 30 tablet 3   atenolol (TENORMIN) 50 MG tablet Take 1 tablet (50 mg total) by mouth daily. 30 tablet 2   atorvastatin (LIPITOR) 10 MG tablet Take 1 tablet (10 mg total) by mouth daily. 30 tablet 1   ibuprofen (ADVIL) 800 MG tablet Take 1 tablet (800 mg total) by mouth 2 (two) times daily. 30 tablet 0   Multiple Vitamin (MULTIVITAMIN WITH MINERALS) TABS tablet Take 1 tablet by mouth daily.     polyethylene glycol-electrolytes (GAVILYTE-N WITH FLAVOR PACK) 420 g solution Drink one 8 oz glass every 20 mins until entire container is finished staring at 5:00pm on 10/10/20 (Patient not taking: Reported on 02/17/2021) 4000 mL 0   tadalafil (CIALIS) 5 MG tablet Take 1-2 tablets (5-10 mg total) by  mouth daily as needed for erectile dysfunction. 30 tablet 11   triamterene-hydrochlorothiazide (DYAZIDE) 37.5-25 MG capsule Take 1 each (1 capsule total) by mouth daily. 30 capsule 1   No current facility-administered medications for this visit.    Patient confirms/reports the following allergies:  No Known Allergies  No orders of the defined types were placed in this encounter.   AUTHORIZATION INFORMATION Primary Insurance: 1D#: Group #:  Secondary Insurance: 1D#: Group #:  SCHEDULE INFORMATION: Date: 03/08/21 Time: Location: [place]

## 2021-03-07 ENCOUNTER — Telehealth: Payer: Self-pay | Admitting: Gastroenterology

## 2021-03-07 ENCOUNTER — Encounter: Payer: Self-pay | Admitting: Gastroenterology

## 2021-03-07 NOTE — Telephone Encounter (Signed)
Patient wants to reschedule procedure. He didn't know he was scheduled and didn't receive his letters until today

## 2021-03-08 ENCOUNTER — Encounter: Payer: Self-pay | Admitting: Anesthesiology

## 2021-03-08 ENCOUNTER — Ambulatory Visit: Admission: RE | Admit: 2021-03-08 | Payer: Self-pay | Source: Ambulatory Visit | Admitting: Gastroenterology

## 2021-03-08 ENCOUNTER — Encounter: Admission: RE | Payer: Self-pay | Source: Ambulatory Visit

## 2021-03-08 SURGERY — COLONOSCOPY WITH PROPOFOL
Anesthesia: General

## 2021-03-08 NOTE — Telephone Encounter (Signed)
Returned patients call. Informed patient the procedure was scheduled back on 10/31. Paperwork was mailed out to patient. LVM for patient to call and reschedule.

## 2021-03-14 ENCOUNTER — Emergency Department
Admission: EM | Admit: 2021-03-14 | Discharge: 2021-03-14 | Disposition: A | Discharge status: Home / Self Care | Payer: Self-pay | Attending: Emergency Medicine | Admitting: Emergency Medicine

## 2021-03-14 ENCOUNTER — Other Ambulatory Visit: Payer: Self-pay

## 2021-03-14 ENCOUNTER — Emergency Department: Payer: Self-pay

## 2021-03-14 DIAGNOSIS — J449 Chronic obstructive pulmonary disease, unspecified: Secondary | ICD-10-CM | POA: Insufficient documentation

## 2021-03-14 DIAGNOSIS — S39012A Strain of muscle, fascia and tendon of lower back, initial encounter: Secondary | ICD-10-CM | POA: Insufficient documentation

## 2021-03-14 DIAGNOSIS — Y990 Civilian activity done for income or pay: Secondary | ICD-10-CM | POA: Insufficient documentation

## 2021-03-14 DIAGNOSIS — Z79899 Other long term (current) drug therapy: Secondary | ICD-10-CM | POA: Insufficient documentation

## 2021-03-14 DIAGNOSIS — F17210 Nicotine dependence, cigarettes, uncomplicated: Secondary | ICD-10-CM | POA: Insufficient documentation

## 2021-03-14 DIAGNOSIS — X500XXA Overexertion from strenuous movement or load, initial encounter: Secondary | ICD-10-CM | POA: Insufficient documentation

## 2021-03-14 DIAGNOSIS — M47816 Spondylosis without myelopathy or radiculopathy, lumbar region: Secondary | ICD-10-CM | POA: Insufficient documentation

## 2021-03-14 DIAGNOSIS — E119 Type 2 diabetes mellitus without complications: Secondary | ICD-10-CM | POA: Insufficient documentation

## 2021-03-14 DIAGNOSIS — I10 Essential (primary) hypertension: Secondary | ICD-10-CM | POA: Insufficient documentation

## 2021-03-14 DIAGNOSIS — Z7982 Long term (current) use of aspirin: Secondary | ICD-10-CM | POA: Insufficient documentation

## 2021-03-14 MED ORDER — BACLOFEN 10 MG PO TABS: 10.0000 mg | ORAL_TABLET | Freq: Three times a day (TID) | ORAL | 0 refills | Status: AC

## 2021-03-14 MED ORDER — PREDNISONE 10 MG (21) PO TBPK: ORAL_TABLET | ORAL | 0 refills | Status: DC

## 2021-03-14 NOTE — Discharge Instructions (Signed)
Follow-up with orthopedics.  You need to have an evaluation of your back.  They may be able to inject steroids into your facet joints to prevent pain.  They can also assess your shoulder pain.  Take the medication as prescribed.  Apply ice to all areas that hurt.

## 2021-03-14 NOTE — ED Notes (Signed)
See triage note  presents with lower back pain  states pain started about a week ago  states he noticed pain after lifting something heavy  ambulates well to treatment room

## 2021-03-14 NOTE — ED Triage Notes (Addendum)
Pt states that he has had back pain in the past but states that a couple weeks ago while working lifting heavy material he started having back pain again, pt states that he feels like there is a sharp knife sticking him in his lower back  Pt states that he takes 3 bp meds but states that he hasn't had them today

## 2021-03-14 NOTE — ED Provider Notes (Signed)
[facility] Emergency Department Provider Note  ____________________________________________   Event Date/Time   First MD Initiated Contact with Patient 03/14/21 [phone]     (approximate)  I have reviewed the triage vital signs and the nursing notes.   HISTORY  Chief Complaint Back Pain    HPI Christopher Harrell is a 50 y.o. male  C/o low back pain for 20 day, known injury, patient was lifting heavy items at work, had sharp pain in the lower back, this was 3 weeks ago, pain is worse with movement, increased with bending over, denies numbness, tingling, or changes in bowel/urinary habits,  Using otc meds without relief Remainder ros neg   Past Medical History:  Diagnosis Date   Community acquired pneumonia    a. Dx 01/21/2016 - LLL PNA.   Diabetes mellitus without complication [facility])    ED (erectile dysfunction)    Essential hypertension    Hyperlipidemia    Tobacco abuse     Patient Active Problem List   Diagnosis Date Noted   Vitamin D deficiency 12/09/2020   Chronic midline low back pain with bilateral sciatica 06/10/2020   Back pain 05/13/2020   Essential hypertension 08/13/2019   Erectile dysfunction 06/25/2019   Acute flank pain 06/25/2019   Tooth abscess 04/30/2019   Smoking 04/30/2019   Hypertriglyceridemia 06/05/2017   COPD with acute exacerbation ([place]) 10/14/2016   Pneumonia 10/14/2016   Hypertensive urgency, malignant 06/13/2016   Tobacco abuse 06/13/2016   Community acquired pneumonia 01/23/2016   Elevated troponin 01/23/2016   HTN (hypertension) 01/23/2016   Chest pain 01/23/2016    Past Surgical History:  Procedure Laterality Date   NO PAST SURGERIES      Prior to Admission medications   Medication Sig Start Date End Date Taking? Authorizing Provider  baclofen (LIORESAL) 10 MG tablet Take 1 tablet (10 mg total) by mouth 3 (three) times daily for 7 days. 03/14/21 03/21/21 Yes Doriana Mazurkiewicz, Linden Dolin, PA-C  predniSONE (STERAPRED  UNI-PAK 21 TAB) 10 MG (21) TBPK tablet Take 6 pills on day one then decrease by 1 pill each day 03/14/21  Yes Chantell Kunkler, Linden Dolin, PA-C  albuterol (VENTOLIN HFA) 108 (90 Base) MCG/ACT inhaler Inhale 2 puffs into the lungs every 6 (six) hours as needed for wheezing or shortness of breath. 5/99/35   Copland, Elmo Putt B, PA-C  amLODipine (NORVASC) 10 MG tablet Take 1 tablet (10 mg total) by mouth daily. 70/17/79   Copland, Deirdre Evener, PA-C  aspirin EC 81 MG tablet Take 1 tablet (81 mg total) by mouth daily. Patient not taking: No sig reported 07/16/19   Iloabachie, Chioma E, NP  atenolol (TENORMIN) 50 MG tablet Take 1 tablet (50 mg total) by mouth daily. 02/17/21 39/03/00  Copland, Deirdre Evener, PA-C  atorvastatin (LIPITOR) 10 MG tablet Take 1 tablet (10 mg total) by mouth daily. 92/33/00   Copland, Deirdre Evener, PA-C  ibuprofen (ADVIL) 800 MG tablet Take 1 tablet (800 mg total) by mouth 2 (two) times daily. 76/22/63   Copland, Deirdre Evener, PA-C  Multiple Vitamin (MULTIVITAMIN WITH MINERALS) TABS tablet Take 1 tablet by mouth daily.    [provider]  tadalafil (CIALIS) 5 MG tablet Take 1-2 tablets (5-10 mg total) by mouth daily as needed for erectile dysfunction. 10/07/20   Billey Co, MD  triamterene-hydrochlorothiazide (DYAZIDE) 37.5-25 MG capsule Take 1 each (1 capsule total) by mouth daily. 33/54/56   Copland, Deirdre Evener, PA-C    Allergies Patient has no known allergies.  Family History  Problem Relation Age of Onset   Hypertension Mother    Lung cancer Mother    Hyperlipidemia Mother    Heart attack Father 22   Heart attack Maternal Grandmother     Social History Social History   Tobacco Use   Smoking status: Every Day    Packs/day: 0.50    Years: 20.00    Pack years: 10.00    Types: Cigarettes   Smokeless tobacco: Never   Tobacco comments:    Tried gum.  Vaping Use   Vaping Use: Never used  Substance Use Topics   Alcohol use: Not Currently   Drug use: No    Review of  Systems  Constitutional: No fever/chills Eyes: No visual changes. ENT: No sore throat. Respiratory: Denies cough Genitourinary: Negative for dysuria. Musculoskeletal: Positive for back pain. Skin: Negative for rash.    ____________________________________________   PHYSICAL EXAM:  VITAL SIGNS: ED Triage Vitals  Enc Vitals Group     BP 03/14/21 0914 (!) 163/107     Pulse Rate 03/14/21 0914 67     Resp 03/14/21 0914 16     Temp 03/14/21 0914 97.7 F (36.5 C)     Temp Source 03/14/21 0914 Oral     SpO2 03/14/21 0914 99 %     Weight 03/14/21 0915 215 lb (97.5 kg)     Height 03/14/21 0915 5\' 9"  (1.753 m)     Head Circumference --      Peak Flow --      Pain Score 03/14/21 0915 9     Pain Loc --      Pain Edu? --      Excl. in [place]? --     Constitutional: Alert and oriented. Well appearing and in no acute distress. Eyes: Conjunctivae are normal.  Head: Atraumatic. Nose: No congestion/rhinnorhea. Mouth/Throat: Mucous membranes are moist.   Neck:  supple no lymphadenopathy noted Cardiovascular: Normal rate, regular rhythm. Heart sounds are normal Respiratory: Normal respiratory effort.  No retractions, lungs c t a  Abd: soft nontender bs normal all 4 quad GU: deferred Musculoskeletal: FROM all extremities, warm and well perfused.  Decreased rom of back due to discomfort, lumbar spine tender, negative slr, 5/5 strength in great toes b/l, 5/5 strength in lower legs, n/v intact, full range of motion of the shoulders, rotator muscles tender bilaterally Neurologic:  Normal speech and language.  Skin:  Skin is warm, dry and intact. No rash noted. Psychiatric: Mood and affect are normal. Speech and behavior are normal.  ____________________________________________   LABS (all labs ordered are listed, but only abnormal results are displayed)  Labs Reviewed - No data to  display ____________________________________________   ____________________________________________  RADIOLOGY  X-ray lumbar spine  ____________________________________________   PROCEDURES  Procedure(s) performed: No  Procedures    ____________________________________________   INITIAL IMPRESSION / ASSESSMENT AND PLAN / ED COURSE  Pertinent labs & imaging results that were available during my care of the patient were reviewed by me and considered in my medical decision making (see chart for details).   The patient is a 50 year old male presents emergency department complaining of low back pain and shoulder pain.  See HPI.  Physical exam shows patient appears stable.  X-ray of the lumbar spine reviewed by me confirmed by radiology to have facet arthropathy and degenerative changes.    I did discuss this with the patient.  He is to follow-up with orthopedics.  Explained to him that we can try a  steroid and muscle relaxer but I do feel he still needs to follow-up with orthopedics for evaluation of his back.  He may need physical therapy or MRI along with injections into the facet joints.  States he understands and will comply.  He was discharged stable condition.  Given a work note.     As part of my medical decision making, I reviewed the following data within the [place] notes reviewed and incorporated, Old chart reviewed, Radiograph reviewed , Notes from prior ED visits, and [place] Controlled Substance Database  ____________________________________________   FINAL CLINICAL IMPRESSION(S) / ED DIAGNOSES  Final diagnoses:  Acute myofascial strain of lumbar region, initial encounter  Facet arthritis of lumbar region      NEW MEDICATIONS STARTED DURING THIS VISIT:  New Prescriptions   BACLOFEN (LIORESAL) 10 MG TABLET    Take 1 tablet (10 mg total) by mouth 3 (three) times daily for 7 days.   PREDNISONE (STERAPRED UNI-PAK 21 TAB) 10 MG (21)  TBPK TABLET    Take 6 pills on day one then decrease by 1 pill each day     Note:  This document was prepared using Dragon voice recognition software and may include unintentional dictation errors.     Versie Starks, PA-C 03/14/21 1022    Lucrezia Starch, MD 03/14/21 1536

## 2021-03-15 NOTE — Telephone Encounter (Signed)
Pt called to reschedule his procedure for his colonoscopy

## 2021-03-16 ENCOUNTER — Other Ambulatory Visit: Payer: Self-pay

## 2021-03-16 DIAGNOSIS — Z1211 Encounter for screening for malignant neoplasm of colon: Secondary | ICD-10-CM

## 2021-03-16 MED ORDER — PEG 3350-KCL-NA BICARB-NACL 420 G PO SOLR: 4000.0000 mL | Freq: Once | ORAL | 0 refills | Status: AC

## 2021-03-16 NOTE — Telephone Encounter (Signed)
Procedure scheduled for 04/05/21.

## 2021-03-16 NOTE — Progress Notes (Signed)
Procedure rescheduled for 04/05/21. Updated instructions sent via mail. New prep order sent.

## 2021-04-01 ENCOUNTER — Other Ambulatory Visit: Payer: Self-pay

## 2021-04-01 ENCOUNTER — Encounter: Payer: Self-pay | Admitting: Emergency Medicine

## 2021-04-01 ENCOUNTER — Emergency Department
Admission: EM | Admit: 2021-04-01 | Discharge: 2021-04-01 | Disposition: A | Discharge status: Home / Self Care | Payer: Self-pay | Attending: Emergency Medicine | Admitting: Emergency Medicine

## 2021-04-01 DIAGNOSIS — I10 Essential (primary) hypertension: Secondary | ICD-10-CM | POA: Insufficient documentation

## 2021-04-01 DIAGNOSIS — F17210 Nicotine dependence, cigarettes, uncomplicated: Secondary | ICD-10-CM | POA: Insufficient documentation

## 2021-04-01 DIAGNOSIS — J441 Chronic obstructive pulmonary disease with (acute) exacerbation: Secondary | ICD-10-CM | POA: Insufficient documentation

## 2021-04-01 DIAGNOSIS — Z7982 Long term (current) use of aspirin: Secondary | ICD-10-CM | POA: Insufficient documentation

## 2021-04-01 DIAGNOSIS — Z79899 Other long term (current) drug therapy: Secondary | ICD-10-CM | POA: Insufficient documentation

## 2021-04-01 DIAGNOSIS — M4716 Other spondylosis with myelopathy, lumbar region: Secondary | ICD-10-CM | POA: Insufficient documentation

## 2021-04-01 MED ORDER — OXYCODONE-ACETAMINOPHEN 5-325 MG PO TABS
1.0000 | ORAL_TABLET | Freq: Once | ORAL | Status: AC
  Administered 2021-04-01: 1 via ORAL
  Filled 2021-04-01: qty 1

## 2021-04-01 MED ORDER — KETOROLAC TROMETHAMINE 60 MG/2ML IM SOLN
60.0000 mg | Freq: Once | INTRAMUSCULAR | Status: AC
  Administered 2021-04-01: 60 mg via INTRAMUSCULAR
  Filled 2021-04-01: qty 2

## 2021-04-01 MED ORDER — ORPHENADRINE CITRATE ER 100 MG PO TB12
100.0000 mg | ORAL_TABLET | Freq: Two times a day (BID) | ORAL | 0 refills | Status: DC
Start: 2021-04-01 — End: 2021-04-18

## 2021-04-01 MED ORDER — NAPROXEN 500 MG PO TABS: 500.0000 mg | ORAL_TABLET | Freq: Two times a day (BID) | ORAL | Status: DC

## 2021-04-01 MED ORDER — ORPHENADRINE CITRATE 30 MG/ML IJ SOLN
60.0000 mg | Freq: Two times a day (BID) | INTRAMUSCULAR | Status: DC
  Administered 2021-04-01: 60 mg via INTRAMUSCULAR
  Filled 2021-04-01: qty 2

## 2021-04-01 NOTE — ED Triage Notes (Signed)
Pt to ED from home c/o mid lower back pain "for a while" but couldn't sleep tonight.  States deals with back pain, IBU not working at home tonight.  States most recent job he did a lot of heavy lifting thinking that's where this started from.  Denies urinary changes.  Pt A&Ox4, chest rise even and unlabored, skin WNL and in NAD at this time.

## 2021-04-01 NOTE — ED Provider Notes (Signed)
[facility] Emergency Department Provider Note   ____________________________________________   Event Date/Time   First MD Initiated Contact with Patient 04/01/21 [phone]     (approximate)  I have reviewed the triage vital signs and the nursing notes.   HISTORY  Chief Complaint Back Pain    HPI Christopher Harrell is a 50 y.o. male patient complain of mid low back pain for greater than 1 month.  Patient state job requires repetitive heavy lifting.  Patient stated unable to work secondary to pain.  Patient denies radicular component to pain.  Patient denies bladder or bowel dysfunction.  Rates pain as a 9/10.  Described pain as "achy".  No palliative measure for complaint.  It was noted that patient blood pressure elevated.  Patient has not taken his a.m. hypertension medications.         Past Medical History:  Diagnosis Date   Community acquired pneumonia    a. Dx 01/21/2016 - LLL PNA.   Diabetes mellitus without complication [facility])    ED (erectile dysfunction)    Essential hypertension    Hyperlipidemia    Tobacco abuse     Patient Active Problem List   Diagnosis Date Noted   Vitamin D deficiency 12/09/2020   Chronic midline low back pain with bilateral sciatica 06/10/2020   Back pain 05/13/2020   Essential hypertension 08/13/2019   Erectile dysfunction 06/25/2019   Acute flank pain 06/25/2019   Tooth abscess 04/30/2019   Smoking 04/30/2019   Hypertriglyceridemia 06/05/2017   COPD with acute exacerbation ([place]) 10/14/2016   Pneumonia 10/14/2016   Hypertensive urgency, malignant 06/13/2016   Tobacco abuse 06/13/2016   Community acquired pneumonia 01/23/2016   Elevated troponin 01/23/2016   HTN (hypertension) 01/23/2016   Chest pain 01/23/2016    Past Surgical History:  Procedure Laterality Date   NO PAST SURGERIES      Prior to Admission medications   Medication Sig Start Date End Date Taking? Authorizing Provider  naproxen (NAPROSYN)  500 MG tablet Take 1 tablet (500 mg total) by mouth 2 (two) times daily with a meal. 04/01/21  Yes Sable Feil, PA-C  orphenadrine (NORFLEX) 100 MG tablet Take 1 tablet (100 mg total) by mouth 2 (two) times daily. 04/01/21  Yes Sable Feil, PA-C  albuterol (VENTOLIN HFA) 108 (90 Base) MCG/ACT inhaler Inhale 2 puffs into the lungs every 6 (six) hours as needed for wheezing or shortness of breath. 0/62/37   Copland, Elmo Putt B, PA-C  amLODipine (NORVASC) 10 MG tablet Take 1 tablet (10 mg total) by mouth daily. 62/83/15   Copland, Deirdre Evener, PA-C  aspirin EC 81 MG tablet Take 1 tablet (81 mg total) by mouth daily. Patient not taking: No sig reported 07/16/19   Iloabachie, Chioma E, NP  atenolol (TENORMIN) 50 MG tablet Take 1 tablet (50 mg total) by mouth daily. 02/17/21 17/61/60  Copland, Deirdre Evener, PA-C  atorvastatin (LIPITOR) 10 MG tablet Take 1 tablet (10 mg total) by mouth daily. 73/71/06   Copland, Deirdre Evener, PA-C  Multiple Vitamin (MULTIVITAMIN WITH MINERALS) TABS tablet Take 1 tablet by mouth daily.    [provider]  predniSONE (STERAPRED UNI-PAK 21 TAB) 10 MG (21) TBPK tablet Take 6 pills on day one then decrease by 1 pill each day 03/14/21   Versie Starks, PA-C  tadalafil (CIALIS) 5 MG tablet Take 1-2 tablets (5-10 mg total) by mouth daily as needed for erectile dysfunction. 10/07/20   Billey Co, MD  triamterene-hydrochlorothiazide (  DYAZIDE) 37.5-25 MG capsule Take 1 each (1 capsule total) by mouth daily. 13/24/40   Copland, Deirdre Evener, PA-C    Allergies Patient has no known allergies.  Family History  Problem Relation Age of Onset   Hypertension Mother    Lung cancer Mother    Hyperlipidemia Mother    Heart attack Father 41   Heart attack Maternal Grandmother     Social History Social History   Tobacco Use   Smoking status: Every Day    Packs/day: 0.50    Years: 20.00    Pack years: 10.00    Types: Cigarettes   Smokeless tobacco: Never   Tobacco comments:     Tried gum.  Vaping Use   Vaping Use: Never used  Substance Use Topics   Alcohol use: Not Currently   Drug use: No    Review of Systems Constitutional: No fever/chills Eyes: No visual changes. ENT: No sore throat. Cardiovascular: Denies chest pain. Respiratory: Denies shortness of breath. Gastrointestinal: No abdominal pain.  No nausea, no vomiting.  No diarrhea.  No constipation. Genitourinary: Negative for dysuria. Musculoskeletal: for back pain. Skin: Negative for rash. Neurological: Negative for headaches, focal weakness or numbness. Endocrine: Diabetes, hyperlipidemia, and hypertension.  ____________________________________________   PHYSICAL EXAM:  VITAL SIGNS: ED Triage Vitals  Enc Vitals Group     BP 04/01/21 0425 (!) 170/115     Pulse Rate 04/01/21 0425 82     Resp 04/01/21 0425 16     Temp 04/01/21 0425 97.7 F (36.5 C)     Temp Source 04/01/21 0425 Oral     SpO2 04/01/21 0425 95 %     Weight 04/01/21 0423 213 lb (96.6 kg)     Height 04/01/21 0423 5\' 9"  (1.753 m)     Head Circumference --      Peak Flow --      Pain Score 04/01/21 0423 9     Pain Loc --      Pain Edu? --      Excl. in [place]? --     Constitutional: Alert and oriented.  Moderate distress. Eyes: Conjunctivae are normal. PERRL. EOMI. Head: Atraumatic. Nose: No congestion/rhinnorhea. Mouth/Throat: Mucous membranes are moist.  Oropharynx non-erythematous. Neck: No stridor.  No cervical spine tenderness to palpation. Hematological/Lymphatic/Immunilogical: No cervical lymphadenopathy. Cardiovascular: Normal rate, regular rhythm. Grossly normal heart sounds.  Good peripheral circulation.  Elevated blood pressure. Respiratory: Normal respiratory effort.  No retractions. Lungs CTAB. Gastrointestinal: Soft and nontender. No distention. No abdominal bruits. No CVA tenderness. Genitourinary: Deferred Musculoskeletal: No obvious lumbar spine deformity.  Patient is moderate guarding palpation of L3-S1.   Patient has decreased range of motion with flexion.  No lower extremity tenderness nor edema.  Patient had negative straight leg test.  No joint effusions. Neurologic:  Normal speech and language. No gross focal neurologic deficits are appreciated. No gait instability. Skin:  Skin is warm, dry and intact. No rash noted. Psychiatric: Mood and affect are normal. Speech and behavior are normal.  ____________________________________________   LABS (all labs ordered are listed, but only abnormal results are displayed)  Labs Reviewed - No data to display ____________________________________________  EKG   ____________________________________________  RADIOLOGY I, Sable Feil, personally viewed and evaluated these images (plain radiographs) as part of my medical decision making, as well as reviewing the written report by the radiologist.  ED MD interpretation:    Official radiology report(s): No results found.  ____________________________________________   PROCEDURES  Procedure(s) performed (including Critical  Care):  Procedures   ____________________________________________   INITIAL IMPRESSION / ASSESSMENT AND PLAN / ED COURSE  As part of my medical decision making, I reviewed the following data within the electronic MEDICAL RECORD NUMBER   Low back pain secondary to degenerative changes.  Discussed x-ray findings from previous visit with patient.  Patient state he was not aware that he had arthritis in his back.  Patient [facility].  Patient given a prescription for Norflex and naproxen.  Patient given a work note for 3 days.          ____________________________________________   FINAL CLINICAL IMPRESSION(S) / ED DIAGNOSES  Final diagnoses:  Osteoarthritis of lumbar spine with myelopathy     ED Discharge Orders          Ordered    orphenadrine (NORFLEX) 100 MG tablet  2 times daily        04/01/21  0908    naproxen (NAPROSYN) 500 MG tablet  2 times daily with meals        04/01/21 0908             Note:  This document was prepared using Dragon voice recognition software and may include unintentional dictation errors.    Sable Feil, PA-C 04/01/21 3354    Vladimir Crofts, MD 04/01/21 [phone]

## 2021-04-01 NOTE — Discharge Instructions (Addendum)
Read and follow discharge care instruction.  Take medication as directed.  Follow-up with PCP for continued care

## 2021-04-04 ENCOUNTER — Encounter: Payer: Self-pay | Admitting: Gastroenterology

## 2021-04-05 ENCOUNTER — Ambulatory Visit: Payer: Self-pay | Admitting: Anesthesiology

## 2021-04-05 ENCOUNTER — Encounter: Payer: Self-pay | Admitting: Gastroenterology

## 2021-04-05 ENCOUNTER — Encounter
Admission: RE | Disposition: A | Discharge status: Home / Self Care | Payer: Self-pay | Source: Home / Self Care | Attending: Gastroenterology

## 2021-04-05 ENCOUNTER — Other Ambulatory Visit: Payer: Self-pay

## 2021-04-05 ENCOUNTER — Ambulatory Visit
Admission: RE | Admit: 2021-04-05 | Discharge: 2021-04-05 | Disposition: A | Discharge status: Home / Self Care | Payer: Self-pay | Attending: Gastroenterology | Admitting: Gastroenterology

## 2021-04-05 DIAGNOSIS — Z1211 Encounter for screening for malignant neoplasm of colon: Secondary | ICD-10-CM | POA: Insufficient documentation

## 2021-04-05 DIAGNOSIS — K635 Polyp of colon: Secondary | ICD-10-CM | POA: Insufficient documentation

## 2021-04-05 DIAGNOSIS — F17210 Nicotine dependence, cigarettes, uncomplicated: Secondary | ICD-10-CM | POA: Insufficient documentation

## 2021-04-05 DIAGNOSIS — E119 Type 2 diabetes mellitus without complications: Secondary | ICD-10-CM | POA: Insufficient documentation

## 2021-04-05 DIAGNOSIS — J449 Chronic obstructive pulmonary disease, unspecified: Secondary | ICD-10-CM | POA: Insufficient documentation

## 2021-04-05 DIAGNOSIS — I10 Essential (primary) hypertension: Secondary | ICD-10-CM | POA: Insufficient documentation

## 2021-04-05 DIAGNOSIS — D124 Benign neoplasm of descending colon: Secondary | ICD-10-CM | POA: Insufficient documentation

## 2021-04-05 HISTORY — PX: COLONOSCOPY WITH PROPOFOL: SHX5780

## 2021-04-05 SURGERY — COLONOSCOPY WITH PROPOFOL
Anesthesia: General

## 2021-04-05 MED ORDER — SODIUM CHLORIDE 0.9 % IV SOLN: INTRAVENOUS | Status: DC

## 2021-04-05 MED ORDER — DEXMEDETOMIDINE HCL IN NACL 200 MCG/50ML IV SOLN
INTRAVENOUS | Status: DC | PRN
  Administered 2021-04-05: 8 ug via INTRAVENOUS

## 2021-04-05 MED ORDER — PROPOFOL 500 MG/50ML IV EMUL
INTRAVENOUS | Status: AC
  Filled 2021-04-05: qty 50

## 2021-04-05 MED ORDER — PROPOFOL 10 MG/ML IV BOLUS
INTRAVENOUS | Status: DC | PRN
  Administered 2021-04-05: 100 mg via INTRAVENOUS

## 2021-04-05 MED ORDER — PROPOFOL 500 MG/50ML IV EMUL
INTRAVENOUS | Status: DC | PRN
  Administered 2021-04-05: 130 ug/kg/min via INTRAVENOUS

## 2021-04-05 MED ORDER — LIDOCAINE HCL (CARDIAC) PF 100 MG/5ML IV SOSY
PREFILLED_SYRINGE | INTRAVENOUS | Status: DC | PRN
  Administered 2021-04-05: 50 mg via INTRAVENOUS

## 2021-04-05 NOTE — Op Note (Signed)
[facility] Gastroenterology Patient Name: Christopher Harrell Procedure Date: 04/05/2021 9:05 AM MRN: [id number] Account #: [id number] Date of Birth: February 05, 1971 Admit Type: Outpatient Age: 50 Room: [facility] ENDO ROOM 4 Gender: Male Note Status: Finalized Instrument Name: Jasper Riling [id number] Procedure:             Colonoscopy Indications:           Screening for colorectal malignant neoplasm Providers:             Lucilla Lame MD, MD Referring MD:          No Local Md, MD (Referring MD) Medicines:             Propofol per Anesthesia Complications:         No immediate complications. Procedure:             Pre-Anesthesia Assessment:                        - Prior to the procedure, a History and Physical was                         performed, and patient medications and allergies were                         reviewed. The patient's tolerance of previous                         anesthesia was also reviewed. The risks and benefits                         of the procedure and the sedation options and risks                         were discussed with the patient. All questions were                         answered, and informed consent was obtained. Prior                         Anticoagulants: The patient has taken no previous                         anticoagulant or antiplatelet agents. ASA Grade                         Assessment: II - A patient with mild systemic disease.                         After reviewing the risks and benefits, the patient                         was deemed in satisfactory condition to undergo the                         procedure.                        After obtaining informed consent, the colonoscope was  passed under direct vision. Throughout the procedure,                         the patient's blood pressure, pulse, and oxygen                         saturations were monitored continuously. The                          Colonoscope was introduced through the anus and                         advanced to the the cecum, identified by appendiceal                         orifice and ileocecal valve. The colonoscopy was                         performed without difficulty. The patient tolerated                         the procedure well. The quality of the bowel                         preparation was excellent. Findings:      The perianal and digital rectal examinations were normal.      A 3 mm polyp was found in the cecum. The polyp was sessile. The polyp       was removed with a cold snare. Resection and retrieval were complete.      Two sessile polyps were found in the descending colon. The polyps were 3       to 4 mm in size. These polyps were removed with a cold snare. Resection       and retrieval were complete. Impression:            - One 3 mm polyp in the cecum, removed with a cold                         snare. Resected and retrieved.                        - Two 3 to 4 mm polyps in the descending colon,                         removed with a cold snare. Resected and retrieved. Recommendation:        - Discharge patient to home.                        - Resume previous diet.                        - Continue present medications.                        - If the pathology report reveals adenomatous tissue,                         then repeat the colonoscopy for surveillance in 5  years. Procedure Code(s):     --- Professional ---                        [phone], Colonoscopy, flexible; with removal of                         tumor(s), polyp(s), or other lesion(s) by snare                         technique Diagnosis Code(s):     --- Professional ---                        Z12.11, Encounter for screening for malignant neoplasm                         of colon                        K63.5, Polyp of colon CPT copyright 2019 [employer] Association. All rights reserved. The  codes documented in this report are preliminary and upon coder review may  be revised to meet current compliance requirements. Lucilla Lame MD, MD 04/05/2021 9:41:04 AM This report has been signed electronically. Number of Addenda: 0 Note Initiated On: 04/05/2021 9:05 AM Scope Withdrawal Time: 0 hours 9 minutes 51 seconds  Total Procedure Duration: 0 hours 17 minutes 20 seconds  Estimated Blood Loss:  Estimated blood loss: none.      [facility]

## 2021-04-05 NOTE — Anesthesia Postprocedure Evaluation (Signed)
Anesthesia Post Note  Patient: Christopher Harrell  Procedure(s) Performed: COLONOSCOPY WITH PROPOFOL  Patient location during evaluation: PACU Anesthesia Type: General Level of consciousness: awake and alert Pain management: pain level controlled Vital Signs Assessment: post-procedure vital signs reviewed and stable Respiratory status: spontaneous breathing, nonlabored ventilation, respiratory function stable and patient connected to nasal cannula oxygen Cardiovascular status: blood pressure returned to baseline and stable Postop Assessment: no apparent nausea or vomiting Anesthetic complications: no   No notable events documented.   Last Vitals:  Vitals:   04/05/21 0944 04/05/21 0953  BP: (!) 123/91   Pulse: 76 73  Resp: 16 17  Temp:    SpO2: 96% 98%    Last Pain:  Vitals:   04/05/21 1003  TempSrc:   PainSc: 0-No pain                 [place]

## 2021-04-05 NOTE — Anesthesia Preprocedure Evaluation (Addendum)
Anesthesia Evaluation  Patient identified by MRN, date of birth, ID band Patient awake    Reviewed: Allergy & Precautions, NPO status , Patient's Chart, lab work & pertinent test results, reviewed documented beta blocker date and time   History of Anesthesia Complications Negative for: history of anesthetic complications  Airway Mallampati: II  TM Distance: >3 FB Neck ROM: Full    Dental   Pulmonary COPD, Current Smoker and Patient abstained from smoking.,    Pulmonary exam normal        Cardiovascular hypertension (hx hypertensive urgency, poorly controlled on 3 medications), Normal cardiovascular exam    Denies CP/SOB/HA/vision changes   Neuro/Psych   Low back pain negative psych ROS   GI/Hepatic negative GI ROS, Neg liver ROS,   Endo/Other  diabetes, Type 2  Renal/GU      Musculoskeletal negative musculoskeletal ROS (+)   Abdominal   Peds  Hematology negative hematology ROS (+)   Anesthesia Other Findings EKG 01/29/20 Normal sinus rhythm Septal infarct , age undetermined  Echo 2017 Severe LVH with the mild LV dysfunction and mild diastolic  dysfunction with mild mitral regurgitation and trace tricuspid  regurgitation. Mild diffuse hypokinesis.   Stress 2017 There was no ST segment deviation noted during stress. The study is normal. This is a low risk study. The left ventricular ejection fraction is normal (55-65%).  Reproductive/Obstetrics                            Anesthesia Physical Anesthesia Plan  ASA: 3  Anesthesia Plan: General   Post-op Pain Management:    Induction:   PONV Risk Score and Plan:   Airway Management Planned: Natural Airway  Additional Equipment:   Intra-op Plan:   Post-operative Plan:   Informed Consent: I have reviewed the patients History and Physical, chart, labs and discussed the procedure including the risks, benefits and  alternatives for the proposed anesthesia with the patient or authorized representative who has indicated his/her understanding and acceptance.       Plan Discussed with: CRNA  Anesthesia Plan Comments: (Patient states he frequently runs Bps in the 170-180 range, with diastolics > 466.  Denies symptoms. May require treatment during procedure or in PACU)        Anesthesia Quick Evaluation

## 2021-04-05 NOTE — H&P (Signed)
Lucilla Lame, MD [address] [place], [place] [address] Phone: [phone] Fax : [phone]  Primary Care Physician:  Langston Reusing, NP Primary Gastroenterologist:  Dr. Allen Norris  Pre-Procedure History & Physical: HPI:  Christopher Harrell is a 50 y.o. male is here for a screening colonoscopy.   Past Medical History:  Diagnosis Date   Community acquired pneumonia    a. Dx 01/21/2016 - LLL PNA.   ED (erectile dysfunction)    Essential hypertension    Hyperlipidemia    Tobacco abuse     Past Surgical History:  Procedure Laterality Date   NO PAST SURGERIES      Prior to Admission medications   Medication Sig Start Date End Date Taking? Authorizing Provider  albuterol (VENTOLIN HFA) 108 (90 Base) MCG/ACT inhaler Inhale 2 puffs into the lungs every 6 (six) hours as needed for wheezing or shortness of breath. 10/22/74  Yes Copland, Elmo Putt B, PA-C  amLODipine (NORVASC) 10 MG tablet Take 1 tablet (10 mg total) by mouth daily. 28/31/51  Yes Copland, Alicia B, PA-C  atenolol (TENORMIN) 50 MG tablet Take 1 tablet (50 mg total) by mouth daily. 02/17/21 76/16/07 Yes Copland, Deirdre Evener, PA-C  atorvastatin (LIPITOR) 10 MG tablet Take 1 tablet (10 mg total) by mouth daily. 37/10/62  Yes Copland, Deirdre Evener, PA-C  Multiple Vitamin (MULTIVITAMIN WITH MINERALS) TABS tablet Take 1 tablet by mouth daily.   Yes [provider]  naproxen (NAPROSYN) 500 MG tablet Take 1 tablet (500 mg total) by mouth 2 (two) times daily with a meal. 04/01/21  Yes Sable Feil, PA-C  orphenadrine (NORFLEX) 100 MG tablet Take 1 tablet (100 mg total) by mouth 2 (two) times daily. 04/01/21  Yes Sable Feil, PA-C  triamterene-hydrochlorothiazide (DYAZIDE) 37.5-25 MG capsule Take 1 each (1 capsule total) by mouth daily. 69/48/54  Yes Copland, Deirdre Evener, PA-C  aspirin EC 81 MG tablet Take 1 tablet (81 mg total) by mouth daily. Patient not taking: No sig reported 07/16/19   Iloabachie, Chioma E, NP   predniSONE (STERAPRED UNI-PAK 21 TAB) 10 MG (21) TBPK tablet Take 6 pills on day one then decrease by 1 pill each day 03/14/21   Versie Starks, PA-C  tadalafil (CIALIS) 5 MG tablet Take 1-2 tablets (5-10 mg total) by mouth daily as needed for erectile dysfunction. 10/07/20   Billey Co, MD    Allergies as of 03/16/2021   (No Known Allergies)    Family History  Problem Relation Age of Onset   Hypertension Mother    Lung cancer Mother    Hyperlipidemia Mother    Heart attack Father 76   Heart attack Maternal Grandmother     Social History   Socioeconomic History   Marital status: Legally Separated    Spouse name: Not on file   Number of children: Not on file   Years of education: Not on file   Highest education level: Not on file  Occupational History   Occupation: Picture framer    Comment: [employer]  Tobacco Use   Smoking status: Every Day    Packs/day: 0.50    Years: 20.00    Pack years: 10.00    Types: Cigarettes   Smokeless tobacco: Never   Tobacco comments:    Tried gum.  Vaping Use   Vaping Use: Never used  Substance and Sexual Activity   Alcohol use: Not Currently   Drug use: No   Sexual activity: Yes  Birth control/protection: None  Other Topics Concern   Not on file  Social History Narrative   No longer living with wife. Walking for 20 mins per day   Social Determinants of Health   Financial Resource Strain: Not on file  Food Insecurity: No Food Insecurity   Worried About Charity fundraiser in the Last Year: Never true   Arboriculturist in the Last Year: Never true  Transportation Needs: No Transportation Needs   Lack of Transportation (Medical): No   Lack of Transportation (Non-Medical): No  Physical Activity: Not on file  Stress: Not on file  Social Connections: Not on file  Intimate Partner Violence: Not on file    Review of Systems: See HPI, otherwise negative ROS  Physical Exam: BP (!) 177/110   Pulse 65   Temp (!) 97.2  F (36.2 C) (Temporal)   Resp 16   SpO2 100%  General:   Alert,  pleasant and cooperative in NAD Head:  Normocephalic and atraumatic. Neck:  Supple; no masses or thyromegaly. Lungs:  Clear throughout to auscultation.    Heart:  Regular rate and rhythm. Abdomen:  Soft, nontender and nondistended. Normal bowel sounds, without guarding, and without rebound.   Neurologic:  Alert and  oriented x4;  grossly normal neurologically.  Impression/Plan: Sameer Teeple is now here to undergo a screening colonoscopy.  Risks, benefits, and alternatives regarding colonoscopy have been reviewed with the patient.  Questions have been answered.  All parties agreeable.

## 2021-04-05 NOTE — Transfer of Care (Signed)
Immediate Anesthesia Transfer of Care Note  Patient: Christopher Harrell  Procedure(s) Performed: COLONOSCOPY WITH PROPOFOL  Patient Location: PACU and Endoscopy Unit  Anesthesia Type:General  Level of Consciousness: drowsy and patient cooperative  Airway & Oxygen Therapy: Patient Spontanous Breathing  Post-op Assessment: Report given to RN and Post -op Vital signs reviewed and stable  Post vital signs: Reviewed and stable  Last Vitals:  Vitals Value Taken Time  BP 123/91 04/05/21 0944  Temp    Pulse 81 04/05/21 0944  Resp 22 04/05/21 0944  SpO2 96 % 04/05/21 0944  Vitals shown include unvalidated device data.  Last Pain:  Vitals:   04/05/21 0943  TempSrc:   PainSc: 0-No pain         Complications: No notable events documented.

## 2021-04-06 ENCOUNTER — Encounter: Payer: Self-pay | Admitting: Gastroenterology

## 2021-04-06 LAB — SURGICAL PATHOLOGY

## 2021-04-07 ENCOUNTER — Telehealth: Payer: Self-pay | Admitting: *Deleted

## 2021-04-07 ENCOUNTER — Encounter: Payer: Self-pay | Admitting: Gastroenterology

## 2021-04-07 DIAGNOSIS — N529 Male erectile dysfunction, unspecified: Secondary | ICD-10-CM

## 2021-04-07 NOTE — Telephone Encounter (Addendum)
Patient called Triage to request increase or alternate medication for his ED. Per patient he has been taking up to 6 tablets prior to intercourse.

## 2021-04-08 MED ORDER — SILDENAFIL CITRATE 100 MG PO TABS: 100.0000 mg | ORAL_TABLET | Freq: Every day | ORAL | 11 refills | Status: DC | PRN

## 2021-04-08 NOTE — Telephone Encounter (Signed)
Patient informed per Dr. Doristine Counter recommendation below. Sent to [facility] as requested and [place] coupon. Scheduled lab and follow up. Patient voiced understanding.   OK to try sildenafil 100mg  on demand, 15 tabs/11 refills, remind him of goodrx, and rtc with [facility] with AM testosterone prior.  Nickolas Madrid, MD  04/08/2021

## 2021-04-18 ENCOUNTER — Encounter: Payer: Self-pay | Admitting: Emergency Medicine

## 2021-04-18 ENCOUNTER — Other Ambulatory Visit: Payer: Self-pay

## 2021-04-18 ENCOUNTER — Other Ambulatory Visit: Payer: Self-pay | Admitting: Obstetrics and Gynecology

## 2021-04-18 ENCOUNTER — Emergency Department
Admission: EM | Admit: 2021-04-18 | Discharge: 2021-04-18 | Disposition: A | Discharge status: Home / Self Care | Payer: Self-pay | Attending: Emergency Medicine | Admitting: Emergency Medicine

## 2021-04-18 DIAGNOSIS — Z7982 Long term (current) use of aspirin: Secondary | ICD-10-CM | POA: Insufficient documentation

## 2021-04-18 DIAGNOSIS — M545 Low back pain, unspecified: Secondary | ICD-10-CM

## 2021-04-18 DIAGNOSIS — J449 Chronic obstructive pulmonary disease, unspecified: Secondary | ICD-10-CM | POA: Insufficient documentation

## 2021-04-18 DIAGNOSIS — Z79899 Other long term (current) drug therapy: Secondary | ICD-10-CM | POA: Insufficient documentation

## 2021-04-18 DIAGNOSIS — G8929 Other chronic pain: Secondary | ICD-10-CM | POA: Insufficient documentation

## 2021-04-18 DIAGNOSIS — I10 Essential (primary) hypertension: Secondary | ICD-10-CM | POA: Insufficient documentation

## 2021-04-18 DIAGNOSIS — F17210 Nicotine dependence, cigarettes, uncomplicated: Secondary | ICD-10-CM | POA: Insufficient documentation

## 2021-04-18 LAB — CBG MONITORING, ED: Glucose-Capillary: 117 mg/dL — ABNORMAL HIGH (ref 70–99)

## 2021-04-18 MED ORDER — NAPROXEN 500 MG PO TABS: 500.0000 mg | ORAL_TABLET | Freq: Two times a day (BID) | ORAL | 0 refills | Status: DC

## 2021-04-18 MED ORDER — CYCLOBENZAPRINE HCL 5 MG PO TABS
5.0000 mg | ORAL_TABLET | Freq: Three times a day (TID) | ORAL | 0 refills | Status: DC | PRN
Start: 2021-04-18 — End: 2021-08-24

## 2021-04-18 MED ORDER — CYCLOBENZAPRINE HCL 10 MG PO TABS
5.0000 mg | ORAL_TABLET | Freq: Once | ORAL | Status: AC
  Administered 2021-04-18: 08:00:00 5 mg via ORAL
  Filled 2021-04-18: qty 1

## 2021-04-18 MED ORDER — KETOROLAC TROMETHAMINE 30 MG/ML IJ SOLN
30.0000 mg | Freq: Once | INTRAMUSCULAR | Status: AC
  Administered 2021-04-18: 08:00:00 30 mg via INTRAMUSCULAR
  Filled 2021-04-18: qty 1

## 2021-04-18 NOTE — ED Notes (Signed)
Says he used to be on diabetes meds and was taken off.  Wants sugar checked.

## 2021-04-18 NOTE — Discharge Instructions (Signed)
[facility].  You may use ice or heat to your back as needed.  A prescription for a muscle relaxant and anti-inflammatory pain medication was sent to the pharmacy.  Do not take the muscle relaxant and drive or operate machinery as it could cause drowsiness and increase your risk for injury.

## 2021-04-18 NOTE — ED Provider Notes (Signed)
[facility] Emergency Department Provider    ____________________________________________   Event Date/Time   First MD Initiated Contact with Patient 04/18/21 [phone]     (approximate)  I have reviewed the triage vital signs and the nursing notes.   HISTORY  Chief Complaint Back Pain   HPI Christopher Harrell is a 50 y.o. male presents to the ED with complaint of low back pain for approximately 2 months without history of injury.  He denies any recent injury and has history of chronic back pain.  Patient states that [facility] will [facility] of next [facility].  He reports he has taken over-the-counter medication without any relief.         Past Medical History:  Diagnosis Date   Community acquired pneumonia    a. Dx 01/21/2016 - LLL PNA.   ED (erectile dysfunction)    Essential hypertension    Hyperlipidemia    Tobacco abuse     Patient Active Problem List   Diagnosis Date Noted   Colon cancer screening    Polyp of descending colon    Vitamin D deficiency 12/09/2020   Chronic midline low back pain with bilateral sciatica 06/10/2020   Back pain 05/13/2020   Essential hypertension 08/13/2019   Erectile dysfunction 06/25/2019   Acute flank pain 06/25/2019   Tooth abscess 04/30/2019   Smoking 04/30/2019   Hypertriglyceridemia 06/05/2017   COPD with acute exacerbation ([place]) 10/14/2016   Pneumonia 10/14/2016   Hypertensive urgency, malignant 06/13/2016   Tobacco abuse 06/13/2016   Community acquired pneumonia 01/23/2016   Elevated troponin 01/23/2016   HTN (hypertension) 01/23/2016   Chest pain 01/23/2016    Past Surgical History:  Procedure Laterality Date   COLONOSCOPY WITH PROPOFOL N/A 04/05/2021   Procedure: COLONOSCOPY WITH PROPOFOL;  Surgeon: Lucilla Lame, MD;  Location: [facility] ENDOSCOPY;  Service: Endoscopy;  Laterality: N/A;   NO PAST SURGERIES      Prior to Admission medications   Medication Sig Start  Date End Date Taking? Authorizing Provider  cyclobenzaprine (FLEXERIL) 5 MG tablet Take 1 tablet (5 mg total) by mouth 3 (three) times daily as needed for muscle spasms. 04/18/21  Yes Letitia Neri L, PA-C  naproxen (NAPROSYN) 500 MG tablet Take 1 tablet (500 mg total) by mouth 2 (two) times daily with a meal. 04/18/21  Yes Letitia Neri L, PA-C  albuterol (VENTOLIN HFA) 108 (90 Base) MCG/ACT inhaler Inhale 2 puffs into the lungs every 6 (six) hours as needed for wheezing or shortness of breath. 05/02/70   Copland, Elmo Putt B, PA-C  amLODipine (NORVASC) 10 MG tablet Take 1 tablet (10 mg total) by mouth daily. 53/66/44   Copland, Deirdre Evener, PA-C  aspirin EC 81 MG tablet Take 1 tablet (81 mg total) by mouth daily. Patient not taking: No sig reported 07/16/19   Iloabachie, Chioma E, NP  atenolol (TENORMIN) 50 MG tablet Take 1 tablet (50 mg total) by mouth daily. 02/17/21 03/47/42  Copland, Deirdre Evener, PA-C  atorvastatin (LIPITOR) 10 MG tablet Take 1 tablet (10 mg total) by mouth daily. 59/56/38   Copland, Deirdre Evener, PA-C  Multiple Vitamin (MULTIVITAMIN WITH MINERALS) TABS tablet Take 1 tablet by mouth daily.    [provider]  sildenafil (VIAGRA) 100 MG tablet Take 1 tablet (100 mg total) by mouth daily as needed for erectile dysfunction. 04/08/21   Billey Co, MD  triamterene-hydrochlorothiazide (DYAZIDE) 37.5-25 MG capsule Take 1 each (1 capsule total) by  mouth daily. 78/93/81   Copland, Deirdre Evener, PA-C    Allergies Patient has no known allergies.  Family History  Problem Relation Age of Onset   Hypertension Mother    Lung cancer Mother    Hyperlipidemia Mother    Heart attack Father 99   Heart attack Maternal Grandmother     Social History Social History   Tobacco Use   Smoking status: Every Day    Packs/day: 0.50    Years: 20.00    Pack years: 10.00    Types: Cigarettes   Smokeless tobacco: Never   Tobacco comments:    Tried gum.  Vaping Use   Vaping Use: Never used   Substance Use Topics   Alcohol use: Not Currently   Drug use: No    Review of Systems Constitutional: No fever/chills Eyes: No visual changes. ENT: No sore throat. Cardiovascular: Denies chest pain. Respiratory: Denies shortness of breath. Gastrointestinal: No abdominal pain.  No nausea, no vomiting.  No diarrhea.  No constipation. Genitourinary: Negative for dysuria. Musculoskeletal: Positive low back pain, chronic. Skin: Negative for rash. Neurological: Negative for headaches, focal weakness or numbness.  ____________________________________________   PHYSICAL EXAM:  VITAL SIGNS: ED Triage Vitals  Enc Vitals Group     BP      Pulse      Resp      Temp      Temp src      SpO2      Weight      Height      Head Circumference      Peak Flow      Pain Score      Pain Loc      Pain Edu?      Excl. in [place]?     Constitutional: Alert and oriented. Well appearing and in no acute distress. Eyes: Conjunctivae are normal.  Head: Atraumatic. Neck: No stridor.   Cardiovascular: Normal rate, regular rhythm. Grossly normal heart sounds.  Good peripheral circulation. Respiratory: Normal respiratory effort.  No retractions. Lungs CTAB. Gastrointestinal: Soft and nontender. No distention.  No CVA tenderness. Musculoskeletal: On examination of the back there is no gross deformity however there is generalized tenderness on palpation of the L5-S1 area and paravertebral muscles bilaterally.  No active muscle spasms were seen.  Good muscle strength and patient is able to stand and ambulate without any assistance. Neurologic:  Normal speech and language. No gross focal neurologic deficits are appreciated. No gait instability. Skin:  Skin is warm, dry and intact. No rash noted. Psychiatric: Mood and affect are normal. Speech and behavior are normal.  ____________________________________________   LABS (all labs ordered are listed, but only abnormal results are displayed)  Labs  Reviewed  CBG MONITORING, ED - Abnormal; Notable for the following components:      Result Value   Glucose-Capillary 117 (*)    All other components within normal limits     PROCEDURES  Procedure(s) performed (including Critical Care):  Procedures   ____________________________________________   INITIAL IMPRESSION / ASSESSMENT AND PLAN / ED COURSE  As part of my medical decision making, I reviewed the following data within the electronic MEDICAL RECORD NUMBER Notes from prior ED visits and [place] Controlled Substance Database  50 year old male presents to the ED with history of chronic low back pain without any relief with over-the-counter medication.  He states that [facility].  He states that this is his normal low  back pain and in the past has had an injection which helped his back pain.  He is concerned that his blood sugar may be elevated as he was taken off his diabetes medication.  He states that he has been n.p.o. since last night.  Glucose fingerstick was 117 and patient was reassured.  He was given a Toradol injection 30 mg IV M, Flexeril 5 mg while in the emergency department.  A prescription for naproxen and Flexeril was sent to his pharmacy.  Patient is aware that he cannot take the muscle relaxant while driving or operating machinery due to drowsiness.  He is encouraged to [facility] for [facility] of his diabetes and his back pain.  ____________________________________________   FINAL CLINICAL IMPRESSION(S) / ED DIAGNOSES  Final diagnoses:  Chronic bilateral low back pain without sciatica     ED Discharge Orders          Ordered    cyclobenzaprine (FLEXERIL) 5 MG tablet  3 times daily PRN        04/18/21 0828    naproxen (NAPROSYN) 500 MG tablet  2 times daily with meals        04/18/21 9458             Note:  This document was prepared using Dragon voice recognition  software and may include unintentional dictation errors.    Johnn Hai, PA-C 04/18/21 1151    Blake Divine, MD 04/19/21 [phone]

## 2021-04-18 NOTE — ED Triage Notes (Signed)
Pt c/o lower back pain and sts, "I feel like my bone is coming out back there. I couldn't even sleep." Pt denies injury and sts he has hx/o chronic back pain. Denies relief from OTC meds at home.

## 2021-04-18 NOTE — ED Notes (Signed)
Low back pain for about 2 months.  Says he has joint pains, legs/ankles.  [facility] but [facility] not for a few more weeks.  Says he says his back is swollen.

## 2021-04-28 ENCOUNTER — Other Ambulatory Visit: Payer: Self-pay

## 2021-04-28 DIAGNOSIS — N529 Male erectile dysfunction, unspecified: Secondary | ICD-10-CM

## 2021-04-29 LAB — TESTOSTERONE: Testosterone: 306 ng/dL (ref 264–916)

## 2021-05-03 ENCOUNTER — Ambulatory Visit: Payer: Self-pay | Admitting: Urology

## 2021-05-03 NOTE — Progress Notes (Signed)
05/04/2021 1:47 PM   Christopher Harrell 1970-07-29 [id number]  Referring provider: Langston Reusing, NP [address] [place] Har[place],  [place] Har[place] [id number]  Chief Complaint  Patient presents with   Erectile Dysfunction   Urological history: 1. ED -contributing factors of age, HTN, HLD, diabetes and smoking -testosterone level 306 03/2021 -SHIM 22 -managed with sildenafil 100 mg, on-demand-dosing  2. BPH -PSA 1.1 06/2019  HPI: Christopher Harrell is a 51 y.o. male who presents today after a trial of PDE5i's for ED.  He is also having issues with nocturia x 5.  He has been experiencing this for about one year.  He has not been diagnosed with sleep apnea, but he has not been tested.  PVR 72 mL   Patient denies any modifying or aggravating factors.  Patient denies any gross hematuria, dysuria or suprapubic/flank pain.  Patient denies any fevers, chills, nausea or vomiting.    Patient still having spontaneous erections.  He denies any pain or curvature with erections.   He is at goal with the sildenafil 100 mg daily.     SHIM     Row Name 05/04/21 1317         SHIM: Over the last 6 months:   How do you rate your confidence that you could get and keep an erection? High     When you had erections with sexual stimulation, how often were your erections hard enough for penetration (entering your partner)? Almost Always or Always     During sexual intercourse, how often were you able to maintain your erection after you had penetrated (entered) your partner? Most Times (much more than half the time)     During sexual intercourse, how difficult was it to maintain your erection to completion of intercourse? Not Difficult     When you attempted sexual intercourse, how often was it satisfactory for you? Most Times (much more than half the time)       SHIM Total Score   SHIM 22              Score: 1-7 Severe ED 8-11 Moderate ED 12-16 Mild-Moderate ED 17-21 Mild ED 22-25 No ED      PMH: Past Medical History:  Diagnosis Date   Community acquired pneumonia    a. Dx 01/21/2016 - LLL PNA.   ED (erectile dysfunction)    Essential hypertension    Hyperlipidemia    Tobacco abuse     Surgical History: Past Surgical History:  Procedure Laterality Date   COLONOSCOPY WITH PROPOFOL N/A 04/05/2021   Procedure: COLONOSCOPY WITH PROPOFOL;  Surgeon: Lucilla Lame, MD;  Location: [facility] ENDOSCOPY;  Service: Endoscopy;  Laterality: N/A;   NO PAST SURGERIES      Home Medications:  Allergies as of 05/04/2021   No Known Allergies      Medication List        Accurate as of May 04, 2021  1:47 PM. If you have any questions, ask your nurse or doctor.          albuterol 108 (90 Base) MCG/ACT inhaler Commonly known as: VENTOLIN HFA Inhale 2 puffs into the lungs every 6 (six) hours as needed for wheezing or shortness of breath.   amLODipine 10 MG tablet Commonly known as: NORVASC Take 1 tablet (10 mg total) by mouth daily.   aspirin EC 81 MG tablet Take 1 tablet (81 mg total) by mouth daily.   atenolol 50 MG tablet Commonly known as: Tenormin Take  1 tablet (50 mg total) by mouth daily.   atorvastatin 10 MG tablet Commonly known as: LIPITOR Take 1 tablet (10 mg total) by mouth daily.   cyclobenzaprine 5 MG tablet Commonly known as: FLEXERIL Take 1 tablet (5 mg total) by mouth 3 (three) times daily as needed for muscle spasms.   multivitamin with minerals Tabs tablet Take 1 tablet by mouth daily.   naproxen 500 MG tablet Commonly known as: Naprosyn Take 1 tablet (500 mg total) by mouth 2 (two) times daily with a meal.   sildenafil 100 MG tablet Commonly known as: VIAGRA Take 1 tablet (100 mg total) by mouth daily as needed for erectile dysfunction.   tamsulosin 0.4 MG Caps capsule Commonly known as: FLOMAX Take 1 capsule (0.4 mg total) by mouth daily. Started by: Christopher Council, PA-C   triamterene-hydrochlorothiazide 37.5-25 MG capsule Commonly  known as: DYAZIDE Take 1 each (1 capsule total) by mouth daily.        Allergies: No Known Allergies  Family History: Family History  Problem Relation Age of Onset   Hypertension Mother    Lung cancer Mother    Hyperlipidemia Mother    Heart attack Father 62   Heart attack Maternal Grandmother     Social History:  reports that he has been smoking cigarettes. He has a 10.00 pack-year smoking history. He has never used smokeless tobacco. He reports that he does not currently use alcohol. He reports that he does not use drugs.  ROS: Pertinent ROS in HPI  Physical Exam: BP (!) 164/102    Pulse 64    Ht 5\' 9"  (1.753 m)    Wt 219 lb (99.3 kg)    BMI 32.34 kg/m   Constitutional:  Well nourished. Alert and oriented, No acute distress. HEENT: [place] AT, mask in place.  Trachea midline Cardiovascular: No clubbing, cyanosis, or edema. Respiratory: Normal respiratory effort, no increased work of breathing. Neurologic: Grossly intact, no focal deficits, moving all 4 extremities. Psychiatric: Normal mood and affect.  Laboratory Data: Lab Results  Component Value Date   WBC 6.1 12/09/2020   HGB 16.5 12/09/2020   HCT 47.6 12/09/2020   MCV 87 12/09/2020   PLT 277 12/09/2020    Lab Results  Component Value Date   CREATININE 1.11 12/09/2020    Lab Results  Component Value Date   TESTOSTERONE 306 04/28/2021    Lab Results  Component Value Date   HGBA1C 5.4 05/27/2020    Lab Results  Component Value Date   TSH 1.110 06/13/2016       Component Value Date/Time   CHOL 169 12/09/2020 1639   HDL 44 12/09/2020 1639   CHOLHDL 3.8 12/09/2020 1639   LDLCALC 94 12/09/2020 1639    Lab Results  Component Value Date   AST 30 12/09/2020   Lab Results  Component Value Date   ALT 25 12/09/2020  I have reviewed the labs.   Pertinent Imaging:  05/04/21 13:39  Scan Result 73mL    Assessment & Plan:    1. Erectile dysfunction -At goal with sildenafil 100 mg -Continue  sildenafil 100 mg on demand dosing  2. Nocturia - I explained to the patient that nocturia is often multi-factorial and difficult to treat.  Sleeping disorders, heart conditions, peripheral vascular disease, diabetes, an enlarged prostate for men, an urethral stricture causing bladder outlet obstruction and/or certain medications can contribute to nocturia. - I have suggested that the patient avoid caffeine after noon and alcohol in the evening.  He or she may also benefit from fluid restrictions after 6:00 in the evening and voiding just prior to bedtime. - I have explained that research studies have showed that over 84% of patients with sleep apnea reported frequent nighttime urination.   With sleep apnea, oxygen decreases, carbon dioxide increases, the blood become more acidic, the heart rate drops and blood vessels in the lung constrict.  The body is then alerted that something is very wrong. The sleeper must wake enough to reopen the airway. By this time, the heart is racing and experiences a false signal of fluid overload. The heart excretes a hormone-like protein that tells the body to get rid of sodium and water, resulting in nocturia. -  I also informed the patient that a recent study noted that decreasing sodium intake to 2.3 grams daily, if they don't have issues with hyponatremia, can also reduce the number of nightly voids - The patient may benefit from a discussion with his or her primary care physician to see if he or she has risk factors for sleep apnea or other sleep disturbances and obtaining a sleep study.  -start tamsulosin 0.4 mg daily-prescription sent to [facility] so he could use good Rx coupon -will have the [facility] draw the PSA at the end of the month  Return in about 1 month (around 06/04/2021) for I PSS .  These notes generated with voice recognition software. I apologize for typographical errors.  Christopher Council, PA-C  [facility] [address]   [place], [place] [address] [phone]

## 2021-05-04 ENCOUNTER — Ambulatory Visit (INDEPENDENT_AMBULATORY_CARE_PROVIDER_SITE_OTHER): Payer: Self-pay | Admitting: Urology

## 2021-05-04 ENCOUNTER — Encounter: Payer: Self-pay | Admitting: Urology

## 2021-05-04 ENCOUNTER — Emergency Department
Admission: EM | Admit: 2021-05-04 | Discharge: 2021-05-04 | Disposition: A | Discharge status: Home / Self Care | Payer: Self-pay | Attending: Emergency Medicine | Admitting: Emergency Medicine

## 2021-05-04 ENCOUNTER — Other Ambulatory Visit: Payer: Self-pay

## 2021-05-04 VITALS — BP 164/102 | HR 64 | Ht 69.0 in | Wt 219.0 lb

## 2021-05-04 DIAGNOSIS — M79604 Pain in right leg: Secondary | ICD-10-CM | POA: Insufficient documentation

## 2021-05-04 DIAGNOSIS — R351 Nocturia: Secondary | ICD-10-CM

## 2021-05-04 DIAGNOSIS — N529 Male erectile dysfunction, unspecified: Secondary | ICD-10-CM

## 2021-05-04 DIAGNOSIS — Z5321 Procedure and treatment not carried out due to patient leaving prior to being seen by health care provider: Secondary | ICD-10-CM | POA: Insufficient documentation

## 2021-05-04 DIAGNOSIS — M545 Low back pain, unspecified: Secondary | ICD-10-CM | POA: Insufficient documentation

## 2021-05-04 DIAGNOSIS — M79605 Pain in left leg: Secondary | ICD-10-CM | POA: Insufficient documentation

## 2021-05-04 LAB — BLADDER SCAN AMB NON-IMAGING

## 2021-05-04 MED ORDER — TAMSULOSIN HCL 0.4 MG PO CAPS: 0.4000 mg | ORAL_CAPSULE | Freq: Every day | ORAL | 3 refills | Status: DC

## 2021-05-04 NOTE — ED Provider Triage Note (Cosign Needed)
Emergency Medicine Provider Triage Evaluation Note  Christopher Harrell , a 51 y.o. male  was evaluated in triage.  Pt complains of bilateral lower back pain that radiates into the legs. No known injury.  Review of Systems  Positive: Back pain Negative: Weakness  Physical Exam  There were no vitals taken for this visit. Gen:   Awake, no distress   Resp:  Normal effort  MSK:   Moves extremities without difficulty  Other:    Medical Decision Making  Medically screening exam initiated at 11:06 AM.  Appropriate orders placed.  Christopher Harrell was informed that the remainder of the evaluation will be completed by another provider, this initial triage assessment does not replace that evaluation, and the importance of remaining in the ED until their evaluation is complete.   Christopher Dike, FNP 05/04/21 1305

## 2021-05-04 NOTE — Patient Instructions (Addendum)
Please draw a PSA at his 05/26/2021 visit at [facility].

## 2021-05-04 NOTE — ED Triage Notes (Signed)
Pt reports lower back pain x 2 months with radiation of pain down both legs.

## 2021-05-25 ENCOUNTER — Ambulatory Visit: Payer: Self-pay | Admitting: Rheumatology

## 2021-05-25 ENCOUNTER — Encounter: Payer: Self-pay | Admitting: Rheumatology

## 2021-05-25 ENCOUNTER — Ambulatory Visit: Payer: Self-pay | Admitting: Gerontology

## 2021-05-25 ENCOUNTER — Encounter: Payer: Self-pay | Admitting: Gerontology

## 2021-05-25 ENCOUNTER — Other Ambulatory Visit: Payer: Self-pay

## 2021-05-25 VITALS — BP 169/116 | HR 69 | Temp 97.5°F | Resp 16 | Ht 69.0 in | Wt 220.2 lb

## 2021-05-25 DIAGNOSIS — E781 Pure hyperglyceridemia: Secondary | ICD-10-CM

## 2021-05-25 DIAGNOSIS — I10 Essential (primary) hypertension: Secondary | ICD-10-CM

## 2021-05-25 DIAGNOSIS — Z0000 Encounter for general adult medical examination without abnormal findings: Secondary | ICD-10-CM

## 2021-05-25 DIAGNOSIS — Z720 Tobacco use: Secondary | ICD-10-CM

## 2021-05-25 DIAGNOSIS — G8929 Other chronic pain: Secondary | ICD-10-CM

## 2021-05-25 MED ORDER — AMLODIPINE BESYLATE 10 MG PO TABS: 10.0000 mg | ORAL_TABLET | Freq: Every day | ORAL | 1 refills | Status: DC

## 2021-05-25 MED ORDER — ATORVASTATIN CALCIUM 10 MG PO TABS: 10.0000 mg | ORAL_TABLET | Freq: Every day | ORAL | 1 refills | Status: DC

## 2021-05-25 MED ORDER — ATENOLOL 50 MG PO TABS: 50.0000 mg | ORAL_TABLET | Freq: Every day | ORAL | 1 refills | Status: DC

## 2021-05-25 MED ORDER — TRIAMTERENE-HCTZ 37.5-25 MG PO CAPS: 1.0000 | ORAL_CAPSULE | Freq: Every day | ORAL | 1 refills | Status: DC

## 2021-05-25 NOTE — Progress Notes (Signed)
[facility]  PROGRESS NOTE  Patient:Christopher Harrell Male   DOB:1970-06-04     51 y.o.  ZJQ:[id number]  Visit Date: 05/25/2021  HPI:  51 year old African-American male .  Major complaint is back and leg pain.  Episodic pain over the last couple years but during Thanksgiving while working was doing a lot of lifting and significant pain into both legs worse on the right.  Sometimes feels like it even goes to the heel he did not have any cushion.  Has been through anti-inflammatories had x-rays at ER lumbar disc disease.  Wrote mild spinal listhesis L1 -2..  Has not been able to go back to work Feels like right leg sometimes gives way      Past Medical History:  Diagnosis Date   Community acquired pneumonia    a. Dx 01/21/2016 - LLL PNA.   ED (erectile dysfunction)    Essential hypertension    Hyperlipidemia    Tobacco abuse     Past Surgical History:  Procedure Laterality Date   COLONOSCOPY WITH PROPOFOL N/A 04/05/2021   Procedure: COLONOSCOPY WITH PROPOFOL;  Surgeon: Lucilla Lame, MD;  Location: [facility] ENDOSCOPY;  Service: Endoscopy;  Laterality: N/A;   NO PAST SURGERIES      Social History   Tobacco Use   Smoking status: Every Day    Packs/day: 0.50    Years: 20.00    Pack years: 10.00    Types: Cigarettes   Smokeless tobacco: Never   Tobacco comments:    Tried gum.  Substance Use Topics   Alcohol use: Not Currently     MEDICATIONS: Current Outpatient Medications  Medication Sig Dispense Refill   albuterol (VENTOLIN HFA) 108 (90 Base) MCG/ACT inhaler Inhale 2 puffs into the lungs every 6 (six) hours as needed for wheezing or shortness of breath. 1 each 1   aspirin EC 81 MG tablet Take 1 tablet (81 mg total) by mouth daily. 30 tablet 3   atenolol (TENORMIN) 50 MG tablet Take 1 tablet (50 mg total) by mouth daily. 30 tablet 2   cyclobenzaprine (FLEXERIL) 5 MG tablet Take 1 tablet (5 mg total) by mouth 3 (three) times daily as needed for muscle  spasms. 12 tablet 0   Multiple Vitamin (MULTIVITAMIN WITH MINERALS) TABS tablet Take 1 tablet by mouth daily.     sildenafil (VIAGRA) 100 MG tablet Take 1 tablet (100 mg total) by mouth daily as needed for erectile dysfunction. 15 tablet 11   tamsulosin (FLOMAX) 0.4 MG CAPS capsule Take 1 capsule (0.4 mg total) by mouth daily. 30 capsule 3   amLODipine (NORVASC) 10 MG tablet Take 1 tablet (10 mg total) by mouth daily. (Patient not taking: Reported on 05/25/2021) 30 tablet 1   atorvastatin (LIPITOR) 10 MG tablet Take 1 tablet (10 mg total) by mouth daily. (Patient not taking: Reported on 05/25/2021) 30 tablet 1   naproxen (NAPROSYN) 500 MG tablet Take 1 tablet (500 mg total) by mouth 2 (two) times daily with a meal. (Patient not taking: Reported on 05/25/2021) 30 tablet 0   triamterene-hydrochlorothiazide (DYAZIDE) 37.5-25 MG capsule Take 1 each (1 capsule total) by mouth daily. (Patient not taking: Reported on 05/25/2021) 30 capsule 1   No current facility-administered medications for this visit.     ALLERGIES No Known Allergies   PHYSICAL EXAM: Blood pressure (!) 169/116, pulse 69, temperature (!) 97.5 F (36.4 C), temperature source Oral, resp. rate 16, height 5\' 9"  (1.753 m), weight 220 lb 3.2 oz (  99.9 kg), SpO2 95 %. Pleasant male.  Muscular skill exam performed.  Good range of motion neck and shoulders.  Hands without synovitis.  Back flexes to about foot from the floor.  Decreased lateral bending.  Hips move well.  No knee effusions Neurologic: 1+ knee jerks bilaterally.  2+ ankle jerks bilaterally.  Positive straight leg raising on the right.  Hypoesthesia to pin exam on the medial aspect of the right leg.  Some mild difficulty standing on his toes and his heels   ASSESSMENT: Lumbar disc disease.  Suspect radiculopathy.  Has been through muscle relaxers anti-inflammatories analgesics and gabapentin Hypertension     PLAN: Physical therapy MR.  We will apply for patient assistance.   Probably depending on MRI would need to see orthopedics or physiatry    G. Fayrene Fearing. MD           05/25/2021,  10:03 AM

## 2021-05-25 NOTE — Progress Notes (Signed)
Established Patient Office Visit  Subjective:  Patient ID: Christopher Harrell, male    DOB: 1970-06-08  Age: 51 y.o. MRN: [id number]  CC:  Chief Complaint  Patient presents with   Follow-up   Hypertension    Patient has been out of Amlodipine and Triamterene-HCTZ ~ 2 weeks   Hyperlipidemia    Patient has been out of Atorvastatin ~ 2 weeks.    HPI Christopher Harrell is a 51 y/o male who has history of ED, Hypertension, Hyperlipidemia, Tobacco use presents for routine follow up and medication refill. His blood  pressure was elevated because he was out of his medication for 2 weeks.  He does not check his blood pressure at home, and non compliant with DASH diet. He continues to smoke more than 1/2 pack of cigarette daily and admits the desire to quit. He denies headache, chest pain, palpitation, light headedness and vision changes. She was seen at the Urology clinic for ED on 05/04/21 by McG[place]an PA.C. Overall, he states that he's doing well and offers no further complaint.  Past Medical History:  Diagnosis Date   Community acquired pneumonia    a. Dx 01/21/2016 - LLL PNA.   ED (erectile dysfunction)    Essential hypertension    Hyperlipidemia    Tobacco abuse     Past Surgical History:  Procedure Laterality Date   COLONOSCOPY WITH PROPOFOL N/A 04/05/2021   Procedure: COLONOSCOPY WITH PROPOFOL;  Surgeon: Lucilla Lame, MD;  Location: [facility] ENDOSCOPY;  Service: Endoscopy;  Laterality: N/A;   NO PAST SURGERIES      Family History  Problem Relation Age of Onset   Hypertension Mother    Lung cancer Mother    Hyperlipidemia Mother    Heart attack Father 67   Heart attack Maternal Grandmother     Social History   Socioeconomic History   Marital status: Legally Separated    Spouse name: Not on file   Number of children: Not on file   Years of education: Not on file   Highest education level: Not on file  Occupational History   Occupation: Picture framer    Comment: [employer]   Tobacco Use   Smoking status: Every Day    Packs/day: 0.50    Years: 20.00    Pack years: 10.00    Types: Cigarettes   Smokeless tobacco: Never   Tobacco comments:    Tried gum.  Vaping Use   Vaping Use: Never used  Substance and Sexual Activity   Alcohol use: Not Currently   Drug use: No   Sexual activity: Yes    Birth control/protection: None  Other Topics Concern   Not on file  Social History Narrative   No longer living with wife. Walking for 20 mins per day   Social Determinants of Health   Financial Resource Strain: Not on file  Food Insecurity: No Food Insecurity   Worried About Charity fundraiser in the Last Year: Never true   Arboriculturist in the Last Year: Never true  Transportation Needs: No Transportation Needs   Lack of Transportation (Medical): No   Lack of Transportation (Non-Medical): No  Physical Activity: Not on file  Stress: Not on file  Social Connections: Not on file  Intimate Partner Violence: Not on file    Outpatient Medications Prior to Visit  Medication Sig Dispense Refill   albuterol (VENTOLIN HFA) 108 (90 Base) MCG/ACT inhaler Inhale 2 puffs into the lungs every 6 (six) hours as  needed for wheezing or shortness of breath. 1 each 1   aspirin EC 81 MG tablet Take 1 tablet (81 mg total) by mouth daily. 30 tablet 3   cyclobenzaprine (FLEXERIL) 5 MG tablet Take 1 tablet (5 mg total) by mouth 3 (three) times daily as needed for muscle spasms. 12 tablet 0   Multiple Vitamin (MULTIVITAMIN WITH MINERALS) TABS tablet Take 1 tablet by mouth daily.     sildenafil (VIAGRA) 100 MG tablet Take 1 tablet (100 mg total) by mouth daily as needed for erectile dysfunction. 15 tablet 11   tamsulosin (FLOMAX) 0.4 MG CAPS capsule Take 1 capsule (0.4 mg total) by mouth daily. 30 capsule 3   atenolol (TENORMIN) 50 MG tablet Take 1 tablet (50 mg total) by mouth daily. 30 tablet 2   amLODipine (NORVASC) 10 MG tablet Take 1 tablet (10 mg total) by mouth daily.  (Patient not taking: Reported on 05/25/2021) 30 tablet 1   atorvastatin (LIPITOR) 10 MG tablet Take 1 tablet (10 mg total) by mouth daily. (Patient not taking: Reported on 05/25/2021) 30 tablet 1   naproxen (NAPROSYN) 500 MG tablet Take 1 tablet (500 mg total) by mouth 2 (two) times daily with a meal. (Patient not taking: Reported on 05/25/2021) 30 tablet 0   triamterene-hydrochlorothiazide (DYAZIDE) 37.5-25 MG capsule Take 1 each (1 capsule total) by mouth daily. (Patient not taking: Reported on 05/25/2021) 30 capsule 1   No facility-administered medications prior to visit.    No Known Allergies  ROS Review of Systems  Constitutional: Negative.   Eyes: Negative.   Respiratory: Negative.    Cardiovascular: Negative.   Neurological: Negative.   Psychiatric/Behavioral: Negative.       Objective:    Physical Exam HENT:     Head: Normocephalic and atraumatic.     Mouth/Throat:     Mouth: Mucous membranes are moist.  Eyes:     Extraocular Movements: Extraocular movements intact.     Conjunctiva/sclera: Conjunctivae normal.     Pupils: Pupils are equal, round, and reactive to light.  Cardiovascular:     Rate and Rhythm: Normal rate and regular rhythm.     Pulses: Normal pulses.     Heart sounds: Normal heart sounds.  Pulmonary:     Effort: Pulmonary effort is normal.     Breath sounds: Normal breath sounds.  Neurological:     General: No focal deficit present.     Mental Status: He is alert and oriented to person, place, and time. Mental status is at baseline.  Psychiatric:        Mood and Affect: Mood normal.        Behavior: Behavior normal.        Thought Content: Thought content normal.        Judgment: Judgment normal.    BP (!) 169/116 (BP Location: Left Arm, Patient Position: Sitting, Cuff Size: Large)    Pulse 69    Temp (!) 97.5 F (36.4 C) (Oral)    Resp 16    Ht 5' 9"  (1.753 m)    Wt 220 lb 3.2 oz (99.9 kg)    SpO2 95%    BMI 32.52 kg/m  Wt Readings from Last 3  Encounters:  05/25/21 220 lb 3.2 oz (99.9 kg)  05/25/21 220 lb 3.2 oz (99.9 kg)  05/04/21 219 lb (99.3 kg)   Encouraged weight loss  Health Maintenance Due  Topic Date Due   Hepatitis C Screening  Never done   TETANUS/TDAP  Never done   Zoster Vaccines- Shingrix (1 of 2) Never done   COVID-19 Vaccine (3 - Booster for Pfizer series) 10/18/2019   INFLUENZA VACCINE  11/29/2020   URINE MICROALBUMIN  05/27/2021    There are no preventive care reminders to display for this patient.  Lab Results  Component Value Date   TSH 1.110 06/13/2016   Lab Results  Component Value Date   WBC 6.1 12/09/2020   HGB 16.5 12/09/2020   HCT 47.6 12/09/2020   MCV 87 12/09/2020   PLT 277 12/09/2020   Lab Results  Component Value Date   NA 147 (H) 12/09/2020   K 4.0 12/09/2020   CO2 25 12/09/2020   GLUCOSE 73 12/09/2020   BUN 15 12/09/2020   CREATININE 1.11 12/09/2020   BILITOT 0.9 12/09/2020   ALKPHOS 66 12/09/2020   AST 30 12/09/2020   ALT 25 12/09/2020   PROT 7.3 12/09/2020   ALBUMIN 5.1 (H) 12/09/2020   CALCIUM 9.7 12/09/2020   ANIONGAP 10 01/29/2020   EGFR 80 12/09/2020   Lab Results  Component Value Date   CHOL 169 12/09/2020   Lab Results  Component Value Date   HDL 44 12/09/2020   Lab Results  Component Value Date   LDLCALC 94 12/09/2020   Lab Results  Component Value Date   TRIG 180 (H) 12/09/2020   Lab Results  Component Value Date   CHOLHDL 3.8 12/09/2020   Lab Results  Component Value Date   HGBA1C 6.0 (H) 05/25/2021      Assessment & Plan:    1. Essential hypertension - His blood pressure is not under control, his goal should be less than 140/90, was encouraged to pick up and start taking medication, DASH diet and exercise as tolerated. He was educated on signs and symptoms of Stroke and advised to go to the ED. - amLODipine (NORVASC) 10 MG tablet; Take 1 tablet (10 mg total) by mouth daily.  Dispense: 90 tablet; Refill: 1 - atenolol (TENORMIN) 50 MG  tablet; Take 1 tablet (50 mg total) by mouth daily.  Dispense: 90 tablet; Refill: 1 - triamterene-hydrochlorothiazide (DYAZIDE) 37.5-25 MG capsule; Take 1 each (1 capsule total) by mouth daily.  Dispense: 90 capsule; Refill: 1  2. Hypertriglyceridemia - He will continue on current medication, low fat/cholesterol diet and exercise as tolerated. - atorvastatin (LIPITOR) 10 MG tablet; Take 1 tablet (10 mg total) by mouth daily.  Dispense: 90 tablet; Refill: 1  3. Health care maintenance - Labs will be checked - PSA; Future - Urine Microalbumin w/creat. ratio; Future - HgB A1c; Future - HgB A1c - PSA  4. Tobacco abuse -He was encouraged on smoking cessation, provided with [place] Quit line information and encouraged to call.     Follow-up: Return in about 13 weeks (around 08/24/2021), or if symptoms worsen or fail to improve.    Anjanae Woehrle Jerold Coombe, NP

## 2021-05-25 NOTE — Patient Instructions (Signed)
Heart-Healthy Eating Plan Heart-healthy meal planning includes: Eating less unhealthy fats. Eating more healthy fats. Making other changes in your diet. Talk with your doctor or a diet specialist (dietitian) to create an eating plan that is right for you. What is my plan? Your doctor may recommend an eating plan that includes: Total fat: ______% or less of total calories a day. Saturated fat: ______% or less of total calories a day. Cholesterol: less than _________mg a day. What are tips for following this plan? Cooking Avoid frying your food. Try to bake, boil, grill, or broil it instead. You can also reduce fat by: Removing the skin from poultry. Removing all visible fats from meats. Steaming vegetables in water or broth. Meal planning  At meals, divide your plate into four equal parts: Fill one-half of your plate with vegetables and green salads. Fill one-fourth of your plate with whole grains. Fill one-fourth of your plate with lean protein foods. Eat 4-5 servings of vegetables per day. A serving of vegetables is: 1 cup of raw or cooked vegetables. 2 cups of raw leafy greens. Eat 4-5 servings of fruit per day. A serving of fruit is: 1 medium whole fruit.  cup of dried fruit.  cup of fresh, frozen, or canned fruit.  cup of 100% fruit juice. Eat more foods that have soluble fiber. These are apples, broccoli, carrots, beans, peas, and barley. Try to get 20-30 g of fiber per day. Eat 4-5 servings of nuts, legumes, and seeds per week: 1 serving of dried beans or legumes equals  cup after being cooked. 1 serving of nuts is  cup. 1 serving of seeds equals 1 tablespoon. General information Eat more home-cooked food. Eat less restaurant, buffet, and fast food. Limit or avoid alcohol. Limit foods that are high in starch and sugar. Avoid fried foods. Lose weight if you are overweight. Keep track of how much salt (sodium) you eat. This is important if you have high blood  pressure. Ask your doctor to tell you more about this. Try to add vegetarian meals each week. Fats Choose healthy fats. These include olive oil and canola oil, flaxseeds, walnuts, almonds, and seeds. Eat more omega-3 fats. These include salmon, mackerel, sardines, tuna, flaxseed oil, and ground flaxseeds. Try to eat fish at least 2 times each week. Check food labels. Avoid foods with trans fats or high amounts of saturated fat. Limit saturated fats. These are often found in animal products, such as meats, butter, and cream. These are also found in plant foods, such as palm oil, palm kernel oil, and coconut oil. Avoid foods with partially hydrogenated oils in them. These have trans fats. Examples are stick margarine, some tub margarines, cookies, crackers, and other baked goods. What foods can I eat? Fruits All fresh, canned (in natural juice), or frozen fruits. Vegetables Fresh or frozen vegetables (raw, steamed, roasted, or grilled). Green salads. Grains Most grains. Choose whole wheat and whole grains most of the time. Rice and pasta, including brown rice and pastas made with whole wheat. Meats and other proteins Lean, well-trimmed beef, veal, pork, and lamb. Chicken and [place] without skin. All fish and shellfish. Wild duck, rabbit, pheasant, and venison. Egg whites or low-cholesterol egg substitutes. Dried beans, peas, lentils, and tofu. Seeds and most nuts. Dairy Low-fat or nonfat cheeses, including ricotta and mozzarella. Skim or 1% milk that is liquid, powdered, or evaporated. Buttermilk that is made with low-fat milk. Nonfat or low-fat yogurt. Fats and oils Non-hydrogenated (trans-free) margarines. Vegetable oils, including  soybean, sesame, sunflower, olive, peanut, safflower, corn, canola, and cottonseed. Salad dressings or mayonnaise made with a vegetable oil. Beverages Mineral water. Coffee and tea. Diet carbonated beverages. Sweets and desserts Sherbet, gelatin, and fruit ice.  Small amounts of dark chocolate. Limit all sweets and desserts. Seasonings and condiments All seasonings and condiments. The items listed above may not be a complete list of foods and drinks you can eat. Contact a dietitian for more options. What foods should I avoid? Fruits Canned fruit in heavy syrup. Fruit in cream or butter sauce. Fried fruit. Limit coconut. Vegetables Vegetables cooked in cheese, cream, or butter sauce. Fried vegetables. Grains Breads that are made with saturated or trans fats, oils, or whole milk. Croissants. Sweet rolls. Donuts. High-fat crackers, such as cheese crackers. Meats and other proteins Fatty meats, such as hot dogs, ribs, sausage, bacon, rib-eye roast or steak. High-fat deli meats, such as salami and bologna. Caviar. Domestic duck and goose. Organ meats, such as liver. Dairy Cream, sour cream, cream cheese, and creamed cottage cheese. Whole-milk cheeses. Whole or 2% milk that is liquid, evaporated, or condensed. Whole buttermilk. Cream sauce or high-fat cheese sauce. Yogurt that is made from whole milk. Fats and oils Meat fat, or shortening. Cocoa butter, hydrogenated oils, palm oil, coconut oil, palm kernel oil. Solid fats and shortenings, including bacon fat, salt pork, lard, and butter. Nondairy cream substitutes. Salad dressings with cheese or sour cream. Beverages Regular sodas and juice drinks with added sugar. Sweets and desserts Frosting. Pudding. Cookies. Cakes. Pies. Milk chocolate or white chocolate. Buttered syrups. Full-fat ice cream or ice cream drinks. The items listed above may not be a complete list of foods and drinks to avoid. Contact a dietitian for more information. Summary Heart-healthy meal planning includes eating less unhealthy fats, eating more healthy fats, and making other changes in your diet. Eat a balanced diet. This includes fruits and vegetables, low-fat or nonfat dairy, lean protein, nuts and legumes, whole grains, and  heart-healthy oils and fats. This information is not intended to replace advice given to you by your health care provider. Make sure you discuss any questions you have with your health care provider. Document Revised: 08/26/2020 Document Reviewed: 08/26/2020 Elsevier Patient Education  2022 [place] Eating Plan DASH stands for Dietary Approaches to Stop Hypertension. The DASH eating plan is a healthy eating plan that has been shown to: Reduce high blood pressure (hypertension). Reduce your risk for type 2 diabetes, heart disease, and stroke. Help with weight loss. What are tips for following this plan? Reading food labels Check food labels for the amount of salt (sodium) per serving. Choose foods with less than 5 percent of the Daily Value of sodium. Generally, foods with less than 300 milligrams (mg) of sodium per serving fit into this eating plan. To find whole grains, look for the word "whole" as the first word in the ingredient list. Shopping Buy products labeled as "low-sodium" or "no salt added." Buy fresh foods. Avoid canned foods and pre-made or frozen meals. Cooking Avoid adding salt when cooking. Use salt-free seasonings or herbs instead of table salt or sea salt. Check with your health care provider or pharmacist before using salt substitutes. Do not fry foods. Cook foods using healthy methods such as baking, boiling, grilling, roasting, and broiling instead. Cook with heart-healthy oils, such as olive, canola, avocado, soybean, or sunflower oil. Meal planning  Eat a balanced diet that includes: 4 or more servings of fruits and 4 or more  servings of vegetables each day. Try to fill one-half of your plate with fruits and vegetables. 6-8 servings of whole grains each day. Less than 6 oz (170 g) of lean meat, poultry, or fish each day. A 3-oz (85-g) serving of meat is about the same size as a deck of cards. One egg equals 1 oz (28 g). 2-3 servings of low-fat dairy each  day. One serving is 1 cup (237 mL). 1 serving of nuts, seeds, or beans 5 times each week. 2-3 servings of heart-healthy fats. Healthy fats called omega-3 fatty acids are found in foods such as walnuts, flaxseeds, fortified milks, and eggs. These fats are also found in cold-water fish, such as sardines, salmon, and mackerel. Limit how much you eat of: Canned or prepackaged foods. Food that is high in trans fat, such as some fried foods. Food that is high in saturated fat, such as fatty meat. Desserts and other sweets, sugary drinks, and other foods with added sugar. Full-fat dairy products. Do not salt foods before eating. Do not eat more than 4 egg yolks a week. Try to eat at least 2 vegetarian meals a week. Eat more home-cooked food and less restaurant, buffet, and fast food. Lifestyle When eating at a restaurant, ask that your food be prepared with less salt or no salt, if possible. If you drink alcohol: Limit how much you use to: 0-1 drink a day for women who are not pregnant. 0-2 drinks a day for men. Be aware of how much alcohol is in your drink. In the [place] one drink equals one 12 oz bottle of beer (355 mL), one 5 oz glass of wine (148 mL), or one 1 oz glass of hard liquor (44 mL). General information Avoid eating more than 2,300 mg of salt a day. If you have hypertension, you may need to reduce your sodium intake to 1,500 mg a day. Work with your health care provider to maintain a healthy body weight or to lose weight. Ask what an ideal weight is for you. Get at least 30 minutes of exercise that causes your heart to beat faster (aerobic exercise) most days of the week. Activities may include walking, swimming, or biking. Work with your health care provider or dietitian to adjust your eating plan to your individual calorie needs. What foods should I eat? Fruits All fresh, dried, or frozen fruit. Canned fruit in natural juice (without added sugar). Vegetables Fresh or frozen  vegetables (raw, steamed, roasted, or grilled). Low-sodium or reduced-sodium tomato and vegetable juice. Low-sodium or reduced-sodium tomato sauce and tomato paste. Low-sodium or reduced-sodium canned vegetables. Grains Whole-grain or whole-wheat bread. Whole-grain or whole-wheat pasta. Brown rice. [place]. Bulgur. Whole-grain and low-sodium cereals. Pita bread. Low-fat, low-sodium crackers. Whole-wheat flour tortillas. Meats and other proteins Skinless chicken or [place]. Ground chicken or [place]. Pork with fat trimmed off. Fish and seafood. Egg whites. Dried beans, peas, or lentils. Unsalted nuts, nut butters, and seeds. Unsalted canned beans. Lean cuts of beef with fat trimmed off. Low-sodium, lean precooked or cured meat, such as sausages or meat loaves. Dairy Low-fat (1%) or fat-free (skim) milk. Reduced-fat, low-fat, or fat-free cheeses. Nonfat, low-sodium ricotta or cottage cheese. Low-fat or nonfat yogurt. Low-fat, low-sodium cheese. Fats and oils Soft margarine without trans fats. Vegetable oil. Reduced-fat, low-fat, or light mayonnaise and salad dressings (reduced-sodium). Canola, safflower, olive, avocado, soybean, and sunflower oils. Avocado. Seasonings and condiments Herbs. Spices. Seasoning mixes without salt. Other foods Unsalted popcorn and pretzels. Fat-free sweets. The items  listed above may not be a complete list of foods and beverages you can eat. Contact a dietitian for more information. What foods should I avoid? Fruits Canned fruit in a light or heavy syrup. Fried fruit. Fruit in cream or butter sauce. Vegetables Creamed or fried vegetables. Vegetables in a cheese sauce. Regular canned vegetables (not low-sodium or reduced-sodium). Regular canned tomato sauce and paste (not low-sodium or reduced-sodium). Regular tomato and vegetable juice (not low-sodium or reduced-sodium). Angie Fava. Olives. Grains Baked goods made with fat, such as croissants, muffins, or some  breads. Dry pasta or rice meal packs. Meats and other proteins Fatty cuts of meat. Ribs. Fried meat. Berniece Salines. Bologna, salami, and other precooked or cured meats, such as sausages or meat loaves. Fat from the back of a pig (fatback). Bratwurst. Salted nuts and seeds. Canned beans with added salt. Canned or smoked fish. Whole eggs or egg yolks. Chicken or [place] with skin. Dairy Whole or 2% milk, cream, and half-and-half. Whole or full-fat cream cheese. Whole-fat or sweetened yogurt. Full-fat cheese. Nondairy creamers. Whipped toppings. Processed cheese and cheese spreads. Fats and oils Butter. Stick margarine. Lard. Shortening. Ghee. Bacon fat. Tropical oils, such as coconut, palm kernel, or palm oil. Seasonings and condiments Onion salt, garlic salt, seasoned salt, table salt, and sea salt. Worcestershire sauce. Tartar sauce. Barbecue sauce. Teriyaki sauce. Soy sauce, including reduced-sodium. Steak sauce. Canned and packaged gravies. Fish sauce. Oyster sauce. Cocktail sauce. Store-bought horseradish. Ketchup. Mustard. Meat flavorings and tenderizers. Bouillon cubes. Hot sauces. Pre-made or packaged marinades. Pre-made or packaged taco seasonings. Relishes. Regular salad dressings. Other foods Salted popcorn and pretzels. The items listed above may not be a complete list of foods and beverages you should avoid. Contact a dietitian for more information. Where to find more information National Heart, Lung, and Blood Institute: https://wilson-eaton.com/ American Heart Association: www.heart.org Academy of Nutrition and Dietetics: www.eatright.[place]: www.kidney.org Summary The DASH eating plan is a healthy eating plan that has been shown to reduce high blood pressure (hypertension). It may also reduce your risk for type 2 diabetes, heart disease, and stroke. When on the DASH eating plan, aim to eat more fresh fruits and vegetables, whole grains, lean proteins, low-fat dairy, and  heart-healthy fats. With the DASH eating plan, you should limit salt (sodium) intake to 2,300 mg a day. If you have hypertension, you may need to reduce your sodium intake to 1,500 mg a day. Work with your health care provider or dietitian to adjust your eating plan to your individual calorie needs. This information is not intended to replace advice given to you by your health care provider. Make sure you discuss any questions you have with your health care provider. Document Revised: 03/21/2019 Document Reviewed: 03/21/2019 Elsevier Patient Education  2022 [employer].

## 2021-05-26 ENCOUNTER — Ambulatory Visit: Payer: Self-pay

## 2021-05-26 ENCOUNTER — Ambulatory Visit: Payer: Self-pay | Admitting: Gerontology

## 2021-05-26 LAB — HEMOGLOBIN A1C
Est. average glucose Bld gHb Est-mCnc: 126 mg/dL
Hgb A1c MFr Bld: 6 % — ABNORMAL HIGH (ref 4.8–5.6)

## 2021-05-26 LAB — PSA: Prostate Specific Ag, Serum: 1.3 ng/mL (ref 0.0–4.0)

## 2021-05-31 ENCOUNTER — Emergency Department: Payer: Self-pay

## 2021-05-31 ENCOUNTER — Other Ambulatory Visit: Payer: Self-pay

## 2021-05-31 ENCOUNTER — Emergency Department
Admission: EM | Admit: 2021-05-31 | Discharge: 2021-05-31 | Disposition: A | Discharge status: Home / Self Care | Payer: Self-pay | Attending: Emergency Medicine | Admitting: Emergency Medicine

## 2021-05-31 DIAGNOSIS — F17200 Nicotine dependence, unspecified, uncomplicated: Secondary | ICD-10-CM | POA: Insufficient documentation

## 2021-05-31 DIAGNOSIS — R0789 Other chest pain: Secondary | ICD-10-CM | POA: Insufficient documentation

## 2021-05-31 DIAGNOSIS — I10 Essential (primary) hypertension: Secondary | ICD-10-CM | POA: Insufficient documentation

## 2021-05-31 DIAGNOSIS — J449 Chronic obstructive pulmonary disease, unspecified: Secondary | ICD-10-CM | POA: Insufficient documentation

## 2021-05-31 DIAGNOSIS — R079 Chest pain, unspecified: Secondary | ICD-10-CM

## 2021-05-31 DIAGNOSIS — R1030 Lower abdominal pain, unspecified: Secondary | ICD-10-CM | POA: Insufficient documentation

## 2021-05-31 LAB — BASIC METABOLIC PANEL
Anion gap: 8 (ref 5–15)
BUN: 16 mg/dL (ref 6–20)
CO2: 23 mmol/L (ref 22–32)
Calcium: 8.9 mg/dL (ref 8.9–10.3)
Chloride: 104 mmol/L (ref 98–111)
Creatinine, Ser: 0.85 mg/dL (ref 0.61–1.24)
GFR, Estimated: >60 mL/min (ref >60)
Glucose, Bld: 132 mg/dL — ABNORMAL HIGH (ref 70–99)
Potassium: 3.9 mmol/L (ref 3.5–5.1)
Sodium: 135 mmol/L (ref 135–145)

## 2021-05-31 LAB — HEPATIC FUNCTION PANEL
ALT: 27 U/L (ref 0–44)
AST: 21 U/L (ref 15–41)
Albumin: 4.2 g/dL (ref 3.5–5.0)
Alkaline Phosphatase: 55 U/L (ref 38–126)
Bilirubin, Direct: 0.1 mg/dL (ref 0.0–0.2)
Indirect Bilirubin: 1 mg/dL — ABNORMAL HIGH (ref 0.3–0.9)
Total Bilirubin: 1.1 mg/dL (ref 0.3–1.2)
Total Protein: 7.4 g/dL (ref 6.5–8.1)

## 2021-05-31 LAB — CBC
HCT: 49.1 % (ref 39.0–52.0)
Hemoglobin: 17 g/dL (ref 13.0–17.0)
MCH: 30.5 pg (ref 26.0–34.0)
MCHC: 34.6 g/dL (ref 30.0–36.0)
MCV: 88.2 fL (ref 80.0–100.0)
Platelets: 250 10*3/uL (ref 150–400)
RBC: 5.57 MIL/uL (ref 4.22–5.81)
RDW: 13.7 % (ref 11.5–15.5)
WBC: 4.9 10*3/uL (ref 4.0–10.5)
nRBC: 0 % (ref 0.0–0.2)

## 2021-05-31 LAB — TROPONIN I (HIGH SENSITIVITY)
Troponin I (High Sensitivity): 12 ng/L (ref <18)
Troponin I (High Sensitivity): 11 ng/L (ref <18)

## 2021-05-31 LAB — LIPASE, BLOOD: Lipase: 36 U/L (ref 11–51)

## 2021-05-31 MED ORDER — PANTOPRAZOLE SODIUM 20 MG PO TBEC
20.0000 mg | DELAYED_RELEASE_TABLET | Freq: Every day | ORAL | 0 refills | Status: DC
Start: 2021-05-31 — End: 2022-01-24

## 2021-05-31 MED ORDER — FAMOTIDINE 20 MG PO TABS
20.0000 mg | ORAL_TABLET | Freq: Once | ORAL | Status: AC
  Administered 2021-05-31: 20 mg via ORAL
  Filled 2021-05-31: qty 1

## 2021-05-31 MED ORDER — ALUM & MAG HYDROXIDE-SIMETH 200-200-20 MG/5ML PO SUSP
30.0000 mL | Freq: Once | ORAL | Status: AC
  Administered 2021-05-31: 30 mL via ORAL
  Filled 2021-05-31: qty 30

## 2021-05-31 NOTE — Progress Notes (Deleted)
06/01/2021 9:04 PM   Christopher Harrell [id number] [id number]  Referring provider: Langston Reusing, NP [address] [employer] [place],  [place] [address]  No chief complaint on file.  Urological history: 1. ED -contributing factors of age, HTN, HLD, diabetes and smoking -testosterone level 306 03/2021 -managed with sildenafil 100 mg, on-demand-dosing  2. BPH -PSA 1.3  05/25/2021 -I PSS *** -PVR *** -managed with tamsulosin 0.4 mg daily   HPI: Christopher Harrell is a 51 y.o. male who I saw on 05/04/2021 in follow up after a trial of sildenafil 100 mg, on-demand-dosing for ED who also was having issues with nocturia x 5.    We discussed his risk factors for nocturia such as possible obstructive sleep apnea, hypertension, COPD and BPH.    I started him on tamsulosin and scheduled him for a one month follow up.       Score:  1-7 Mild 8-19 Moderate 20-35 Severe      PMH: Past Medical History:  Diagnosis Date   Community acquired pneumonia    a. Dx 01/21/2016 - [facility] PNA.   ED (erectile dysfunction)    Essential hypertension    Hyperlipidemia    Tobacco abuse     Surgical History: Past Surgical History:  Procedure Laterality Date   COLONOSCOPY WITH PROPOFOL N/A 04/05/2021   Procedure: COLONOSCOPY WITH PROPOFOL;  Surgeon: Lucilla Lame, MD;  Location[facility] ENDOSCOPY;  Service: Endoscopy;  Laterality: N/A;   NO PAST SURGERIES      Home Medications:  Allergies as of 06/01/2021   No Known Allergies      Medication List        Accurate as of May 31, 2021  9:04 PM. If you have any questions, ask your nurse or doctor.          albuterol 108 (90 Base) MCG/ACT inhaler Commonly known as: VENTOLIN HFA Inhale 2 puffs into the lungs every 6 (six) hours as needed for wheezing or shortness of breath.   amLODipine 10 MG tablet Commonly known as: NORVASC Take 1 tablet (10 mg total) by mouth daily.   aspirin EC 81 MG tablet Take 1 tablet (81 mg total) by mouth  daily.   atenolol 50 MG tablet Commonly known as: Tenormin Take 1 tablet (50 mg total) by mouth daily.   atorvastatin 10 MG tablet Commonly known as: LIPITOR Take 1 tablet (10 mg total) by mouth daily.   cyclobenzaprine 5 MG tablet Commonly known as: FLEXERIL Take 1 tablet (5 mg total) by mouth 3 (three) times daily as needed for muscle spasms.   multivitamin with minerals Tabs tablet Take 1 tablet by mouth daily.   pantoprazole 20 MG tablet Commonly known as: Protonix Take 1 tablet (20 mg total) by mouth daily.   sildenafil 100 MG tablet Commonly known as: VIAGRA Take 1 tablet (100 mg total) by mouth daily as needed for erectile dysfunction.   tamsulosin 0.4 MG Caps capsule Commonly known as: FLOMAX Take 1 capsule (0.4 mg total) by mouth daily.   triamterene-hydrochlorothiazide 37.5-25 MG capsule Commonly known as: DYAZIDE Take 1 each (1 capsule total) by mouth daily.        Allergies: No Known Allergies  Family History: Family History  Problem Relation Age of Onset   Hypertension Mother    Lung cancer Mother    Hyperlipidemia Mother    Heart attack Father 64   Heart attack Maternal Grandmother     Social History:  reports that he has been  smoking cigarettes. He has a 10.00 pack-year smoking history. He has never used smokeless tobacco. He reports that he does not currently use alcohol. He reports that he does not use drugs.  ROS: Pertinent ROS in HPI  Physical Exam: There were no vitals taken for this visit.  Constitutional:  Well nourished. Alert and oriented, No acute distress. HEENT: [place] AT, moist mucus membranes.  Trachea midline Cardiovascular: No clubbing, cyanosis, or edema. Respiratory: Normal respiratory effort, no increased work of breathing. GU: No CVA tenderness.  No bladder fullness or masses.  Patient with circumcised/uncircumcised phallus. ***Foreskin easily retracted***  Urethral meatus is patent.  No penile discharge. No penile lesions or  rashes. Scrotum without lesions, cysts, rashes and/or edema.  Testicles are located scrotally bilaterally. No masses are appreciated in the testicles. Left and right epididymis are normal. Rectal: Patient with  normal sphincter tone. Anus and perineum without scarring or rashes. No rectal masses are appreciated. Prostate is approximately *** grams, *** nodules are appreciated. Seminal vesicles are normal. Neurologic: Grossly intact, no focal deficits, moving all 4 extremities. Psychiatric: Normal mood and affect.   Laboratory Data: Lab Results  Component Value Date   WBC 4.9 05/31/2021   HGB 17.0 05/31/2021   HCT 49.1 05/31/2021   MCV 88.2 05/31/2021   PLT 250 05/31/2021    Lab Results  Component Value Date   CREATININE 0.85 05/31/2021    Lab Results  Component Value Date   TESTOSTERONE 306 04/28/2021    Lab Results  Component Value Date   HGBA1C 6.0 (H) 05/25/2021       Component Value Date/Time   CHOL 169 12/09/2020 1639   HDL 44 12/09/2020 1639   CHOLHDL 3.8 12/09/2020 1639   LDLCALC 94 12/09/2020 1639    Lab Results  Component Value Date   AST 21 05/31/2021   Lab Results  Component Value Date   ALT 27 05/31/2021  I have reviewed the labs.   Pertinent Imaging: ***  Assessment & Plan:    1. Erectile dysfunction -At goal with sildenafil 100 mg -Continue sildenafil 100 mg on demand dosing  2. Nocturia ***  No follow-ups on file.  These notes generated with voice recognition software. I apologize for typographical errors.  Zara Council, PA-C  [facility] [address]  [place], [place] [address] [phone]

## 2021-05-31 NOTE — ED Provider Notes (Signed)
[facility] Provider Note    Event Date/Time   First MD Initiated Contact with Patient 05/31/21 [phone]     (approximate)   History   Chest Pain and Back Pain   HPI  Christopher Harrell is a 51 y.o. male with past medical history of hypertension hyperlipidemia presents with chest pain.  Symptoms started about 3 days ago.  Patient describes sensation of bubbling from the lower abdomen up to the chest with resulting chest pressure.  No radiation of pain.  Has been intermittent but relatively constant this morning.  Generally worse with wakening.  He endorses some mild shortness of breath denies nausea or diaphoresis.  Pain is not exertional or pleuritic.  Does have history of acid reflux.  Denies pain worse after eating or other generalized abdominal pain.  Patient denies any cardiac history.    Past Medical History:  Diagnosis Date   Community acquired pneumonia    a. Dx 01/21/2016 - LLL PNA.   ED (erectile dysfunction)    Essential hypertension    Hyperlipidemia    Tobacco abuse     Patient Active Problem List   Diagnosis Date Noted   Colon cancer screening    Polyp of descending colon    Vitamin D deficiency 12/09/2020   Chronic midline low back pain with bilateral sciatica 06/10/2020   Back pain 05/13/2020   Essential hypertension 08/13/2019   Erectile dysfunction 06/25/2019   Acute flank pain 06/25/2019   Tooth abscess 04/30/2019   Smoking 04/30/2019   Hypertriglyceridemia 06/05/2017   COPD with acute exacerbation ([place]) 10/14/2016   Pneumonia 10/14/2016   Hypertensive urgency, malignant 06/13/2016   Tobacco abuse 06/13/2016   Community acquired pneumonia 01/23/2016   Elevated troponin 01/23/2016   HTN (hypertension) 01/23/2016   Chest pain 01/23/2016     Physical Exam  Triage Vital Signs: ED Triage Vitals [05/31/21 0930]  Enc Vitals Group     BP (!) 151/115     Pulse Rate 66     Resp 18     Temp 98 F (36.7 C)     Temp Source Oral      SpO2 99 %     Weight      Height      Head Circumference      Peak Flow      Pain Score      Pain Loc      Pain Edu?      Excl. in [place]?     Most recent vital signs: Vitals:   05/31/21 1233 05/31/21 1236  BP: (!) 141/110 (!) 143/88  Pulse: 77 74  Resp: 16 18  Temp: 98.3 F (36.8 C)   SpO2: 99% 98%     General: Awake, no distress.  CV:  Good peripheral perfusion.  Resp:  Normal effort.  Abd:  No distention.  Neuro:             Awake, Alert, Oriented x 3  Other:     ED Results / Procedures / Treatments  Labs (all labs ordered are listed, but only abnormal results are displayed) Labs Reviewed  BASIC METABOLIC PANEL - Abnormal; Notable for the following components:      Result Value   Glucose, Bld 132 (*)    All other components within normal limits  HEPATIC FUNCTION PANEL - Abnormal; Notable for the following components:   Indirect Bilirubin 1.0 (*)    All other components within normal limits  CBC  LIPASE, BLOOD  TROPONIN I (HIGH SENSITIVITY)  TROPONIN I (HIGH SENSITIVITY)     EKG  ED interpreted by myself, normal sinus rhythm, normal axis, similar in appearance to prior EKG 2021   RADIOLOGY I reviewed the CXR which does not show any acute cardiopulmonary process; agree with radiology report     PROCEDURES:  Critical Care performed: No   MEDICATIONS ORDERED IN ED: Medications  alum & mag hydroxide-simeth (MAALOX/MYLANTA) 200-200-20 MG/5ML suspension 30 mL (30 mLs Oral Given 05/31/21 1039)  famotidine (PEPCID) tablet 20 mg (20 mg Oral Given 05/31/21 1038)     IMPRESSION / MDM / ASSESSMENT AND PLAN / ED COURSE  I reviewed the triage vital signs and the nursing notes.                              Differential diagnosis includes, but is not limited to, ACS, PE, dissection, reflux    Patient is a 51 year old male who presents with chest pain.  Symptoms started several days ago and described as a bubbling sensation originating in the lower abdomen  and up to the chest.  He has no associated shortness of breath nausea or diaphoresis.  Pain does not radiate.  Patient complaining of pain at the time of my evaluation.  Vital signs notable for mild hypertension but otherwise within normal limits.  I reviewed his EKG which is any acute ischemic changes.  Patient's troponin x2 are negative.  Patient was given Pepcid and GI cocktail and afterward felt much improved and pain is now gone.  We discussed that this is most likely GI but cannot completely rule out cardiac etiology.  Overall with his reassuring work-up including negative troponins and no EKG changes I think he is appropriate for outpatient management.  We will start on Protonix.   FINAL CLINICAL IMPRESSION(S) / ED DIAGNOSES   Final diagnoses:  Chest pain, unspecified type     Rx / DC Orders   ED Discharge Orders          Ordered    pantoprazole (PROTONIX) 20 MG tablet  Daily        05/31/21 1243             Note:  This document was prepared using Dragon voice recognition software and may include unintentional dictation errors.   Rada Hay, MD 05/31/21 1245

## 2021-05-31 NOTE — ED Triage Notes (Signed)
Pt c/o lower back spasm and having pressure "like bubbles in my chest" states he has taken pepto with no relief, pt is in NAD on arrival , ambulatory with a steady gait.

## 2021-05-31 NOTE — Discharge Instructions (Addendum)
Your EKG and cardiac enzymes were all reassuring.  Your chest pain is likely related to acid reflux.  You can start taking the Protonix daily.  If your symptoms return or not improving or you develop any new symptoms that are concerning to you, I please return to the emergency department.
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
## 2021-06-01 ENCOUNTER — Ambulatory Visit: Payer: Self-pay | Admitting: Urology

## 2021-06-03 ENCOUNTER — Encounter: Payer: Self-pay | Admitting: Urology

## 2021-06-06 ENCOUNTER — Telehealth: Payer: Self-pay | Admitting: Pharmacy Technician

## 2021-06-06 NOTE — Telephone Encounter (Signed)
Patient failed to provide 2023 proof of income.  No additional medication assistance will be provided by [facility] without the required proof of income documentation.  Patient notified by letter.  [facility]

## 2021-06-07 ENCOUNTER — Telehealth: Payer: Self-pay | Admitting: Pharmacy Technician

## 2021-06-07 NOTE — Telephone Encounter (Signed)
°  [place], [place]  [address]  June 03, 2021    Dear Percell Miller:  This is to inform you that you are no longer eligible to receive medication assistance at [facility].  The reason(s) are:    _____Your total gross monthly household income exceeds 300% of the Federal Poverty Level.   _____Tangible assets (savings, checking, stocks/bonds, pension, retirement, etc.) exceeds our limit  _____You are eligible to receive benefits from [facility], [facility] or HIV Medication              Assistance Program _____You are eligible to receive benefits from a Medicare Part D plan _____You have prescription insurance  _____You are not an [facility] resident __X__Failure to provide all requested documentation (proof of income for 2023, and/or Patient Intake Application, DOH Attestation, Contract, etc).    Medication assistance will resume once all requested documentation has been returned to our clinic.  If you have questions, please contact our clinic at [phone].    Thank you,  [facility]

## 2021-06-14 ENCOUNTER — Other Ambulatory Visit: Payer: Self-pay

## 2021-07-20 ENCOUNTER — Other Ambulatory Visit: Payer: Self-pay | Admitting: Obstetrics and Gynecology

## 2021-07-20 DIAGNOSIS — G8929 Other chronic pain: Secondary | ICD-10-CM

## 2021-08-24 ENCOUNTER — Ambulatory Visit: Payer: Self-pay | Admitting: Gerontology

## 2021-08-24 ENCOUNTER — Encounter: Payer: Self-pay | Admitting: Gerontology

## 2021-08-24 VITALS — BP 143/78 | HR 69 | Temp 97.8°F | Ht 69.0 in | Wt 219.8 lb

## 2021-08-24 DIAGNOSIS — G8929 Other chronic pain: Secondary | ICD-10-CM

## 2021-08-24 DIAGNOSIS — F17200 Nicotine dependence, unspecified, uncomplicated: Secondary | ICD-10-CM

## 2021-08-24 DIAGNOSIS — Z0000 Encounter for general adult medical examination without abnormal findings: Secondary | ICD-10-CM

## 2021-08-24 DIAGNOSIS — I10 Essential (primary) hypertension: Secondary | ICD-10-CM

## 2021-08-24 MED ORDER — MELOXICAM 7.5 MG PO TABS: 7.5000 mg | ORAL_TABLET | Freq: Every day | ORAL | 0 refills | Status: DC

## 2021-08-24 NOTE — Patient Instructions (Signed)
Smoking Tobacco Information, Adult ?Smoking tobacco can be harmful to your health. Tobacco contains a toxic colorless chemical called nicotine. Nicotine causes changes in your brain that make you want more and more. This is called addiction. This can make it hard to stop smoking once you start. Tobacco also has other toxic chemicals that can hurt your body and raise your risk of many cancers. ?Menthol or "lite" tobacco or cigarette brands are not safer than regular brands. ?How can smoking tobacco affect me? ?Smoking tobacco puts you at risk for: ?Cancer. Smoking is most commonly associated with lung cancer, but can also lead to cancer in other parts of the body. ?Chronic obstructive pulmonary disease (COPD). This is a long-term lung condition that makes it hard to breathe. It also gets worse over time. ?High blood pressure (hypertension), heart disease, stroke, heart attack, and lung infections, such as pneumonia. ?Cataracts. This is when the lenses in the eyes become clouded. ?Digestive problems. This may include peptic ulcers, heartburn, and gastroesophageal reflux disease (GERD). ?Oral health problems, such as gum disease, mouth sores, and tooth loss. ?Loss of taste and smell. ?Smoking also affects how you look and smell. Smoking may cause: ?Wrinkles. ?Yellow or stained teeth, fingers, and fingernails. ?Bad breath. ?Bad-smelling clothes and hair. ?Smoking tobacco can also affect your social life, because: ?It may be challenging to find places to smoke when away from home. Many workplaces, [employer], hotels, and public places are tobacco-free. ?Smoking is expensive. This is due to the cost of tobacco and the long-term costs of treating health problems from smoking. ?Secondhand smoke may affect those around you. Secondhand smoke can cause lung cancer, breathing problems, and heart disease. Children of smokers have a higher risk for: ?Sudden infant death syndrome (SIDS). ?Ear infections. ?Lung infections. ?What  actions can I take to prevent health problems? ?Quit smoking ? ?Do not start smoking. Quit if you already smoke. ?Do not replace cigarette smoking with vaping devices, such as e-cigarettes. ?Make a plan to quit smoking and commit to it. Look for programs to help you, and ask your health care provider for recommendations and ideas. Set a date and write down all the reasons you want to quit. ?Let your friends and family know you are quitting so they can help and support you. Consider finding friends who also want to quit. It can be easier to quit with someone else, so that you can support each other. ?Talk with your health care provider about using nicotine replacement medicines to help you quit. These include gum, lozenges, patches, sprays, or pills. ?If you try to quit but return to smoking, stay positive. It is common to slip up when you first quit, so take it one day at a time. ?Be prepared for cravings. When you feel the urge to smoke, chew gum or suck on hard candy. ?Lifestyle ?Stay busy. ?Take care of your body. Get plenty of exercise, eat a healthy diet, and drink plenty of water. ?Find ways to manage your stress, such as meditation, yoga, exercise, or time spent with friends and family. ?Ask your health care provider about having regular tests (screenings) to check for cancer. This may include blood tests, imaging tests, and other tests. ?Where to find support ?To get support to quit smoking, consider: ?Asking your health care provider for more information and resources. ?Joining a support group for people who want to quit smoking in your local community. There are many effective programs that may help you to quit. ?Calling the smokefree.gov counselor  helpline at 1-800-QUIT-NOW 604 365 2874). ?Where to find more information ?You may find more information about quitting smoking from: ?Centers for Disease Control and Prevention: https://www.chang-huffman.com/ ?https://hall.com/: smokefree.gov ?American Lung Association:  freedomfromsmoking.org ?Contact a health care provider if: ?You have problems breathing. ?Your lips, nose, or fingers turn blue. ?You have chest pain. ?You are coughing up blood. ?You feel like you will faint. ?You have other health changes that cause you to worry. ?Summary ?Smoking tobacco can negatively affect your health, the health of those around you, your finances, and your social life. ?Do not start smoking. Quit if you already smoke. If you need help quitting, ask your health care provider. ?Consider joining a support group for people in your local community who want to quit smoking. There are many effective programs that may help you to quit. ?This information is not intended to replace advice given to you by your health care provider. Make sure you discuss any questions you have with your health care provider. ?Document Revised: 04/12/2021 Document Reviewed: 04/12/2021 ?Elsevier Patient Education ? [place] Eating Plan ?DASH stands for Dietary Approaches to Stop Hypertension. The DASH eating plan is a healthy eating plan that has been shown to: ?Reduce high blood pressure (hypertension). ?Reduce your risk for type 2 diabetes, heart disease, and stroke. ?Help with weight loss. ?What are tips for following this plan? ?Reading food labels ?Check food labels for the amount of salt (sodium) per serving. Choose foods with less than 5 percent of the Daily Value of sodium. Generally, foods with less than 300 milligrams (mg) of sodium per serving fit into this eating plan. ?To find whole grains, look for the word "whole" as the first word in the ingredient list. ?Shopping ?Buy products labeled as "low-sodium" or "no salt added." ?Buy fresh foods. Avoid canned foods and pre-made or frozen meals. ?Cooking ?Avoid adding salt when cooking. Use salt-free seasonings or herbs instead of table salt or sea salt. Check with your health care provider or pharmacist before using salt substitutes. ?Do not fry  foods. Cook foods using healthy methods such as baking, boiling, grilling, roasting, and broiling instead. ?Cook with heart-healthy oils, such as olive, canola, avocado, soybean, or sunflower oil. ?Meal planning ? ?Eat a balanced diet that includes: ?4 or more servings of fruits and 4 or more servings of vegetables each day. Try to fill one-half of your plate with fruits and vegetables. ?6-8 servings of whole grains each day. ?Less than 6 oz (170 g) of lean meat, poultry, or fish each day. A 3-oz (85-g) serving of meat is about the same size as a deck of cards. One egg equals 1 oz (28 g). ?2-3 servings of low-fat dairy each day. One serving is 1 cup (237 mL). ?1 serving of nuts, seeds, or beans 5 times each week. ?2-3 servings of heart-healthy fats. Healthy fats called omega-3 fatty acids are found in foods such as walnuts, flaxseeds, fortified milks, and eggs. These fats are also found in cold-water fish, such as sardines, salmon, and mackerel. ?Limit how much you eat of: ?Canned or prepackaged foods. ?Food that is high in trans fat, such as some fried foods. ?Food that is high in saturated fat, such as fatty meat. ?Desserts and other sweets, sugary drinks, and other foods with added sugar. ?Full-fat dairy products. ?Do not salt foods before eating. ?Do not eat more than 4 egg yolks a week. ?Try to eat at least 2 vegetarian meals a week. ?Eat more home-cooked food and less restaurant,  buffet, and fast food. ?Lifestyle ?When eating at a restaurant, ask that your food be prepared with less salt or no salt, if possible. ?If you drink alcohol: ?Limit how much you use to: ?0-1 drink a day for women who are not pregnant. ?0-2 drinks a day for men. ?Be aware of how much alcohol is in your drink. In the [place] one drink equals one 12 oz bottle of beer (355 mL), one 5 oz glass of wine (148 mL), or one 1? oz glass of hard liquor (44 mL). ?General information ?Avoid eating more than 2,300 mg of salt a day. If you have  hypertension, you may need to reduce your sodium intake to 1,500 mg a day. ?Work with your health care provider to maintain a healthy body weight or to lose weight. Ask what an ideal weight is for you. ?Get at least 30

## 2021-08-24 NOTE — Progress Notes (Signed)
? ?New Patient Office Visit ? ?Subjective   ? ?Patient ID: Christopher Harrell, male    DOB: [birth year]  Age: 51 y.o. MRN: [id number] ? ?CC:  ?Chief Complaint  ?Patient presents with  ? Follow-up  ?  Back pain and pain up/down legs. OTC not helping.  ? ? ?HPI ?Christopher Harrell is a 51 y/o male who has history of ED, Hypertension, Hyperlipidemia, Tobacco use presents for  shooting pain in his bilateral lower extremities  that radiates to his lower back and the pain is at 7-8/10 on a pain scale.  He states that taking Flexeril did not relieve his symptoms.  He had X ray of Lumbar spine done on 03/14/22 and it showed 1. 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 2. Right greater than left facet degenerative changes at L5-S1. He saw Dr Jefm Bryant on 05/25/21 and has not followed up with MRI that was ordered. He denies bladder/bowel incontinence and saddle anesthesia. His blood  today  was 142/78, he states that he is compliant with his medications and  does not check his blood pressure at home, and non compliant with DASH diet. He continues to smoke more than 1/2 pack of cigarette daily and admits the desire to quit but the nicorette gums does not not work for him. He was seen at the Ed for chest pain and was treated for acid reflux and was started on Protonix. He denies headache, chest pain, palpitation, light headedness and vision changes.  Overall, he states that he's doing well and denies  any further complaint. ? ? ?Outpatient Encounter Medications as of 08/24/2021  ?Medication Sig  ? albuterol (VENTOLIN HFA) 108 (90 Base) MCG/ACT inhaler Inhale 2 puffs into the lungs every 6 (six) hours as needed for wheezing or shortness of breath.  ? amLODipine (NORVASC) 10 MG tablet Take 1 tablet (10 mg total) by mouth daily.  ? atenolol (TENORMIN) 50 MG tablet Take 1 tablet (50 mg total) by mouth daily.  ? atorvastatin (LIPITOR) 10 MG tablet Take 1 tablet (10 mg total) by mouth daily.  ? Multiple  Vitamin (MULTIVITAMIN WITH MINERALS) TABS tablet Take 1 tablet by mouth daily.  ? aspirin EC 81 MG tablet Take 1 tablet (81 mg total) by mouth daily. (Patient not taking: Reported on 08/24/2021)  ? cyclobenzaprine (FLEXERIL) 5 MG tablet Take 1 tablet (5 mg total) by mouth 3 (three) times daily as needed for muscle spasms. (Patient not taking: Reported on 08/24/2021)  ? pantoprazole (PROTONIX) 20 MG tablet Take 1 tablet (20 mg total) by mouth daily.  ? sildenafil (VIAGRA) 100 MG tablet Take 1 tablet (100 mg total) by mouth daily as needed for erectile dysfunction.  ? tamsulosin (FLOMAX) 0.4 MG CAPS capsule Take 1 capsule (0.4 mg total) by mouth daily. (Patient not taking: Reported on 08/24/2021)  ? triamterene-hydrochlorothiazide (DYAZIDE) 37.5-25 MG capsule Take 1 each (1 capsule total) by mouth daily.  ? ?No facility-administered encounter medications on file as of 08/24/2021.  ? ? ?Past Medical History:  ?Diagnosis Date  ? Community acquired pneumonia   ? a. Dx 01/21/2016 - LLL PNA.  ? ED (erectile dysfunction)   ? Essential hypertension   ? Hyperlipidemia   ? Tobacco abuse   ? ? ?Past Surgical History:  ?Procedure Laterality Date  ? COLONOSCOPY WITH PROPOFOL N/A 04/05/2021  ? Procedure: COLONOSCOPY WITH PROPOFOL;  Surgeon: Lucilla Lame, MD;  Location: [facility] ENDOSCOPY;  Service: Endoscopy;  Laterality: N/A;  ? NO PAST SURGERIES    ? ? ?  Family History  ?Problem Relation Age of Onset  ? Hypertension Mother   ? Lung cancer Mother   ? Hyperlipidemia Mother   ? Heart attack Father 65  ? Heart attack Maternal Grandmother   ? ? ?Social History  ? ?Socioeconomic History  ? Marital status: Legally Separated  ?  Spouse name: Not on file  ? Number of children: Not on file  ? Years of education: Not on file  ? Highest education level: Not on file  ?Occupational History  ? Occupation: Picture framer  ?  Comment: [employer]  ?Tobacco Use  ? Smoking status: Every Day  ?  Packs/day: 0.50  ?  Years: 20.00  ?  Pack years: 10.00  ?   Types: Cigarettes  ? Smokeless tobacco: Never  ? Tobacco comments:  ?  Tried gum.  ?Vaping Use  ? Vaping Use: Never used  ?Substance and Sexual Activity  ? Alcohol use: Not Currently  ? Drug use: No  ? Sexual activity: Yes  ?  Birth control/protection: None  ?Other Topics Concern  ? Not on file  ?Social History Narrative  ? No longer living with wife. Walking for 20 mins per day  ? ?Social Determinants of Health  ? ?Financial Resource Strain: Not on file  ?Food Insecurity: No Food Insecurity  ? Worried About Charity fundraiser in the Last Year: Never true  ? Ran Out of Food in the Last Year: Never true  ?Transportation Needs: No Transportation Needs  ? Lack of Transportation (Medical): No  ? Lack of Transportation (Non-Medical): No  ?Physical Activity: Not on file  ?Stress: Not on file  ?Social Connections: Not on file  ?Intimate Partner Violence: Not on file  ? ? ?Review of Systems  ?Constitutional: Negative.   ?HENT: Negative.    ?Eyes: Negative.   ?Respiratory: Negative.    ?Cardiovascular: Negative.   ?Gastrointestinal: Negative.   ?Genitourinary: Negative.   ?Musculoskeletal:  Positive for joint pain (Pain in the bilateral LE and joints).  ?Skin: Negative.   ?Neurological: Negative.   ?Psychiatric/Behavioral: Negative.    ? ?  ? ? ?Objective   ? ?BP (!) 143/78 (BP Location: Right Arm, Patient Position: Sitting, Cuff Size: Large)   Pulse 69   Temp 97.8 ?F (36.6 ?C) (Oral)   Ht '5\' 9"'$  (1.753 m)   Wt 219 lb 12.8 oz (99.7 kg)   SpO2 96%   BMI 32.46 kg/m?  ? ?Physical Exam ?Cardiovascular:  ?   Rate and Rhythm: Normal rate and regular rhythm.  ?   Pulses: Normal pulses.  ?   Heart sounds: Normal heart sounds.  ?Abdominal:  ?   General: Bowel sounds are normal.  ?   Palpations: Abdomen is soft.  ?Musculoskeletal:  ?   Cervical back: Normal range of motion.  ?Skin: ?   General: Skin is warm and dry.  ?Neurological:  ?   Mental Status: He is alert and oriented to person, place, and time.  ?Psychiatric:     ?    Mood and Affect: Mood normal.  ? ? ? ?  ? ?Assessment & Plan:  ? ?1. Essential hypertension ?His blood pressure is improving, he will continue on current medication regimen, DASH diet and exercise as tolerated. ? ?2. Smoking ?He was strongly encouraged on smoke cessation  ? ?3. Chronic bilateral low back pain, unspecified whether sciatica present ?He stated that flexeril does not work for him, he will be switched to Meloxicam educated on medication side  effects and advised to notify clinic. He will follow up with Orthopaedics.  He was advised to notify the clinic or go to the ED for worsening symptoms. ?Meloxicam 7.5 mg I po daily for pain. ? ?4. Health care maintenance ?He was encouraged to complete the charity care application and will be referred to Orthopaedics ? ? Follow up with the clinic on 09/28/21 or if symptoms worsen or fail to improve ? ?Lonia Skinner, RN ? ? ?

## 2021-09-06 ENCOUNTER — Other Ambulatory Visit: Payer: Self-pay | Admitting: Obstetrics and Gynecology

## 2021-09-06 DIAGNOSIS — M545 Low back pain, unspecified: Secondary | ICD-10-CM

## 2021-09-15 ENCOUNTER — Ambulatory Visit: Payer: Self-pay | Admitting: Surgery

## 2021-09-28 ENCOUNTER — Ambulatory Visit: Payer: Self-pay | Admitting: Gerontology

## 2021-10-06 ENCOUNTER — Ambulatory Visit: Payer: Self-pay | Admitting: Family Medicine

## 2021-10-06 VITALS — BP 163/110 | HR 75 | Ht 69.5 in | Wt 214.0 lb

## 2021-10-06 DIAGNOSIS — M545 Low back pain, unspecified: Secondary | ICD-10-CM

## 2021-10-06 DIAGNOSIS — Z720 Tobacco use: Secondary | ICD-10-CM

## 2021-10-06 DIAGNOSIS — I10 Essential (primary) hypertension: Secondary | ICD-10-CM

## 2021-10-06 NOTE — Progress Notes (Signed)
Established Patient Office Visit  Subjective   Patient ID: Christopher Harrell, male    DOB: Aug 08, 1970  Age: 51 y.o. MRN: [id number]  Chief Complaint  Patient presents with   Back Pain   Hypertension    Follow-up    Pt in clinic for follow up on HTN and reporting back pain.  Compliant with medications.  Diet less pork intake, try to eat healthy   Diet recall.  Yesterday-Breakfast - none  Lunch- cubed streak, mc and cheese and greens- home cooked  Dinner - cheeseburger- [employer]tots  Drink- soda 3 can of Sprite.  1 bottle of water.    Day before-  Breakfast -none   Lunch -sausage egg and cheese biscuit- [employer]  Dinner- fried chicken, sting beans canned  Drink - ~3 soda, 1 bottle of water.     Did not go to Ortho appointment d/t needing $200 payment up front.  Reports that back pain       Review of Systems  Constitutional:  Negative for chills, fever, malaise/fatigue and weight loss.  HENT:  Negative for congestion, hearing loss and sore throat.   Eyes:  Negative for blurred vision, double vision and photophobia.  Respiratory:  Negative for shortness of breath.   Cardiovascular:  Negative for chest pain.  Gastrointestinal:  Negative for abdominal pain, blood in stool, constipation, diarrhea, heartburn, nausea and vomiting.  Genitourinary:  Negative for dysuria and frequency.  Musculoskeletal:  Positive for back pain. Negative for joint pain and neck pain.  Skin:  Negative for itching and rash.  Neurological:  Negative for dizziness, weakness and headaches.  Endo/Heme/Allergies:  Does not bruise/bleed easily.  Psychiatric/Behavioral:  Negative for depression, substance abuse and suicidal ideas.       Objective:     BP (!) 163/110   Pulse 75   Ht 5' 9.5" (1.765 m)   Wt 214 lb (97.1 kg)   SpO2 95%   BMI 31.15 kg/m     Physical Exam Constitutional:      Appearance: Normal appearance.  HENT:     Head: Normocephalic.     Mouth/Throat:     Mouth: Mucous  membranes are moist.     Pharynx: Oropharynx is clear. No oropharyngeal exudate.  Cardiovascular:     Rate and Rhythm: Normal rate and regular rhythm.     Pulses: Normal pulses.     Heart sounds: Normal heart sounds.  Pulmonary:     Effort: Pulmonary effort is normal.     Breath sounds: Normal breath sounds.  Abdominal:     Palpations: Abdomen is soft. There is no mass.  Musculoskeletal:     Cervical back: Normal range of motion and neck supple.  Lymphadenopathy:     Cervical: No cervical adenopathy.  Skin:    General: Skin is warm and dry.     Findings: No bruising, erythema, lesion or rash.  Neurological:     Mental Status: He is alert.  Psychiatric:        Mood and Affect: Mood normal.        Behavior: Behavior normal.      No results found for any visits on 10/06/21.  Last CBC Lab Results  Component Value Date   WBC 4.9 05/31/2021   HGB 17.0 05/31/2021   HCT 49.1 05/31/2021   MCV 88.2 05/31/2021   MCH 30.5 05/31/2021   RDW 13.7 05/31/2021   PLT 250 01/74/9449   Last metabolic panel Lab Results  Component Value Date   GLUCOSE  132 (H) 05/31/2021   NA 135 05/31/2021   K 3.9 05/31/2021   CL 104 05/31/2021   CO2 23 05/31/2021   BUN 16 05/31/2021   CREATININE 0.85 05/31/2021   GFRNONAA >60 05/31/2021   CALCIUM 8.9 05/31/2021   PROT 7.4 05/31/2021   ALBUMIN 4.2 05/31/2021   LABGLOB 2.2 12/09/2020   AGRATIO 2.3 (H) 12/09/2020   BILITOT 1.1 05/31/2021   ALKPHOS 55 05/31/2021   AST 21 05/31/2021   ALT 27 05/31/2021   ANIONGAP 8 05/31/2021   Last lipids Lab Results  Component Value Date   CHOL 169 12/09/2020   HDL 44 12/09/2020   LDLCALC 94 12/09/2020   LDLDIRECT 138 (H) 06/13/2018   TRIG 180 (H) 12/09/2020   CHOLHDL 3.8 12/09/2020   Last hemoglobin A1c Lab Results  Component Value Date   HGBA1C 6.0 (H) 05/25/2021   Last thyroid functions Lab Results  Component Value Date   TSH 1.110 06/13/2016   Last vitamin D Lab Results  Component Value  Date   VD25OH 26.4 (L) 12/01/2020      The 10-year ASCVD risk score (Arnett DK, et al., 2019) is: 40.8%    Assessment & Plan:    1. Essential hypertension Discussed healthy eating and finding alternative salt free seasoning such a Ms. Dash.  Reducing soft drink intake, increase water intake.    Discussed hidden salt such as preservatives    Exercise such as walking   2. Chronic bilateral low back pain, unspecified whether sciatica present Pt taking Meloxicam and Flexeril - not helping  Pt did not keep appointment d/t having to make up from payment.  RN will follow up and try to have patient rescheduled.   Discussed core building low impact  exercises such as walking, and swimming   3. Tobacco abuse Discussed smoking cessation Using flavored toothpicks to substitute actions of smoking.  800-Quitline information given.       Return in about 3 months (around 01/06/2022) for follow up .    Chioma Jerold Coombe, NP

## 2021-10-13 ENCOUNTER — Ambulatory Visit (INDEPENDENT_AMBULATORY_CARE_PROVIDER_SITE_OTHER): Payer: Self-pay | Admitting: Urology

## 2021-10-13 ENCOUNTER — Encounter: Payer: Self-pay | Admitting: Urology

## 2021-10-13 VITALS — BP 159/111 | HR 79 | Ht 69.0 in | Wt 214.0 lb

## 2021-10-13 DIAGNOSIS — N529 Male erectile dysfunction, unspecified: Secondary | ICD-10-CM

## 2021-10-13 DIAGNOSIS — R351 Nocturia: Secondary | ICD-10-CM

## 2021-10-13 DIAGNOSIS — R195 Other fecal abnormalities: Secondary | ICD-10-CM

## 2021-10-13 MED ORDER — SILDENAFIL CITRATE 100 MG PO TABS: 100.0000 mg | ORAL_TABLET | Freq: Every day | ORAL | 11 refills | Status: DC | PRN

## 2021-10-13 NOTE — Progress Notes (Signed)
10/13/21 2:11 PM   Christopher Harrell 17-Nov-1970 [id number]  Referring provider:  Langston Reusing, NP [address] [place],  [place] [address]  Chief Complaint  Patient presents with   Erectile Dysfunction    Urological history  1. ED -contributing factors of age, HTN, HLD, diabetes and smoking -testosterone level 306 03/2021 -managed with sildenafil 100 mg, on-demand-dosing   2. BPH -PSA 1.3 05/25/2021 -I PSS 4/4   HPI: Christopher Harrell is a 51 y.o.male who presents today for a 1 monh follow-up with IPSS.   He is having good erections with the sildenafil 100 mg.  It was working really well with just one sildenafil 100 mg, but the last few weeks, he needs to take 150 mg to get a satisfactory erection.    He nocturia x 4. Patient denies any modifying or aggravating factors.  Patient denies any gross hematuria, dysuria or suprapubic/flank pain.  Patient denies any fevers, chills, nausea or vomiting.    He is also having episodes of loose stool and states he does have lactose intolerance.   IPSS     Row Name 10/13/21 1300         International Prostate Symptom Score   How often have you had the sensation of not emptying your bladder? Not at All     How often have you had to urinate less than every two hours? Not at All     How often have you found you stopped and started again several times when you urinated? Not at All     How often have you found it difficult to postpone urination? Not at All     How often have you had a weak urinary stream? Not at All     How often have you had to strain to start urination? Not at All     How many times did you typically get up at night to urinate? 4 Times     Total IPSS Score 4       Quality of Life due to urinary symptoms   If you were to spend the rest of your life with your urinary condition just the way it is now how would you feel about that? Mostly Disatisfied              Score:  1-7 Mild 8-19 Moderate 20-35  Severe    PMH: Past Medical History:  Diagnosis Date   Community acquired pneumonia    a. Dx 01/21/2016 - LLL PNA.   ED (erectile dysfunction)    Essential hypertension    Hyperlipidemia    Tobacco abuse     Surgical History: Past Surgical History:  Procedure Laterality Date   COLONOSCOPY WITH PROPOFOL N/A 04/05/2021   Procedure: COLONOSCOPY WITH PROPOFOL;  Surgeon: Lucilla Lame, MD;  Location: [facility] ENDOSCOPY;  Service: Endoscopy;  Laterality: N/A;   NO PAST SURGERIES      Home Medications:  Allergies as of 10/13/2021   No Known Allergies      Medication List        Accurate as of October 13, 2021  2:11 PM. If you have any questions, ask your nurse or doctor.          albuterol 108 (90 Base) MCG/ACT inhaler Commonly known as: VENTOLIN HFA Inhale 2 puffs into the lungs every 6 (six) hours as needed for wheezing or shortness of breath.   amLODipine 10 MG tablet Commonly known as: NORVASC Take 1 tablet (10 mg total) by  mouth daily.   aspirin EC 81 MG tablet Take 1 tablet (81 mg total) by mouth daily.   atenolol 50 MG tablet Commonly known as: Tenormin Take 1 tablet (50 mg total) by mouth daily.   atorvastatin 10 MG tablet Commonly known as: LIPITOR Take 1 tablet (10 mg total) by mouth daily.   meloxicam 7.5 MG tablet Commonly known as: Mobic Take 1 tablet (7.5 mg total) by mouth daily.   multivitamin with minerals Tabs tablet Take 1 tablet by mouth daily.   pantoprazole 20 MG tablet Commonly known as: Protonix Take 1 tablet (20 mg total) by mouth daily.   sildenafil 100 MG tablet Commonly known as: VIAGRA Take 1 tablet (100 mg total) by mouth daily as needed for erectile dysfunction.   tadalafil 5 MG tablet Commonly known as: CIALIS Take 5-10 mg by mouth daily as needed.   tamsulosin 0.4 MG Caps capsule Commonly known as: FLOMAX Take 1 capsule (0.4 mg total) by mouth daily.   triamterene-hydrochlorothiazide 37.5-25 MG capsule Commonly known as:  DYAZIDE Take 1 each (1 capsule total) by mouth daily.        Allergies:  No Known Allergies  Family History: Family History  Problem Relation Age of Onset   Hypertension Mother    Lung cancer Mother    Hyperlipidemia Mother    Heart attack Father 66   Heart attack Maternal Grandmother     Social History:  reports that he has been smoking cigarettes. He has a 10.00 pack-year smoking history. He has never used smokeless tobacco. He reports that he does not currently use alcohol. He reports that he does not use drugs.   Physical Exam: BP (!) 159/111   Pulse 79   Ht '5\' 9"'$  (1.753 m)   Wt 214 lb (97.1 kg)   BMI 31.60 kg/m   Constitutional:  Well nourished. Alert and oriented, No acute distress. HEENT:  AT, moist mucus membranes.  Trachea midline Cardiovascular: No clubbing, cyanosis, or edema. Respiratory: Normal respiratory effort, no increased work of breathing. Neurologic: Grossly intact, no focal deficits, moving all 4 extremities. Psychiatric: Normal mood and affect.   Laboratory Data: Lab Results  Component Value Date   CREATININE 0.85 05/31/2021   Lab Results  Component Value Date   HGBA1C 6.0 (H) 05/25/2021   Component     Latest Ref Rng 05/25/2021  Prostate Specific Ag, Serum     0.0 - 4.0 ng/mL 1.3    Component     Latest Ref Rng 05/31/2021  Sodium     135 - 145 mmol/L 135   Potassium     3.5 - 5.1 mmol/L 3.9   Chloride     98 - 111 mmol/L 104   CO2     22 - 32 mmol/L 23   Glucose     70 - 99 mg/dL 132 (H)   BUN     6 - 20 mg/dL 16   Creatinine     0.61 - 1.24 mg/dL 0.85   Calcium     8.9 - 10.3 mg/dL 8.9   Anion gap     5 - 15  8   GFR, Estimated     >60 mL/min >60      Component     Latest Ref Rng 05/31/2021  WBC     4.0 - 10.5 K/uL 4.9   RBC     4.22 - 5.81 MIL/uL 5.57   Hemoglobin     13.0 - 17.0 g/dL 17.0  HCT     39.0 - 52.0 % 49.1   MCV     80.0 - 100.0 fL 88.2   MCH     26.0 - 34.0 pg 30.5   MCHC     30.0 - 36.0  g/dL 34.6   RDW     11.5 - 15.5 % 13.7   Platelets     150 - 400 K/uL 250   Neutrophils     Not Estab. %   Lymphs     Not Estab. %   Monocytes     Not Estab. %   Eos     Not Estab. %   Basos     Not Estab. %   NEUT#     1.4 - 7.0 x10E3/uL   Lymphocyte #     0.7 - 3.1 x10E3/uL   Monocytes Absolute     0.1 - 0.9 x10E3/uL   EOS (ABSOLUTE)     0.0 - 0.4 x10E3/uL   Basophils Absolute     0.0 - 0.2 x10E3/uL   Immature Granulocytes     Not Estab. %   Immature Grans (Abs)     0.0 - 0.1 x10E3/uL   Lymphocytes     %   Monocytes Relative     %   Monocyte #     0.2 - 1.0 K/uL   Eosinophil     %   Eosinophils Absolute     0 - 0.7 K/uL   Basophil     %   nRBC     0.0 - 0.2 % 0.0   WBC, UA     0 - 5 /hpf   Epithelial Cells (non renal)     0 - 10 /hpf   Casts     None seen /lpf   Bacteria, UA     None seen/Few      Component     Latest Ref Rng 05/31/2021  Total Protein     6.5 - 8.1 g/dL 7.4   Albumin     3.5 - 5.0 g/dL 4.2   Total Bilirubin     0.3 - 1.2 mg/dL 1.1   Alkaline Phosphatase     38 - 126 U/L 55   AST     15 - 41 U/L 21   ALT     0 - 44 U/L 27   Bilirubin, Direct     0.0 - 0.2 mg/dL 0.1   Indirect Bilirubin     0.3 - 0.9 mg/dL 1.0 (H)   I have reviewed the labs.     Pertinent Imaging: N/A  Assessment & Plan:    1. Nocturia -Patient has not yet taken his tamsulosin as he does not like to take medication -Encourage patient to start his tamsulosin  2. ED -Advised patient to take the Viagra 100 mg on Monday, Wednesday and Fridays as 150 mg of sildenafil is over the recommended limit  3.  Loose bowel movements -Gave patient a handout about how to eliminate lactose from his diet   Return in about 1 month (around 11/12/2021) for IPSS, SHIM and exam.  [facility] [address] [place], [place] [address] [phone]  Larae Grooms, acting as a Education administrator for [facility], PA-C.,have  documented all relevant documentation on the behalf of Erinne Gillentine, PA-C,as directed by  [facility], PA-C while in the presence of Dexter Sauser, PA-C.  I have reviewed the above documentation for accuracy and completeness, and  I agree with the above.    Zara Council, PA-C

## 2021-11-14 NOTE — Progress Notes (Unsigned)
11/15/21 3:28 PM   Christopher Harrell 23-Feb-1971 [id number]  Referring provider:  Langston Reusing, NP [address] [place],  [place] [address]  Urological history  1. ED -contributing factors of age, HTN, HLD, diabetes and smoking -testosterone level 306 03/2021 -SHIM 20 -managed with sildenafil 100 mg, on-demand-dosing   2. BPH -PSA 1.3 05/25/2021 -I PSS 9/2 -PVR 66 mL    HPI: Christopher Harrell is a 51 y.o.male who presents today for a 1 monh follow-up with I PSS and SHIM.   He hast started the tamsulosin 0.4 mg daily and has noticed a decrease of nocturia from 3 to 4 times a night to once a night.  Patient denies any modifying or aggravating factors.  Patient denies any gross hematuria, dysuria or suprapubic/flank pain.  Patient denies any fevers, chills, nausea or vomiting.     IPSS     Row Name 11/15/21 1500         International Prostate Symptom Score   How often have you had the sensation of not emptying your bladder? Not at All     How often have you had to urinate less than every two hours? Less than half the time     How often have you found you stopped and started again several times when you urinated? More than half the time     How often have you found it difficult to postpone urination? Not at All     How often have you had a weak urinary stream? Not at All     How often have you had to strain to start urination? Not at All     How many times did you typically get up at night to urinate? 3 Times     Total IPSS Score 9       Quality of Life due to urinary symptoms   If you were to spend the rest of your life with your urinary condition just the way it is now how would you feel about that? Mostly Satisfied               Score:  1-7 Mild 8-19 Moderate 20-35 Severe   Patient still having spontaneous erections.  He denies any pain or curvature with erections.  Sildenafil is working for him.   SHIM     Row Name 11/15/21 1510         SHIM:  Over the last 6 months:   How do you rate your confidence that you could get and keep an erection? High     When you had erections with sexual stimulation, how often were your erections hard enough for penetration (entering your partner)? Most Times (much more than half the time)     During sexual intercourse, how often were you able to maintain your erection after you had penetrated (entered) your partner? Most Times (much more than half the time)     During sexual intercourse, how difficult was it to maintain your erection to completion of intercourse? Slightly Difficult     When you attempted sexual intercourse, how often was it satisfactory for you? Most Times (much more than half the time)       SHIM Total Score   SHIM 20              Score: 1-7 Severe ED 8-11 Moderate ED 12-16 Mild-Moderate ED 17-21 Mild ED 22-25 No ED    PMH: Past Medical History:  Diagnosis Date   Community acquired  pneumonia    a. Dx 01/21/2016 - LLL PNA.   ED (erectile dysfunction)    Essential hypertension    Hyperlipidemia    Tobacco abuse     Surgical History: Past Surgical History:  Procedure Laterality Date   COLONOSCOPY WITH PROPOFOL N/A 04/05/2021   Procedure: COLONOSCOPY WITH PROPOFOL;  Surgeon: Lucilla Lame, MD;  Location: [facility] ENDOSCOPY;  Service: Endoscopy;  Laterality: N/A;   NO PAST SURGERIES      Home Medications:  Allergies as of 11/15/2021   No Known Allergies      Medication List        Accurate as of November 15, 2021  3:28 PM. If you have any questions, ask your nurse or doctor.          albuterol 108 (90 Base) MCG/ACT inhaler Commonly known as: VENTOLIN HFA Inhale 2 puffs into the lungs every 6 (six) hours as needed for wheezing or shortness of breath.   amLODipine 10 MG tablet Commonly known as: NORVASC Take 1 tablet (10 mg total) by mouth daily.   aspirin EC 81 MG tablet Take 1 tablet (81 mg total) by mouth daily.   atenolol 50 MG tablet Commonly known  as: Tenormin Take 1 tablet (50 mg total) by mouth daily.   atorvastatin 10 MG tablet Commonly known as: LIPITOR Take 1 tablet (10 mg total) by mouth daily.   meloxicam 7.5 MG tablet Commonly known as: Mobic Take 1 tablet (7.5 mg total) by mouth daily.   multivitamin with minerals Tabs tablet Take 1 tablet by mouth daily.   pantoprazole 20 MG tablet Commonly known as: Protonix Take 1 tablet (20 mg total) by mouth daily.   sildenafil 100 MG tablet Commonly known as: VIAGRA Take 1 tablet (100 mg total) by mouth daily as needed for erectile dysfunction.   tadalafil 5 MG tablet Commonly known as: CIALIS Take 5-10 mg by mouth daily as needed.   tamsulosin 0.4 MG Caps capsule Commonly known as: FLOMAX Take 1 capsule (0.4 mg total) by mouth daily.   triamterene-hydrochlorothiazide 37.5-25 MG capsule Commonly known as: DYAZIDE Take 1 each (1 capsule total) by mouth daily.        Allergies:  No Known Allergies  Family History: Family History  Problem Relation Age of Onset   Hypertension Mother    Lung cancer Mother    Hyperlipidemia Mother    Heart attack Father 84   Heart attack Maternal Grandmother     Social History:  reports that he has been smoking cigarettes. He has a 10.00 pack-year smoking history. He has never used smokeless tobacco. He reports that he does not currently use alcohol. He reports that he does not use drugs.   Physical Exam: BP (!) 159/108   Pulse 66   Ht '5\' 9"'$  (1.753 m)   Wt 219 lb (99.3 kg)   BMI 32.34 kg/m   Constitutional:  Well nourished. Alert and oriented, No acute distress. HEENT: [place] AT, moist mucus membranes.  Trachea midline Cardiovascular: No clubbing, cyanosis, or edema. Respiratory: Normal respiratory effort, no increased work of breathing. Neurologic: Grossly intact, no focal deficits, moving all 4 extremities. Psychiatric: Normal mood and affect.   Laboratory Data: N/A  Pertinent Imaging:  Most Recent  Scan Result  74m 11/15/21 15:02    Assessment & Plan:    1. Nocturia -Patient has seen a reduction in the nocturia from 3-4 to once a night with the tamsulosin -Continue tamsulosin 0.4 mg daily  2. ED -Good  response with sildenafil 100 mg prior to intercourse -Continue sildenafil 100 mg, on-demand dosing   Return in about 1 year (around 11/16/2022) for IPSS, SHIM, PSA and exam.  Zara Council, [facility]   [facility] [address] [place], [place] [address] [phone]

## 2021-11-15 ENCOUNTER — Encounter: Payer: Self-pay | Admitting: Urology

## 2021-11-15 ENCOUNTER — Ambulatory Visit (INDEPENDENT_AMBULATORY_CARE_PROVIDER_SITE_OTHER): Payer: Self-pay | Admitting: Urology

## 2021-11-15 VITALS — BP 159/108 | HR 66 | Ht 69.0 in | Wt 219.0 lb

## 2021-11-15 DIAGNOSIS — R351 Nocturia: Secondary | ICD-10-CM

## 2021-11-15 DIAGNOSIS — N529 Male erectile dysfunction, unspecified: Secondary | ICD-10-CM

## 2021-11-15 LAB — BLADDER SCAN AMB NON-IMAGING

## 2021-11-15 MED ORDER — TAMSULOSIN HCL 0.4 MG PO CAPS: 0.4000 mg | ORAL_CAPSULE | Freq: Every day | ORAL | 3 refills | Status: DC

## 2021-11-30 ENCOUNTER — Telehealth: Payer: Self-pay | Admitting: Emergency Medicine

## 2021-11-30 NOTE — Telephone Encounter (Signed)
-----   Message from Langston Reusing, NP sent at 11/30/2021  2:52 PM EDT ----- Pls schedule in clinic appointment on 01/05/22 and make a telephone note. Thank you

## 2021-11-30 NOTE — Telephone Encounter (Signed)
Called patient and scheduled appointment for 01/05/22 @ 2:00pm.

## 2022-01-05 ENCOUNTER — Ambulatory Visit: Payer: Self-pay | Admitting: Gerontology

## 2022-01-17 ENCOUNTER — Ambulatory Visit: Payer: Self-pay | Admitting: Gerontology

## 2022-01-24 ENCOUNTER — Encounter: Payer: Self-pay | Admitting: Adult Health

## 2022-01-24 ENCOUNTER — Ambulatory Visit: Payer: Self-pay | Admitting: Adult Health

## 2022-01-24 ENCOUNTER — Other Ambulatory Visit: Payer: Self-pay

## 2022-01-24 VITALS — BP 163/129 | HR 63 | Temp 98.3°F | Resp 16 | Ht 69.0 in | Wt 198.9 lb

## 2022-01-24 DIAGNOSIS — E559 Vitamin D deficiency, unspecified: Secondary | ICD-10-CM

## 2022-01-24 DIAGNOSIS — N5201 Erectile dysfunction due to arterial insufficiency: Secondary | ICD-10-CM

## 2022-01-24 DIAGNOSIS — J42 Unspecified chronic bronchitis: Secondary | ICD-10-CM

## 2022-01-24 DIAGNOSIS — E119 Type 2 diabetes mellitus without complications: Secondary | ICD-10-CM | POA: Insufficient documentation

## 2022-01-24 DIAGNOSIS — R351 Nocturia: Secondary | ICD-10-CM

## 2022-01-24 DIAGNOSIS — I10 Essential (primary) hypertension: Secondary | ICD-10-CM

## 2022-01-24 DIAGNOSIS — Z720 Tobacco use: Secondary | ICD-10-CM

## 2022-01-24 DIAGNOSIS — E782 Mixed hyperlipidemia: Secondary | ICD-10-CM

## 2022-01-24 DIAGNOSIS — K219 Gastro-esophageal reflux disease without esophagitis: Secondary | ICD-10-CM

## 2022-01-24 MED ORDER — AMLODIPINE BESYLATE 10 MG PO TABS
10.0000 mg | ORAL_TABLET | Freq: Every day | ORAL | 1 refills | Status: DC
  Filled 2022-01-24: qty 90, 90d supply, fill #0
  Filled 2022-05-10: qty 90, 90d supply, fill #1

## 2022-01-24 MED ORDER — TAMSULOSIN HCL 0.4 MG PO CAPS
0.4000 mg | ORAL_CAPSULE | Freq: Every day | ORAL | 3 refills | Status: DC
  Filled 2022-01-24: qty 30, 30d supply, fill #0
  Filled 2022-05-10: qty 30, 30d supply, fill #1
  Filled 2022-07-11: qty 30, 30d supply, fill #2
  Filled 2022-08-30: qty 13, 13d supply, fill #3
  Filled 2022-08-30: qty 17, 17d supply, fill #3

## 2022-01-24 MED ORDER — VITAMIN D (ERGOCALCIFEROL) 1.25 MG (50000 UNIT) PO CAPS
50000.0000 [IU] | ORAL_CAPSULE | ORAL | 2 refills | Status: DC
  Filled 2022-01-24: qty 5, 35d supply, fill #0

## 2022-01-24 MED ORDER — LOSARTAN POTASSIUM 50 MG PO TABS
50.0000 mg | ORAL_TABLET | Freq: Every evening | ORAL | 1 refills | Status: DC
  Filled 2022-01-24: qty 60, 60d supply, fill #0
  Filled 2022-05-10: qty 60, 60d supply, fill #1

## 2022-01-24 MED ORDER — ALBUTEROL SULFATE HFA 108 (90 BASE) MCG/ACT IN AERS
2.0000 | INHALATION_SPRAY | Freq: Four times a day (QID) | RESPIRATORY_TRACT | 1 refills | Status: DC | PRN
  Filled 2022-01-24: qty 6.7, 30d supply, fill #0

## 2022-01-24 MED ORDER — PANTOPRAZOLE SODIUM 20 MG PO TBEC
20.0000 mg | DELAYED_RELEASE_TABLET | Freq: Every day | ORAL | 0 refills | Status: DC
  Filled 2022-01-24: qty 30, 30d supply, fill #0

## 2022-01-24 MED ORDER — SILDENAFIL CITRATE 100 MG PO TABS
100.0000 mg | ORAL_TABLET | Freq: Every day | ORAL | 11 refills | Status: DC | PRN
  Filled 2022-01-24: qty 10, 30d supply, fill #0
  Filled 2022-02-24: qty 10, 30d supply, fill #1
  Filled 2022-04-05: qty 10, 30d supply, fill #2
  Filled 2022-05-10: qty 10, 30d supply, fill #3
  Filled 2022-06-14: qty 10, 30d supply, fill #4
  Filled 2022-07-11: qty 10, 30d supply, fill #5
  Filled 2022-08-30: qty 10, 30d supply, fill #6
  Filled 2022-09-14: qty 10, 30d supply, fill #7
  Filled 2022-10-03: qty 10, 10d supply, fill #8
  Filled 2022-12-01: qty 10, 10d supply, fill #9
  Filled 2022-12-28: qty 10, 10d supply, fill #10
  Filled 2023-01-18: qty 10, 10d supply, fill #11

## 2022-01-24 MED ORDER — ATORVASTATIN CALCIUM 10 MG PO TABS
10.0000 mg | ORAL_TABLET | Freq: Every day | ORAL | 1 refills | Status: DC
  Filled 2022-01-24: qty 90, 90d supply, fill #0
  Filled 2022-05-10: qty 90, 90d supply, fill #1

## 2022-01-24 MED ORDER — TRIAMTERENE-HCTZ 37.5-25 MG PO CAPS
1.0000 | ORAL_CAPSULE | Freq: Every day | ORAL | 1 refills | Status: DC
  Filled 2022-01-24: qty 90, 90d supply, fill #0
  Filled 2022-05-10: qty 90, 90d supply, fill #1

## 2022-01-24 NOTE — Progress Notes (Signed)
Patient: Christopher Harrell Male    DOB: [birth year]   51 y.o.   MRN: [id number] Visit Date: 01/24/2022  Today's Provider: Deforest Hoyles, NP   No chief complaint on file.  Subjective:    HPI Patient is a 51 y/o AA male who is seen for routine follow-up of hypertension, hyperlipidemia, tobacco use disorder and COPD. He reports on/off chest pain x1 week. He describes pain as sharp, rates it as a 6/10 in intensity and lasts about 45 mins-1 hr when it occurs. Aggravating factors-he does not really know because it comes in waves. He believes indigestion is causing the pain. Alleviating factors unknown. Pain is on the right of his chest and is non-radicular. Last episodes was just prior to visit. Patient states that he has not been taking his medications as prescribed. 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 He is out of his GERD medications. He reports eating pizza and had bad acid reflux.  He is out of his BP medications. He is not compliant with salt intake and does not monitor his blood pressure at home. He is not exercising.  He currently works in [employer] and stands a lot.  He is also out of his statin. He denies any side effects when he was taking it. He last used his viagra about 3 days ago. He currently smokes one pack of cigarettes a day. When asked if he is willing to stop smoking, he said yes and No: "Yes because I know it is not good for me, and it is expensive and it is a stinky habbit"; "No because I get stressed and feel like I need something in my hand to calm me down".  He has been using his rescue inhaler daily. He denies cough, dyspnea but reports poor sleep quality.  He has to use the bathroom frequently. He had his colonoscopy. PSA was normal. He is on flomax but has not taken it in a while.  Chest pain has resolved.  No Known Allergies Previous Medications   ALBUTEROL (VENTOLIN HFA) 108 (90 BASE) MCG/ACT INHALER    Inhale 2  puffs into the lungs every 6 (six) hours as needed for wheezing or shortness of breath.   AMLODIPINE (NORVASC) 10 MG TABLET    Take 1 tablet (10 mg total) by mouth daily.   ASPIRIN EC 81 MG TABLET    Take 1 tablet (81 mg total) by mouth daily.   ATENOLOL (TENORMIN) 50 MG TABLET    Take 1 tablet (50 mg total) by mouth daily.   ATORVASTATIN (LIPITOR) 10 MG TABLET    Take 1 tablet (10 mg total) by mouth daily.   MELOXICAM (MOBIC) 7.5 MG TABLET    Take 1 tablet (7.5 mg total) by mouth daily.   MULTIPLE VITAMIN (MULTIVITAMIN WITH MINERALS) TABS TABLET    Take 1 tablet by mouth daily.   PANTOPRAZOLE (PROTONIX) 20 MG TABLET    Take 1 tablet (20 mg total) by mouth daily.   SILDENAFIL (VIAGRA) 100 MG TABLET    Take 1 tablet (100 mg total) by mouth daily as needed for erectile dysfunction.   TADALAFIL (CIALIS) 5 MG TABLET    Take 5-10 mg by mouth daily as needed.   TAMSULOSIN (FLOMAX) 0.4 MG CAPS CAPSULE    Take 1 capsule (0.4 mg total) by mouth daily.   TRIAMTERENE-HYDROCHLOROTHIAZIDE (DYAZIDE) 37.5-25 MG CAPSULE    Take 1 each (1 capsule total) by mouth daily.  Review of Systems  Constitutional:  Positive for fatigue. Negative for chills.  Eyes:  Negative for visual disturbance.  Respiratory:  Negative for cough, shortness of breath and wheezing.   Cardiovascular:  Positive for chest pain. Negative for palpitations and leg swelling.  Gastrointestinal:  Negative for abdominal pain.  Endocrine: Positive for polyuria. Negative for polydipsia.  Genitourinary:  Positive for frequency.       Erectile dysfunction  Neurological: Negative.     Social History   Tobacco Use   Smoking status: Every Day    Packs/day: 0.50    Years: 20.00    Total pack years: 10.00    Types: Cigarettes   Smokeless tobacco: Never   Tobacco comments:    Tried gum.  Substance Use Topics   Alcohol use: Not Currently   Objective:   There were no vitals taken for this visit.  Physical Exam Vitals and nursing note  reviewed.  Constitutional:      Appearance: He is well-developed.  Eyes:     Extraocular Movements: Extraocular movements intact.     Pupils: Pupils are equal, round, and reactive to light.  Cardiovascular:     Rate and Rhythm: Normal rate and regular rhythm.     Pulses:          Radial pulses are 2+ on the right side and 2+ on the left side.     Heart sounds: Normal heart sounds.  Pulmonary:     Effort: No tachypnea, accessory muscle usage or respiratory distress.     Breath sounds: Normal breath sounds.  Chest:     Chest wall: No tenderness or edema.  Abdominal:     General: Bowel sounds are normal.     Palpations: Abdomen is soft.  Musculoskeletal:        General: Normal range of motion.     Cervical back: Normal range of motion and neck supple.  Skin:    General: Skin is warm and dry.     Capillary Refill: Capillary refill takes less than 2 seconds.  Neurological:     General: No focal deficit present.     Mental Status: He is alert and oriented to person, place, and time.  Psychiatric:        Mood and Affect: Mood normal.         Assessment & Plan:   1. Essential hypertension Uncontrolled hypertension due to medication non-adherence. Need for medication adherence reinforced with patient and his girlfriend.  - triamterene-hydrochlorothiazide (DYAZIDE) 37.5-25 MG capsule; Take 1 capsule by mouth daily.  Dispense: 90 capsule; Refill: 1 - amLODipine (NORVASC) 10 MG tablet; Take 1 tablet (10 mg total) by mouth daily.  Dispense: 90 tablet; Refill: 1 - Comp Met (CMET); Future  2. Nocturia F/U with urology as planned. Continue flomax. - tamsulosin (FLOMAX) 0.4 MG CAPS capsule; Take 1 capsule (0.4 mg total) by mouth daily.  Dispense: 30 capsule; Refill: 3 - sildenafil (VIAGRA) 100 MG tablet; Take 1 tablet (100 mg total) by mouth daily as needed for erectile dysfunction.  Dispense: 15 tablet; Refill: 11 - HgB A1c; Future - Ambulatory referral to Urology  3. Mixed  hyperlipidemia Continue current treatment. WIll obtain repeat labs at night visit - atorvastatin (LIPITOR) 10 MG tablet; Take 1 tablet (10 mg total) by mouth daily.  Dispense: 90 tablet; Refill: 1 - Lipid Profile; Future  4. Chronic bronchitis, unspecified chronic bronchitis type ([place]) Increased use of rescue inhaler. I have advised patient on smoking cessation and the  need to start by cutting back. He has agreed to decrease his current daily smoking by 2 cigarettes/day. - albuterol (VENTOLIN HFA) 108 (90 Base) MCG/ACT inhaler; Inhale 2 puffs into the lungs every 6 (six) hours as needed for wheezing or shortness of breath.  Dispense: 6.7 g; Refill: 1  5. Tobacco abuse As above.  6. Gastroesophageal reflux disease without esophagitis Resume protonix. Will review life style modification with patient next visit  7. Erectile dysfunction due to arterial insufficiency Continue prn viagra and f/u with urology as scheduled.   8. Vitamin D deficiency Patient started on vitamin D 50,000 units q week  RTC in 1 week Pick up prescriptions  Deforest Hoyles, NP   [facility]

## 2022-01-26 ENCOUNTER — Other Ambulatory Visit: Payer: Self-pay

## 2022-01-27 ENCOUNTER — Other Ambulatory Visit: Payer: Self-pay

## 2022-01-31 ENCOUNTER — Ambulatory Visit: Payer: Self-pay | Admitting: Gerontology

## 2022-02-02 ENCOUNTER — Other Ambulatory Visit: Payer: Self-pay

## 2022-02-24 ENCOUNTER — Other Ambulatory Visit: Payer: Self-pay

## 2022-02-28 ENCOUNTER — Other Ambulatory Visit: Payer: Self-pay

## 2022-03-29 ENCOUNTER — Ambulatory Visit: Payer: Self-pay | Admitting: Gerontology

## 2022-04-04 ENCOUNTER — Encounter: Payer: Self-pay | Admitting: Gerontology

## 2022-04-04 ENCOUNTER — Ambulatory Visit: Payer: Self-pay | Admitting: Gerontology

## 2022-04-04 ENCOUNTER — Other Ambulatory Visit: Payer: Self-pay

## 2022-04-04 VITALS — BP 148/100 | HR 97 | Temp 97.4°F | Resp 16 | Ht 69.0 in | Wt 184.2 lb

## 2022-04-04 DIAGNOSIS — R7303 Prediabetes: Secondary | ICD-10-CM

## 2022-04-04 DIAGNOSIS — I10 Essential (primary) hypertension: Secondary | ICD-10-CM

## 2022-04-04 DIAGNOSIS — M549 Dorsalgia, unspecified: Secondary | ICD-10-CM | POA: Insufficient documentation

## 2022-04-04 LAB — POCT GLYCOSYLATED HEMOGLOBIN (HGB A1C): Hemoglobin A1C: 5.6 % (ref 4.0–5.6)

## 2022-04-04 LAB — GLUCOSE, POCT (MANUAL RESULT ENTRY): POC Glucose: 87 mg/dl (ref 70–99)

## 2022-04-04 MED ORDER — PREDNISONE 10 MG PO TABS
10.0000 mg | ORAL_TABLET | Freq: Every day | ORAL | 0 refills | Status: DC
  Filled 2022-04-04: qty 5, 5d supply, fill #0

## 2022-04-04 MED ORDER — IBUPROFEN 600 MG PO TABS
600.0000 mg | ORAL_TABLET | Freq: Two times a day (BID) | ORAL | 0 refills | Status: DC | PRN
  Filled 2022-04-04: qty 14, 7d supply, fill #0

## 2022-04-04 NOTE — Progress Notes (Unsigned)
Established Patient Office Visit  Subjective   Patient ID: Christopher Harrell, male    DOB: [birth year]  Age: 51 y.o. MRN: [id number]  Chief Complaint  Patient presents with   Follow-up    HPI Muhannad Bignell is a 51 y/o AA male who is seen for routine follow-up of hypertension, hyperlipidemia, tobacco use disorder and COPD who presents for routine follow up.He states that he misses his medication sometimes. His blood pressure is elevated, he does not check his blood pressure at home, but continues to make healthy lifestyle changes. He c/o non traumatic  intermittent sharp pain to his left upper back , below the clavicle, that radiates to his left upper chest wall that has been going on for one month. He describes pain as sharp, pulsating and it's an 8/10 when touched. He states that taking tylenol and aleve offered minimal relief. He denies muscle and motor weakness. States that lifting aggravates pain. He denies chest pain, palpitation, vision changes and offers no further complaint.   Review of Systems  Constitutional: Negative.   Eyes: Negative.   Respiratory: Negative.    Cardiovascular: Negative.   Musculoskeletal:  Positive for back pain (tenderness to left upper back radiating to left upper chest wall).  Neurological: Negative.   Psychiatric/Behavioral: Negative.        Objective:     BP (!) 148/100 (BP Location: Right Arm, Patient Position: Sitting, Cuff Size: Large)   Pulse 97   Temp (!) 97.4 F (36.3 C)   Resp 16   Ht '5\' 9"'$  (1.753 m)   Wt 184 lb 3.2 oz (83.6 kg)   SpO2 96%   BMI 27.20 kg/m  BP Readings from Last 3 Encounters:  04/04/22 (!) 148/100  01/24/22 (!) 163/129  11/15/21 (!) 159/108   Wt Readings from Last 3 Encounters:  04/04/22 184 lb 3.2 oz (83.6 kg)  01/24/22 198 lb 14.4 oz (90.2 kg)  11/15/21 219 lb (99.3 kg)      Physical Exam HENT:     Head: Normocephalic and atraumatic.     Mouth/Throat:     Mouth: Mucous membranes are moist.  Eyes:      Extraocular Movements: Extraocular movements intact.     Conjunctiva/sclera: Conjunctivae normal.     Pupils: Pupils are equal, round, and reactive to light.  Cardiovascular:     Rate and Rhythm: Normal rate and regular rhythm.     Pulses: Normal pulses.     Heart sounds: Normal heart sounds.  Pulmonary:     Effort: Pulmonary effort is normal.     Breath sounds: Normal breath sounds.  Musculoskeletal:        General: Tenderness (palpation to left upper back, clavicle and left upper chest wall, no swelling nor erythema.) present.  Skin:    General: Skin is warm.  Neurological:     General: No focal deficit present.     Mental Status: He is alert and oriented to person, place, and time. Mental status is at baseline.  Psychiatric:        Mood and Affect: Mood normal.        Behavior: Behavior normal.        Thought Content: Thought content normal.        Judgment: Judgment normal.      Results for orders placed or performed in visit on 04/04/22  POCT Glucose (CBG)  Result Value Ref Range   POC Glucose 87 70 - 99 mg/dl  POCT HgB A1C  Result  Value Ref Range   Hemoglobin A1C 5.6 4.0 - 5.6 %   HbA1c POC (<> result, manual entry)     HbA1c, POC (prediabetic range)     HbA1c, POC (controlled diabetic range)      Last CBC Lab Results  Component Value Date   WBC 4.9 05/31/2021   HGB 17.0 05/31/2021   HCT 49.1 05/31/2021   MCV 88.2 05/31/2021   MCH 30.5 05/31/2021   RDW 13.7 05/31/2021   PLT 250 04/54/0981   Last metabolic panel Lab Results  Component Value Date   GLUCOSE 132 (H) 05/31/2021   NA 135 05/31/2021   K 3.9 05/31/2021   CL 104 05/31/2021   CO2 23 05/31/2021   BUN 16 05/31/2021   CREATININE 0.85 05/31/2021   GFRNONAA >60 05/31/2021   CALCIUM 8.9 05/31/2021   PROT 7.4 05/31/2021   ALBUMIN 4.2 05/31/2021   LABGLOB 2.2 12/09/2020   AGRATIO 2.3 (H) 12/09/2020   BILITOT 1.1 05/31/2021   ALKPHOS 55 05/31/2021   AST 21 05/31/2021   ALT 27 05/31/2021    ANIONGAP 8 05/31/2021   Last lipids Lab Results  Component Value Date   CHOL 169 12/09/2020   HDL 44 12/09/2020   LDLCALC 94 12/09/2020   LDLDIRECT 138 (H) 06/13/2018   TRIG 180 (H) 12/09/2020   CHOLHDL 3.8 12/09/2020   Last hemoglobin A1c Lab Results  Component Value Date   HGBA1C 5.6 04/04/2022      The 10-year ASCVD risk score (Arnett DK, et al., 2019) is: 36.7%    Assessment & Plan:   1. Prediabetes - His HgbA1c decreased to 5.6%. - POCT HgB A1C; Future - POCT Glucose (CBG); Future - POCT Glucose (CBG) - POCT HgB A1C - Urine Microalbumin w/creat. ratio; Future  2. Essential hypertension - His blood pressure is improving, goal should be less than 140/90. He will continue current medication, DASH diet and exercise as tolerated.  3. Other acute back pain - Back pain likely muscle sprain, or overuse, was started on Prednisone for possible inflammation, and Ibuprofen after prednisone. He was educated on medication side effects and advised to notify clinic. - predniSONE (DELTASONE) 10 MG tablet; Take 1 tablet (10 mg total) by mouth daily with breakfast.  Dispense: 5 tablet; Refill: 0 - ibuprofen (ADVIL) 600 MG tablet; Take 1 tablet (600 mg total) by mouth 2 (two) times daily as needed.  Dispense: 14 tablet; Refill: 0    Return in about 8 days (around 04/12/2022), or if symptoms worsen or fail to improve.    Mahmud Keithly Jerold Coombe, NP

## 2022-04-05 ENCOUNTER — Other Ambulatory Visit: Payer: Self-pay

## 2022-04-07 ENCOUNTER — Other Ambulatory Visit: Payer: Self-pay

## 2022-04-07 ENCOUNTER — Other Ambulatory Visit: Payer: Self-pay | Admitting: Gerontology

## 2022-04-10 ENCOUNTER — Other Ambulatory Visit: Payer: Self-pay

## 2022-04-11 ENCOUNTER — Other Ambulatory Visit: Payer: Self-pay

## 2022-04-11 MED FILL — Pantoprazole Sodium EC Tab 20 MG (Base Equiv): ORAL | 16 days supply | Qty: 16 | Fill #0 | Status: CN

## 2022-04-11 MED FILL — Pantoprazole Sodium EC Tab 20 MG (Base Equiv): ORAL | 14 days supply | Qty: 14 | Fill #0 | Status: CN

## 2022-04-11 NOTE — Telephone Encounter (Signed)
Needs to keep OV 04/12/22

## 2022-04-12 ENCOUNTER — Ambulatory Visit: Payer: Self-pay | Admitting: Gerontology

## 2022-04-20 ENCOUNTER — Other Ambulatory Visit: Payer: Self-pay

## 2022-05-10 ENCOUNTER — Other Ambulatory Visit: Payer: Self-pay

## 2022-05-25 ENCOUNTER — Emergency Department
Admission: EM | Admit: 2022-05-25 | Discharge: 2022-05-25 | Discharge status: Against Medical Advice | Payer: Self-pay | Attending: Emergency Medicine | Admitting: Emergency Medicine

## 2022-05-25 DIAGNOSIS — R220 Localized swelling, mass and lump, head: Secondary | ICD-10-CM | POA: Insufficient documentation

## 2022-05-25 DIAGNOSIS — Z5321 Procedure and treatment not carried out due to patient leaving prior to being seen by health care provider: Secondary | ICD-10-CM | POA: Insufficient documentation

## 2022-05-25 DIAGNOSIS — K0889 Other specified disorders of teeth and supporting structures: Secondary | ICD-10-CM | POA: Insufficient documentation

## 2022-05-25 DIAGNOSIS — R42 Dizziness and giddiness: Secondary | ICD-10-CM | POA: Insufficient documentation

## 2022-05-25 MED ORDER — IBUPROFEN 800 MG PO TABS
800.0000 mg | ORAL_TABLET | Freq: Once | ORAL | Status: AC
  Administered 2022-05-25: 800 mg via ORAL
  Filled 2022-05-25: qty 1

## 2022-05-25 NOTE — ED Notes (Signed)
EKG performed, pt states he does not want any blood work obtained

## 2022-05-25 NOTE — ED Triage Notes (Signed)
Pt arrived via EMS. Pt reports right sided dental pain and facial swelling for the past week. C/O dizziness for the past two days from the pain and not eating as much due to the dental pain.

## 2022-05-25 NOTE — ED Triage Notes (Signed)
First Nurse Note  Pt coming in with dizziness x2 days. 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  98% 171/119 98.3 F

## 2022-06-14 ENCOUNTER — Other Ambulatory Visit: Payer: Self-pay

## 2022-07-06 ENCOUNTER — Other Ambulatory Visit: Payer: Self-pay

## 2022-07-06 ENCOUNTER — Ambulatory Visit: Payer: Self-pay | Admitting: Gerontology

## 2022-07-06 ENCOUNTER — Encounter: Payer: Self-pay | Admitting: Gerontology

## 2022-07-06 VITALS — BP 119/87 | HR 99 | Temp 98.5°F | Resp 16 | Ht 68.0 in | Wt 175.1 lb

## 2022-07-06 DIAGNOSIS — M549 Dorsalgia, unspecified: Secondary | ICD-10-CM

## 2022-07-06 DIAGNOSIS — Z0000 Encounter for general adult medical examination without abnormal findings: Secondary | ICD-10-CM

## 2022-07-06 DIAGNOSIS — I10 Essential (primary) hypertension: Secondary | ICD-10-CM

## 2022-07-06 DIAGNOSIS — E782 Mixed hyperlipidemia: Secondary | ICD-10-CM

## 2022-07-06 DIAGNOSIS — K0381 Cracked tooth: Secondary | ICD-10-CM | POA: Insufficient documentation

## 2022-07-06 MED ORDER — IBUPROFEN 800 MG PO TABS
800.0000 mg | ORAL_TABLET | Freq: Every day | ORAL | 0 refills | Status: DC
  Filled 2022-07-06: qty 20, 20d supply, fill #0

## 2022-07-06 MED ORDER — TRIAMTERENE-HCTZ 37.5-25 MG PO CAPS
1.0000 | ORAL_CAPSULE | Freq: Every day | ORAL | 1 refills | Status: DC
  Filled 2022-07-06 – 2022-08-30 (×2): qty 90, 90d supply, fill #0

## 2022-07-06 MED ORDER — LOSARTAN POTASSIUM 50 MG PO TABS
50.0000 mg | ORAL_TABLET | Freq: Every evening | ORAL | 1 refills | Status: DC
  Filled 2022-07-06 – 2022-07-11 (×2): qty 90, 90d supply, fill #0

## 2022-07-06 MED ORDER — ATORVASTATIN CALCIUM 10 MG PO TABS
10.0000 mg | ORAL_TABLET | Freq: Every day | ORAL | 1 refills | Status: DC
  Filled 2022-07-06 – 2022-08-30 (×2): qty 90, 90d supply, fill #0

## 2022-07-06 MED ORDER — AMLODIPINE BESYLATE 10 MG PO TABS
10.0000 mg | ORAL_TABLET | Freq: Every day | ORAL | 1 refills | Status: DC
  Filled 2022-07-06 – 2022-08-30 (×2): qty 90, 90d supply, fill #0

## 2022-07-06 NOTE — Progress Notes (Signed)
Established Patient Office Visit  Subjective   Patient ID: Christopher Harrell, male    DOB: 1970-11-26  Age: 52 y.o. MRN: [id number]  Chief Complaint  Patient presents with   Follow-up   Hypertension    Patient states he has been checking his blood pressure at home and it has been "kinda high". Patient did not bring a log of readings with him.   Dental Pain    Patient c/o dental pain x 2 months. He states he has a hole in his tooth. Patient states he has been unable to eat very well.    Hypertension Associated symptoms include blurred vision and headaches.  Dental Pain    Steve Krenz is a 52 y/o AA male who is seen for routine follow-up of hypertension, hyperlipidemia, tobacco use disorder and COPD who presents today with a chief complain of his blood pressure being high, feeling light headed and dizzy so he was not able to make it to work this morning. He checked his blood pressure and it was 154/108. He denies any chest pain, palpitations, shortness of breath. Also denies any nausea or vomiting. Patient reports being compliant with his medications and denies side effects.  Currently, he c/o tooth ache to the right lower molar. He states that the tooth is broken and it is difficult to chew his food. It has been going on for about 3 months.  He denies swelling, and erythema .Also states that his lower back has been hurting and the pain is constant all day and night.  Rates his pain as 9 out of 10. The back pain isaggravated by lifting at work. He denies bladder/bowel incontinence and saddle anesthesia. He states that taking 600 mg Ibuprofen minimally relieve symptoms. He is in no distress that requires immediate intervention. Overall, he states that he's doing well and offers no further complaint.    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 Dx 01/21/2016 - [facility] PNA.   ED (erectile dysfunction)    Essential hypertension    Hyperlipidemia    Tobacco abuse    Past Surgical History:  Procedure Laterality Date   COLONOSCOPY WITH PROPOFOL N/A 04/05/2021   Procedure: COLONOSCOPY WITH PROPOFOL;  Surgeon: Lucilla Lame, MD;  Location: [facility] ENDOSCOPY;  Service: Endoscopy;  Laterality: N/A;   Social History   Tobacco Use   Smoking status: Every Day    Packs/day: 0.50    Years: 20.00    Total pack years: 10.00    Types: Cigarettes   Smokeless tobacco: Never   Tobacco comments:    Tried gum.  Vaping Use   Vaping Use: Never used  Substance Use Topics  Alcohol use: Yes    Alcohol/week: 4.0 standard drinks of alcohol    Types: 2 Glasses of wine, 2 Cans of beer per week    Comment: 1 beer and wine twice per week   Drug use: No   Family History  Problem Relation Age of Onset   Hypertension Mother    Lung cancer Mother    Hyperlipidemia Mother    Heart attack Father 79   Heart attack Maternal Grandmother    Other Maternal Grandfather        unknown medical history   Other Paternal Grandmother        unknown medical history   Other Paternal Grandfather        unknown medical history   No Known Allergies    Review of Systems   Constitutional: Negative.   HENT: Negative.    Eyes:  Positive for blurred vision.  Respiratory: Negative.    Cardiovascular: Negative.   Gastrointestinal: Negative.   Genitourinary: Negative.   Musculoskeletal:  Positive for back pain (constant pain all day and night).  Neurological:  Positive for dizziness and headaches.  Psychiatric/Behavioral: Negative.        Objective:     BP 119/87 (BP Location: Right Arm, Patient Position: Sitting, Cuff Size: Large)   Pulse 99   Temp 98.5 F (36.9 C) (Oral)   Resp 16   Ht '5\' 8"'$  (1.727 m)   Wt 175 lb 1.6 oz (79.4 kg)   SpO2 94%   BMI 26.62 kg/m  BP Readings from Last 3 Encounters:  07/06/22 119/87  04/04/22 (!) 148/100  01/24/22 (!) 163/129   Wt Readings from Last 3 Encounters:  07/06/22 175 lb 1.6 oz (79.4 kg)  04/04/22 184 lb 3.2 oz (83.6 kg)  01/24/22 198 lb 14.4 oz (90.2 kg)      Physical Exam Constitutional:      Appearance: Normal appearance. He is normal weight.  HENT:     Head: Normocephalic.     Nose: Nose normal.     Mouth/Throat:     Mouth: Mucous membranes are dry.     Pharynx: Oropharynx is clear.  Eyes:     Extraocular Movements: Extraocular movements intact.     Conjunctiva/sclera: Conjunctivae normal.     Pupils: Pupils are equal, round, and reactive to light.  Cardiovascular:     Rate and Rhythm: Normal rate and regular rhythm.     Pulses: Normal pulses.     Heart sounds: Normal heart sounds.  Pulmonary:     Effort: Pulmonary effort is normal.     Breath sounds: Normal breath sounds.  Abdominal:     General: Abdomen is flat. Bowel sounds are normal.     Palpations: Abdomen is soft.  Musculoskeletal:        General: Normal range of motion.     Cervical back: Normal range of motion.  Skin:    General: Skin is warm and dry.  Neurological:     General: No focal deficit present.     Mental Status: He is alert and oriented to person, place, and time. Mental status is at baseline.  Psychiatric:         Mood and Affect: Mood normal.        Behavior: Behavior normal.        Thought Content: Thought content normal.        Judgment: Judgment normal.      No results found for any visits on 07/06/22.  Last CBC  Lab Results  Component Value Date   WBC 4.9 05/31/2021   HGB 17.0 05/31/2021   HCT 49.1 05/31/2021   MCV 88.2 05/31/2021   MCH 30.5 05/31/2021   RDW 13.7 05/31/2021   PLT 250 [id number]   Last metabolic panel Lab Results  Component Value Date   GLUCOSE 132 (H) 05/31/2021   NA 135 05/31/2021   K 3.9 05/31/2021   CL 104 05/31/2021   CO2 23 05/31/2021   BUN 16 05/31/2021   CREATININE 0.85 05/31/2021   GFRNONAA >60 05/31/2021   CALCIUM 8.9 05/31/2021   PROT 7.4 05/31/2021   ALBUMIN 4.2 05/31/2021   LABGLOB 2.2 12/09/2020   AGRATIO 2.3 (H) 12/09/2020   BILITOT 1.1 05/31/2021   ALKPHOS 55 05/31/2021   AST 21 05/31/2021   ALT 27 05/31/2021   ANIONGAP 8 05/31/2021   Last lipids Lab Results  Component Value Date   CHOL 169 12/09/2020   HDL 44 12/09/2020   LDLCALC 94 12/09/2020   LDLDIRECT 138 (H) 06/13/2018   TRIG 180 (H) 12/09/2020   CHOLHDL 3.8 12/09/2020   Last hemoglobin A1c Lab Results  Component Value Date   HGBA1C 5.6 04/04/2022   Last thyroid functions Lab Results  Component Value Date   TSH 1.110 06/13/2016   Last vitamin D Lab Results  Component Value Date   VD25OH 26.4 (L) 12/01/2020   Last vitamin B12 and Folate No results found for: "VITAMINB12", "FOLATE"    The 10-year ASCVD risk score (Arnett DK, et al., 2019) is: 26%    Assessment & Plan:  1. Essential hypertension - His blood pressure is not under control, his goal should be less than 140/90.  - He was encouraged to continuing taking medication as prescribed. Also encouraged to DASH diet and daily 30 minutes exercises as tolerated. Continue to check blood pressure daily and log readings.  - amLODipine (NORVASC) 10 MG tablet; Take 1 tablet (10 mg total) by mouth daily.   Dispense: 90 tablet; Refill: 1 - losartan (COZAAR) 50 MG tablet; Take 1 tablet (50 mg total) by mouth at bedtime.  Dispense: 90 tablet; Refill: 1 - triamterene-hydrochlorothiazide (DYAZIDE) 37.5-25 MG capsule; Take 1 capsule by mouth daily.  Dispense: 90 capsule; Refill: 1  2. Mixed hyperlipidemia - The 10-year ASCVD risk score (Arnett DK, et al., 2019) is: 26%   Values used to calculate the score:     Age: 19 years     Sex: Male     Is Non-Hispanic African American: Yes     Diabetic: Yes     Tobacco smoker: Yes     Systolic Blood Pressure: 123456 mmHg     Is BP treated: Yes     HDL Cholesterol: 44 mg/dL     Total Cholesterol: 169 mg/dL -His ASCVD risk was 26%,he will continue current medication,low fat/cholesterol diet and exercises as tolerated. - atorvastatin (LIPITOR) 10 MG tablet; Take 1 tablet (10 mg total) by mouth daily.  Dispense: 90 tablet; Refill: 1  3. Health care maintenance Routine blood work ordered today - Lipid panel; Future - Comp Met (CMET); Future - CBC w/Diff; Future - Urine Microalbumin w/creat. ratio; Future - CBC w/Diff - Comp Met (CMET) - Lipid panel  4. Cracked tooth - His right lower molar tooth was cracked, no abscess nor erythema noted. He was provided with a dental application and a referral asap, Ibuprofen 800 mg for the dental pain.  - ibuprofen (ADVIL) 800 MG tablet; Take 1 tablet (800 mg total) by  mouth daily.  Dispense: 20 tablet; Refill: 0  5. Other acute back pain - His back pain is chronic, will continue on Ibuprofen order for back pain. Educated patient on proper body mechanics when lifting to avoid injury to back. - ibuprofen (ADVIL) 800 MG tablet; Take 1 tablet (800 mg total) by mouth daily.  Dispense: 20 tablet; Refill: 0     Return in about 4 weeks (around 08/03/2022), or if symptoms worsen or fail to improve.    [place] Jerold Coombe, NP

## 2022-07-06 NOTE — Patient Instructions (Signed)
DASH Eating PlanHeart-Healthy Eating Plan Many factors influence your heart health, including eating and exercise habits. Heart health is also called coronary health. Coronary risk increases with abnormal blood fat (lipid) levels. A heart-healthy eating plan includes limiting unhealthy fats, increasing healthy fats, limiting salt (sodium) intake, and making other diet and lifestyle changes. What is my plan? Your health care provider may recommend that: You limit your fat intake to _________% or less of your total calories each day. You limit your saturated fat intake to _________% or less of your total calories each day. You limit the amount of cholesterol in your diet to less than _________ mg per day. You limit the amount of sodium in your diet to less than _________ mg per day. What are tips for following this plan? Cooking Cook foods using methods other than frying. Baking, boiling, grilling, and broiling are all good options. Other ways to reduce fat include: Removing the skin from poultry. Removing all visible fats from meats. Steaming vegetables in water or broth. Meal planning  At meals, imagine dividing your plate into fourths: Fill one-half of your plate with vegetables and green salads. Fill one-fourth of your plate with whole grains. Fill one-fourth of your plate with lean protein foods. Eat 2-4 cups of vegetables per day. One cup of vegetables equals 1 cup (91 g) broccoli or cauliflower florets, 2 medium carrots, 1 large bell pepper, 1 large sweet potato, 1 large tomato, 1 medium white potato, 2 cups (150 g) raw leafy greens. Eat 1-2 cups of fruit per day. One cup of fruit equals 1 small apple, 1 large banana, 1 cup (237 g) mixed fruit, 1 large orange,  cup (82 g) dried fruit, 1 cup (240 mL) 100% fruit juice. Eat more foods that contain soluble fiber. Examples include apples, broccoli, carrots, beans, peas, and barley. Aim to get 25-30 g of fiber per day. Increase your  consumption of legumes, nuts, and seeds to 4-5 servings per week. One serving of dried beans or legumes equals  cup (90 g) cooked, 1 serving of nuts is  oz (12 almonds, 24 pistachios, or 7 walnut halves), and 1 serving of seeds equals  oz (8 g). Fats Choose healthy fats more often. Choose monounsaturated and polyunsaturated fats, such as olive and canola oils, avocado oil, flaxseeds, walnuts, almonds, and seeds. Eat more omega-3 fats. Choose salmon, mackerel, sardines, tuna, flaxseed oil, and ground flaxseeds. Aim to eat fish at least 2 times each week. Check food labels carefully to identify foods with trans fats or high amounts of saturated fat. Limit saturated fats. These are found in animal products, such as meats, butter, and cream. Plant sources of saturated fats include palm oil, palm kernel oil, and coconut oil. Avoid foods with partially hydrogenated oils in them. These contain trans fats. Examples are stick margarine, some tub margarines, cookies, crackers, and other baked goods. Avoid fried foods. General information Eat more home-cooked food and less restaurant, buffet, and fast food. Limit or avoid alcohol. Limit foods that are high in added sugar and simple starches such as foods made using white refined flour (white breads, pastries, sweets). Lose weight if you are overweight. Losing just 5-10% of your body weight can help your overall health and prevent diseases such as diabetes and heart disease. Monitor your sodium intake, especially if you have high blood pressure. Talk with your health care provider about your sodium intake. Try to incorporate more vegetarian meals weekly. What foods should I eat? Fruits All fresh, canned (  in natural juice), or frozen fruits. Vegetables Fresh or frozen vegetables (raw, steamed, roasted, or grilled). Green salads. Grains Most grains. Choose whole wheat and whole grains most of the time. Rice and pasta, including brown rice and pastas made  with whole wheat. Meats and other proteins Lean, well-trimmed beef, veal, pork, and lamb. Chicken and [place] without skin. All fish and shellfish. Wild duck, rabbit, pheasant, and venison. Egg whites or low-cholesterol egg substitutes. Dried beans, peas, lentils, and tofu. Seeds and most nuts. Dairy Low-fat or nonfat cheeses, including ricotta and mozzarella. Skim or 1% milk (liquid, powdered, or evaporated). Buttermilk made with low-fat milk. Nonfat or low-fat yogurt. Fats and oils Non-hydrogenated (trans-free) margarines. Vegetable oils, including soybean, sesame, sunflower, olive, avocado, peanut, safflower, corn, canola, and cottonseed. Salad dressings or mayonnaise made with a vegetable oil. Beverages Water (mineral or sparkling). Coffee and tea. Unsweetened ice tea. Diet beverages. Sweets and desserts Sherbet, gelatin, and fruit ice. Small amounts of dark chocolate. Limit all sweets and desserts. Seasonings and condiments All seasonings and condiments. The items listed above may not be a complete list of foods and beverages you can eat. Contact a dietitian for more options. What foods should I avoid? Fruits Canned fruit in heavy syrup. Fruit in cream or butter sauce. Fried fruit. Limit coconut. Vegetables Vegetables cooked in cheese, cream, or butter sauce. Fried vegetables. Grains Breads made with saturated or trans fats, oils, or whole milk. Croissants. Sweet rolls. Donuts. High-fat crackers, such as cheese crackers and chips. Meats and other proteins Fatty meats, such as hot dogs, ribs, sausage, bacon, rib-eye roast or steak. High-fat deli meats, such as salami and bologna. Caviar. Domestic duck and goose. Organ meats, such as liver. Dairy Cream, sour cream, cream cheese, and creamed cottage cheese. Whole-milk cheeses. Whole or 2% milk (liquid, evaporated, or condensed). Whole buttermilk. Cream sauce or high-fat cheese sauce. Whole-milk yogurt. Fats and oils Meat fat, or  shortening. Cocoa butter, hydrogenated oils, palm oil, coconut oil, palm kernel oil. Solid fats and shortenings, including bacon fat, salt pork, lard, and butter. Nondairy cream substitutes. Salad dressings with cheese or sour cream. Beverages Regular sodas and any drinks with added sugar. Sweets and desserts Frosting. Pudding. Cookies. Cakes. Pies. Milk chocolate or white chocolate. Buttered syrups. Full-fat ice cream or ice cream drinks. The items listed above may not be a complete list of foods and beverages to avoid. Contact a dietitian for more information. Summary Heart-healthy meal planning includes limiting unhealthy fats, increasing healthy fats, limiting salt (sodium) intake and making other diet and lifestyle changes. Lose weight if you are overweight. Losing just 5-10% of your body weight can help your overall health and prevent diseases such as diabetes and heart disease. Focus on eating a balance of foods, including fruits and vegetables, low-fat or nonfat dairy, lean protein, nuts and legumes, whole grains, and heart-healthy oils and fats. This information is not intended to replace advice given to you by your health care provider. Make sure you discuss any questions you have with your health care provider. Document Revised: 05/23/2021 Document Reviewed: 05/23/2021 Elsevier Patient Education  [place] stands for Dietary Approaches to Stop Hypertension. The DASH eating plan is a healthy eating plan that has been shown to: Reduce high blood pressure (hypertension). Reduce your risk for type 2 diabetes, heart disease, and stroke. Help with weight loss. What are tips for following this plan? Reading food labels Check food labels for the amount of salt (sodium) per serving. Choose  foods with less than 5 percent of the Daily Value of sodium. Generally, foods with less than 300 milligrams (mg) of sodium per serving fit into this eating plan. To find whole grains, look  for the word "whole" as the first word in the ingredient list. Shopping Buy products labeled as "low-sodium" or "no salt added." Buy fresh foods. Avoid canned foods and pre-made or frozen meals. Cooking Avoid adding salt when cooking. Use salt-free seasonings or herbs instead of table salt or sea salt. Check with your health care provider or pharmacist before using salt substitutes. Do not fry foods. Cook foods using healthy methods such as baking, boiling, grilling, roasting, and broiling instead. Cook with heart-healthy oils, such as olive, canola, avocado, soybean, or sunflower oil. Meal planning  Eat a balanced diet that includes: 4 or more servings of fruits and 4 or more servings of vegetables each day. Try to fill one-half of your plate with fruits and vegetables. 6-8 servings of whole grains each day. Less than 6 oz (170 g) of lean meat, poultry, or fish each day. A 3-oz (85-g) serving of meat is about the same size as a deck of cards. One egg equals 1 oz (28 g). 2-3 servings of low-fat dairy each day. One serving is 1 cup (237 mL). 1 serving of nuts, seeds, or beans 5 times each week. 2-3 servings of heart-healthy fats. Healthy fats called omega-3 fatty acids are found in foods such as walnuts, flaxseeds, fortified milks, and eggs. These fats are also found in cold-water fish, such as sardines, salmon, and mackerel. Limit how much you eat of: Canned or prepackaged foods. Food that is high in trans fat, such as some fried foods. Food that is high in saturated fat, such as fatty meat. Desserts and other sweets, sugary drinks, and other foods with added sugar. Full-fat dairy products. Do not salt foods before eating. Do not eat more than 4 egg yolks a week. Try to eat at least 2 vegetarian meals a week. Eat more home-cooked food and less restaurant, buffet, and fast food. Lifestyle When eating at a restaurant, ask that your food be prepared with less salt or no salt, if  possible. If you drink alcohol: Limit how much you use to: 0-1 drink a day for women who are not pregnant. 0-2 drinks a day for men. Be aware of how much alcohol is in your drink. In the [place] one drink equals one 12 oz bottle of beer (355 mL), one 5 oz glass of wine (148 mL), or one 1 oz glass of hard liquor (44 mL). General information Avoid eating more than 2,300 mg of salt a day. If you have hypertension, you may need to reduce your sodium intake to 1,500 mg a day. Work with your health care provider to maintain a healthy body weight or to lose weight. Ask what an ideal weight is for you. Get at least 30 minutes of exercise that causes your heart to beat faster (aerobic exercise) most days of the week. Activities may include walking, swimming, or biking. Work with your health care provider or dietitian to adjust your eating plan to your individual calorie needs. What foods should I eat? Fruits All fresh, dried, or frozen fruit. Canned fruit in natural juice (without added sugar). Vegetables Fresh or frozen vegetables (raw, steamed, roasted, or grilled). Low-sodium or reduced-sodium tomato and vegetable juice. Low-sodium or reduced-sodium tomato sauce and tomato paste. Low-sodium or reduced-sodium canned vegetables. Grains Whole-grain or whole-wheat bread. Whole-grain or  whole-wheat pasta. Brown rice. [place]. Bulgur. Whole-grain and low-sodium cereals. Pita bread. Low-fat, low-sodium crackers. Whole-wheat flour tortillas. Meats and other proteins Skinless chicken or [place]. Ground chicken or [place]. Pork with fat trimmed off. Fish and seafood. Egg whites. Dried beans, peas, or lentils. Unsalted nuts, nut butters, and seeds. Unsalted canned beans. Lean cuts of beef with fat trimmed off. Low-sodium, lean precooked or cured meat, such as sausages or meat loaves. Dairy Low-fat (1%) or fat-free (skim) milk. Reduced-fat, low-fat, or fat-free cheeses. Nonfat, low-sodium ricotta or cottage  cheese. Low-fat or nonfat yogurt. Low-fat, low-sodium cheese. Fats and oils Soft margarine without trans fats. Vegetable oil. Reduced-fat, low-fat, or light mayonnaise and salad dressings (reduced-sodium). Canola, safflower, olive, avocado, soybean, and sunflower oils. Avocado. Seasonings and condiments Herbs. Spices. Seasoning mixes without salt. Other foods Unsalted popcorn and pretzels. Fat-free sweets. The items listed above may not be a complete list of foods and beverages you can eat. Contact a dietitian for more information. What foods should I avoid? Fruits Canned fruit in a light or heavy syrup. Fried fruit. Fruit in cream or butter sauce. Vegetables Creamed or fried vegetables. Vegetables in a cheese sauce. Regular canned vegetables (not low-sodium or reduced-sodium). Regular canned tomato sauce and paste (not low-sodium or reduced-sodium). Regular tomato and vegetable juice (not low-sodium or reduced-sodium). Angie Fava. Olives. Grains Baked goods made with fat, such as croissants, muffins, or some breads. Dry pasta or rice meal packs. Meats and other proteins Fatty cuts of meat. Ribs. Fried meat. Berniece Salines. Bologna, salami, and other precooked or cured meats, such as sausages or meat loaves. Fat from the back of a pig (fatback). Bratwurst. Salted nuts and seeds. Canned beans with added salt. Canned or smoked fish. Whole eggs or egg yolks. Chicken or [place] with skin. Dairy Whole or 2% milk, cream, and half-and-half. Whole or full-fat cream cheese. Whole-fat or sweetened yogurt. Full-fat cheese. Nondairy creamers. Whipped toppings. Processed cheese and cheese spreads. Fats and oils Butter. Stick margarine. Lard. Shortening. Ghee. Bacon fat. Tropical oils, such as coconut, palm kernel, or palm oil. Seasonings and condiments Onion salt, garlic salt, seasoned salt, table salt, and sea salt. Worcestershire sauce. Tartar sauce. Barbecue sauce. Teriyaki sauce. Soy sauce, including reduced-sodium.  Steak sauce. Canned and packaged gravies. Fish sauce. Oyster sauce. Cocktail sauce. Store-bought horseradish. Ketchup. Mustard. Meat flavorings and tenderizers. Bouillon cubes. Hot sauces. Pre-made or packaged marinades. Pre-made or packaged taco seasonings. Relishes. Regular salad dressings. Other foods Salted popcorn and pretzels. The items listed above may not be a complete list of foods and beverages you should avoid. Contact a dietitian for more information. Where to find more information National Heart, Lung, and Blood Institute: https://wilson-eaton.com/ American Heart Association: www.heart.org Academy of Nutrition and Dietetics: www.eatright.[employer]: www.kidney.org Summary The DASH eating plan is a healthy eating plan that has been shown to reduce high blood pressure (hypertension). It may also reduce your risk for type 2 diabetes, heart disease, and stroke. When on the DASH eating plan, aim to eat more fresh fruits and vegetables, whole grains, lean proteins, low-fat dairy, and heart-healthy fats. With the DASH eating plan, you should limit salt (sodium) intake to 2,300 mg a day. If you have hypertension, you may need to reduce your sodium intake to 1,500 mg a day. Work with your health care provider or dietitian to adjust your eating plan to your individual calorie needs. This information is not intended to replace advice given to you by your health care provider. Make sure  you discuss any questions you have with your health care provider. Document Revised: 03/21/2019 Document Reviewed: 03/21/2019 Elsevier Patient Education  [place].

## 2022-07-07 LAB — COMPREHENSIVE METABOLIC PANEL
ALT: 16 IU/L (ref 0–44)
AST: 17 IU/L (ref 0–40)
Albumin/Globulin Ratio: 1.9 (ref 1.2–2.2)
Albumin: 4.6 g/dL (ref 3.8–4.9)
Alkaline Phosphatase: 73 IU/L (ref 44–121)
BUN/Creatinine Ratio: 13 (ref 9–20)
BUN: 14 mg/dL (ref 6–24)
Bilirubin Total: 1.6 mg/dL — ABNORMAL HIGH (ref 0.0–1.2)
CO2: 23 mmol/L (ref 20–29)
Calcium: 10 mg/dL (ref 8.7–10.2)
Chloride: 104 mmol/L (ref 96–106)
Creatinine, Ser: 1.11 mg/dL (ref 0.76–1.27)
Globulin, Total: 2.4 g/dL (ref 1.5–4.5)
Glucose: 94 mg/dL (ref 70–99)
Potassium: 3.8 mmol/L (ref 3.5–5.2)
Sodium: 143 mmol/L (ref 134–144)
Total Protein: 7 g/dL (ref 6.0–8.5)
eGFR: 79 mL/min/{1.73_m2} (ref >59)

## 2022-07-07 LAB — CBC WITH DIFFERENTIAL/PLATELET
Basophils Absolute: 0.1 10*3/uL (ref 0.0–0.2)
Basos: 1 %
EOS (ABSOLUTE): 0.1 10*3/uL (ref 0.0–0.4)
Eos: 2 %
Hematocrit: 49.4 % (ref 37.5–51.0)
Hemoglobin: 16.6 g/dL (ref 13.0–17.7)
Immature Grans (Abs): 0.1 10*3/uL (ref 0.0–0.1)
Immature Granulocytes: 1 %
Lymphocytes Absolute: 1.9 10*3/uL (ref 0.7–3.1)
Lymphs: 27 %
MCH: 30.5 pg (ref 26.6–33.0)
MCHC: 33.6 g/dL (ref 31.5–35.7)
MCV: 91 fL (ref 79–97)
Monocytes Absolute: 0.7 10*3/uL (ref 0.1–0.9)
Monocytes: 10 %
Neutrophils Absolute: 4 10*3/uL (ref 1.4–7.0)
Neutrophils: 59 %
Platelets: 282 10*3/uL (ref 150–450)
RBC: 5.44 x10E6/uL (ref 4.14–5.80)
RDW: 13.1 % (ref 11.6–15.4)
WBC: 6.8 10*3/uL (ref 3.4–10.8)

## 2022-07-07 LAB — LIPID PANEL
Chol/HDL Ratio: 2 ratio (ref 0.0–5.0)
Cholesterol, Total: 136 mg/dL (ref 100–199)
HDL: 67 mg/dL (ref >39)
LDL Chol Calc (NIH): 42 mg/dL (ref 0–99)
Triglycerides: 169 mg/dL — ABNORMAL HIGH (ref 0–149)
VLDL Cholesterol Cal: 27 mg/dL (ref 5–40)

## 2022-07-11 ENCOUNTER — Other Ambulatory Visit: Payer: Self-pay

## 2022-07-11 MED ORDER — AMOXICILLIN 500 MG PO CAPS
500.0000 mg | ORAL_CAPSULE | Freq: Three times a day (TID) | ORAL | 0 refills | Status: DC
  Filled 2022-07-11: qty 21, 7d supply, fill #0

## 2022-07-13 ENCOUNTER — Other Ambulatory Visit: Payer: Self-pay

## 2022-07-19 ENCOUNTER — Other Ambulatory Visit: Payer: Self-pay

## 2022-07-26 ENCOUNTER — Other Ambulatory Visit: Payer: Self-pay

## 2022-07-26 ENCOUNTER — Emergency Department
Admission: EM | Admit: 2022-07-26 | Discharge: 2022-07-26 | Disposition: A | Discharge status: Home / Self Care | Payer: Self-pay | Attending: Emergency Medicine | Admitting: Emergency Medicine

## 2022-07-26 ENCOUNTER — Emergency Department: Payer: Self-pay

## 2022-07-26 DIAGNOSIS — I10 Essential (primary) hypertension: Secondary | ICD-10-CM | POA: Insufficient documentation

## 2022-07-26 DIAGNOSIS — E119 Type 2 diabetes mellitus without complications: Secondary | ICD-10-CM | POA: Insufficient documentation

## 2022-07-26 DIAGNOSIS — X500XXA Overexertion from strenuous movement or load, initial encounter: Secondary | ICD-10-CM | POA: Insufficient documentation

## 2022-07-26 DIAGNOSIS — J449 Chronic obstructive pulmonary disease, unspecified: Secondary | ICD-10-CM | POA: Insufficient documentation

## 2022-07-26 DIAGNOSIS — M545 Low back pain, unspecified: Secondary | ICD-10-CM | POA: Insufficient documentation

## 2022-07-26 MED ORDER — MELOXICAM 15 MG PO TABS
15.0000 mg | ORAL_TABLET | Freq: Every day | ORAL | 2 refills | Status: AC
  Filled 2022-07-26: qty 30, 30d supply, fill #0
  Filled 2022-08-30: qty 30, 30d supply, fill #1

## 2022-07-26 MED ORDER — METHOCARBAMOL 500 MG PO TABS
500.0000 mg | ORAL_TABLET | Freq: Four times a day (QID) | ORAL | 0 refills | Status: DC
  Filled 2022-07-26: qty 30, 8d supply, fill #0

## 2022-07-26 NOTE — ED Triage Notes (Signed)
Lower back pain after bending over and picking up some heavy boxes; No history of any back problems, no loss of bladder or bowel control, Denies numbness/tingling to lower extremities; Pain increases with movement

## 2022-07-26 NOTE — ED Provider Notes (Signed)
[facility] Provider Note    Event Date/Time   First MD Initiated Contact with Patient 07/26/22 1206     (approximate)   History   Back Pain (Lower back pain after bending over and picking up some heavy boxes; No history of any back problems, no loss of bladder or bowel control, Denies numbness/tingling to lower extremities; Pain increases with movement)   HPI  Christopher Harrell is a 52 y.o. male with history of hypertension, COPD, back pain, diabetes and as listed in EMR presents to the emergency department for treatment and evaluation of low back pain after bending over and picking up some heavy boxes while at work.  No numbness or tingling to the lower extremities.  Pain increases with movement..    Physical Exam   Triage Vital Signs: ED Triage Vitals  Enc Vitals Group     BP 07/26/22 1119 (!) 130/105     Pulse Rate 07/26/22 1119 88     Resp 07/26/22 1119 16     Temp 07/26/22 1119 98.4 F (36.9 C)     Temp Source 07/26/22 1119 Oral     SpO2 07/26/22 1119 97 %     Weight 07/26/22 1119 175 lb (79.4 kg)     Height 07/26/22 1119 5\' 9"  (1.753 m)     Head Circumference --      Peak Flow --      Pain Score 07/26/22 1115 10     Pain Loc --      Pain Edu? --      Excl. in [place]? --     Most recent vital signs: Vitals:   07/26/22 1119  BP: (!) 130/105  Pulse: 88  Resp: 16  Temp: 98.4 F (36.9 C)  SpO2: 97%    General: Awake, no distress.  CV:  Good peripheral perfusion.  Resp:  Normal effort.  Abd:  No distention.  Other:  No focal midline tenderness of lumbar spine. Paravertebral tenderness bilaterally on exam.   ED Results / Procedures / Treatments   Labs (all labs ordered are listed, but only abnormal results are displayed) Labs Reviewed - No data to display   EKG  Not indicated.   RADIOLOGY  Image and radiology report reviewed and interpreted by me. Radiology report consistent with the same.  Image of the lumbar spine shows  facet degenerative changes without acute bony abnormality.  PROCEDURES:  Critical Care performed: No  Procedures   MEDICATIONS ORDERED IN ED:  Medications - No data to display   IMPRESSION / MDM / Dawn / ED COURSE   I have reviewed the triage note.  Differential diagnosis includes, but is not limited to, back strain, vertebral fracture, disc disease  Patient's presentation is most consistent with acute illness / injury with system symptoms.  52 year old male presenting to the emergency department for treatment and evaluation of back pain that occurred while at work today.  See HPI for further details.  X-ray is without acute findings.  He does have some degenerative changes.  Plan will be to send him home with a prescription for meloxicam and robaxin.      FINAL CLINICAL IMPRESSION(S) / ED DIAGNOSES   Final diagnoses:  Acute bilateral low back pain without sciatica     Rx / DC Orders   ED Discharge Orders          Ordered    methocarbamol (ROBAXIN) 500 MG tablet  4 times daily  07/26/22 1320    meloxicam (MOBIC) 15 MG tablet  Daily        07/26/22 1320             Note:  This document was prepared using Dragon voice recognition software and may include unintentional dictation errors.   Victorino Dike, FNP 07/26/22 1557    Duffy Bruce, MD 07/27/22 1104

## 2022-08-03 ENCOUNTER — Other Ambulatory Visit: Payer: Self-pay

## 2022-08-03 ENCOUNTER — Encounter: Payer: Self-pay | Admitting: Gerontology

## 2022-08-03 ENCOUNTER — Ambulatory Visit: Payer: Self-pay | Admitting: Gerontology

## 2022-08-03 VITALS — BP 167/103 | HR 87 | Temp 98.0°F | Resp 16 | Ht 69.0 in | Wt 178.7 lb

## 2022-08-03 DIAGNOSIS — IMO0001 Reserved for inherently not codable concepts without codable children: Secondary | ICD-10-CM

## 2022-08-03 DIAGNOSIS — G8929 Other chronic pain: Secondary | ICD-10-CM

## 2022-08-03 DIAGNOSIS — F17200 Nicotine dependence, unspecified, uncomplicated: Secondary | ICD-10-CM

## 2022-08-03 DIAGNOSIS — I10 Essential (primary) hypertension: Secondary | ICD-10-CM

## 2022-08-03 DIAGNOSIS — M545 Low back pain, unspecified: Secondary | ICD-10-CM

## 2022-08-03 DIAGNOSIS — K219 Gastro-esophageal reflux disease without esophagitis: Secondary | ICD-10-CM

## 2022-08-03 MED ORDER — PANTOPRAZOLE SODIUM 20 MG PO TBEC
20.0000 mg | DELAYED_RELEASE_TABLET | Freq: Every day | ORAL | 0 refills | Status: DC
  Filled 2022-08-03: qty 30, 30d supply, fill #0

## 2022-08-03 NOTE — Progress Notes (Signed)
Established Patient Office Visit  Subjective   Patient ID: Christopher Harrell, male    DOB: 1970-05-19  Age: 52 y.o. MRN: [id number]  Chief Complaint  Patient presents with   Follow-up    Labs drawn 07/06/22   Back Pain    Patient c/o back pain after lifting something heavy at work on 07/26/22. Patient seen at [facility] ED for back pain on the same day.   Leg Pain    Patient c/o bilateral leg and ankle pain x 6-8 months. Patient states it is worse at night when he tries to get up and go the bathroom.   chest congestion    Patient c/o chest congestion without a lot of cough. Patient states he has a lot of mucous.    Back Pain Associated symptoms include leg pain.  Leg Pain    Christopher Harrell is a 52 y/o AA male who is seen for routine follow-up of hypertension, hyperlipidemia, tobacco use disorder and COPD who presents today for follow up visit, medication refill and lab review. His blood pressure is elevated today 167/103. Recheck 146/104. States he takes his medications as prescribed. He states he checks his blood pressure at home but has not check it this week. it remains constantly high. States he eats out a lot, salty and fried foods. Denies any headaches, dizziness, blurry vision, nausea or vomiting. Patient is complaining today of increased back, leg and ankle pain which is more intense at night when he gets up to use the bathroom. Rates the back pain as 8 and leg pain at 7. The pain is worst at rest. He describes it as constant sharp pain that radiates down to his legs. He also reports having low energy, not been able to work for 1 week, and its affecting his sleeping pattern. He reports constant lifting and bending at work which aggravates his pain. He denies any numbness or tingling., bladder/bowel incontinence. Had a recent Ed visit on 07/25/21 due to the back pain- He was given a prescription for meloxicam and robaxin which he is currently taking. States he had some relieved with  them but  still has pain. Lumbar x ray done showed Facet degenerative changes lower lumbar spine.   No acute osseous abnormalities. He also had recent dental exam and he prescribed antibiotic for tooth abscess. He has return visit with dentist in May. Patient states his tooth feels better. Overall, he states that he's doing well and offer no further complaint   Patient Active Problem List   Diagnosis Date Noted   Cracked tooth 07/06/2022   Back pain 04/04/2022   Diabetes mellitus without complication [id number]   Colon cancer screening    Polyp of descending colon    Vitamin D deficiency 12/09/2020   Chronic midline low back pain with bilateral sciatica 06/10/2020   Back pain 05/13/2020   Essential hypertension 08/13/2019   Erectile dysfunction 06/25/2019   Acute flank pain 06/25/2019   Tooth abscess 04/30/2019   Smoking 04/30/2019   Hypertriglyceridemia 06/05/2017   COPD with acute exacerbation 10/14/2016   Pneumonia 10/14/2016   Hypertensive urgency, malignant 06/13/2016   Tobacco abuse 06/13/2016   Community acquired pneumonia 01/23/2016   Elevated troponin 01/23/2016   HTN (hypertension) 01/23/2016   Chest pain 01/23/2016   Past Medical History:  Diagnosis Date   Community acquired pneumonia    a. Dx 01/21/2016 - LLL PNA.   ED (erectile dysfunction)    Essential hypertension    Hyperlipidemia    Tobacco  abuse    Past Surgical History:  Procedure Laterality Date   COLONOSCOPY WITH PROPOFOL N/A 04/05/2021   Procedure: COLONOSCOPY WITH PROPOFOL;  Surgeon: Lucilla Lame, MD;  Location: [facility] ENDOSCOPY;  Service: Endoscopy;  Laterality: N/A;   Social History   Tobacco Use   Smoking status: Every Day    Packs/day: 0.50    Years: 20.00    Additional pack years: 0.00    Total pack years: 10.00    Types: Cigarettes   Smokeless tobacco: Never   Tobacco comments:    Tried gum.  Vaping Use   Vaping Use: Never used  Substance Use Topics   Alcohol use: Yes    Alcohol/week: 4.0  standard drinks of alcohol    Types: 2 Glasses of wine, 2 Cans of beer per week    Comment: 1 beer and wine twice per week   Drug use: No   Family History  Problem Relation Age of Onset   Hypertension Mother    Lung cancer Mother    Hyperlipidemia Mother    Heart attack Father 2   Heart attack Maternal Grandmother    Other Maternal Grandfather        unknown medical history   Other Paternal Grandmother        unknown medical history   Other Paternal Grandfather        unknown medical history   No Known Allergies    Review of Systems  Constitutional: Negative.   HENT: Negative.    Eyes: Negative.   Respiratory: Negative.    Cardiovascular: Negative.   Gastrointestinal: Negative.   Genitourinary: Negative.   Musculoskeletal:  Positive for back pain (constant chronic back and leg pain).  Neurological: Negative.   Psychiatric/Behavioral:  The patient has insomnia (states he has not been sleeping well due to the back and leg pain).       Objective:     BP (!) 167/103 (BP Location: Left Arm, Patient Position: Sitting, Cuff Size: Large)   Pulse 87   Temp 98 F (36.7 C) (Oral)   Resp 16   Ht 5\' 9"  (1.753 m)   Wt 178 lb 11.2 oz (81.1 kg)   SpO2 98%   BMI 26.39 kg/m  BP Readings from Last 3 Encounters:  08/03/22 (!) 167/103  07/26/22 (!) 130/105  07/06/22 119/87   Wt Readings from Last 3 Encounters:  08/03/22 178 lb 11.2 oz (81.1 kg)  07/26/22 175 lb (79.4 kg)  07/06/22 175 lb 1.6 oz (79.4 kg)   SpO2 Readings from Last 3 Encounters:  08/03/22 98%  07/26/22 97%  07/06/22 94%      Physical Exam Constitutional:      Appearance: Normal appearance. He is normal weight.  HENT:     Head: Normocephalic and atraumatic.     Right Ear: External ear normal.     Left Ear: External ear normal.     Nose: Nose normal.     Mouth/Throat:     Mouth: Mucous membranes are moist.     Pharynx: Oropharynx is clear.  Eyes:     Extraocular Movements: Extraocular movements  intact.     Conjunctiva/sclera: Conjunctivae normal.     Pupils: Pupils are equal, round, and reactive to light.  Cardiovascular:     Rate and Rhythm: Normal rate and regular rhythm.     Pulses: Normal pulses.     Heart sounds: Normal heart sounds.  Pulmonary:     Effort: Pulmonary effort is normal.  Breath sounds: Normal breath sounds.  Abdominal:     General: Abdomen is flat. Bowel sounds are normal.     Palpations: Abdomen is soft.  Musculoskeletal:        General: Normal range of motion.     Cervical back: Normal range of motion.  Skin:    General: Skin is warm and dry.     Capillary Refill: Capillary refill takes less than 2 seconds.  Neurological:     General: No focal deficit present.     Mental Status: He is alert and oriented to person, place, and time. Mental status is at baseline.  Psychiatric:        Mood and Affect: Mood normal.        Behavior: Behavior normal.        Thought Content: Thought content normal.        Judgment: Judgment normal.      No results found for any visits on 08/03/22.  Last CBC Lab Results  Component Value Date   WBC 6.8 07/06/2022   HGB 16.6 07/06/2022   HCT 49.4 07/06/2022   MCV 91 07/06/2022   MCH 30.5 07/06/2022   RDW 13.1 07/06/2022   PLT 282 [id number]   Last metabolic panel Lab Results  Component Value Date   GLUCOSE 94 07/06/2022   NA 143 07/06/2022   K 3.8 07/06/2022   CL 104 07/06/2022   CO2 23 07/06/2022   BUN 14 07/06/2022   CREATININE 1.11 07/06/2022   EGFR 79 07/06/2022   CALCIUM 10.0 07/06/2022   PROT 7.0 07/06/2022   ALBUMIN 4.6 07/06/2022   LABGLOB 2.4 07/06/2022   AGRATIO 1.9 07/06/2022   BILITOT 1.6 (H) 07/06/2022   ALKPHOS 73 07/06/2022   AST 17 07/06/2022   ALT 16 07/06/2022   ANIONGAP 8 05/31/2021   Last lipids Lab Results  Component Value Date   CHOL 136 07/06/2022   HDL 67 07/06/2022   LDLCALC 42 07/06/2022   LDLDIRECT 138 (H) 06/13/2018   TRIG 169 (H) 07/06/2022   CHOLHDL 2.0  07/06/2022   Last hemoglobin A1c Lab Results  Component Value Date   HGBA1C 5.6 04/04/2022   Last thyroid functions Lab Results  Component Value Date   TSH 1.110 06/13/2016   Last vitamin D Lab Results  Component Value Date   VD25OH 26.4 (L) 12/01/2020   Last vitamin B12 and Folate No results found for: "VITAMINB12", "FOLATE"    The 10-year ASCVD risk score (Arnett DK, et al., 2019) is: 37.7%    Assessment & Plan:  1. Essential hypertension His blood remains uncontrolled, his goal should be less than 140/90. BP 167/103 recheck 146/104. Patient encouraged taking medication as prescribed, DASH diet and exercising daily as tolerated. Also encouraged to check blood pressure daily, log and bring readings to next appointment.   2. Smoking Advised patient on smoking cessation and the need to cut back.  3. Chronic bilateral low back pain, unspecified whether sciatica present He was advised to continue on current medication . He will follow up with [facility] Dr Jefm Bryant and was advised to call clinic or go to ED if symptoms do not improve or worsen.    4. Gastroesophageal reflux disease without esophagitis Continue taking protonix for GERD -Avoid spicy, fatty and fried food -Avoid sodas and sour juices -Avoid heavy meals -Avoid eating 4 hours before bedtime -Elevate head of bed at night - pantoprazole (PROTONIX) 20 MG tablet; Take 1 tablet (20 mg total) by mouth daily.  Dispense: 30 tablet; Refill: 0     Return in about 4 weeks (around 08/31/2022), or if symptoms worsen or fail to improve.    Chioma Jerold Coombe, NP

## 2022-08-03 NOTE — Patient Instructions (Signed)

## 2022-08-08 ENCOUNTER — Ambulatory Visit: Payer: Self-pay

## 2022-08-18 ENCOUNTER — Other Ambulatory Visit: Payer: Self-pay

## 2022-08-30 ENCOUNTER — Other Ambulatory Visit: Payer: Self-pay

## 2022-08-31 ENCOUNTER — Ambulatory Visit: Payer: Self-pay | Admitting: Gerontology

## 2022-09-01 ENCOUNTER — Other Ambulatory Visit: Payer: Self-pay

## 2022-09-14 ENCOUNTER — Other Ambulatory Visit: Payer: Self-pay

## 2022-10-03 ENCOUNTER — Ambulatory Visit: Payer: Self-pay | Admitting: Rheumatology

## 2022-10-03 ENCOUNTER — Other Ambulatory Visit: Payer: Self-pay

## 2022-11-07 ENCOUNTER — Ambulatory Visit: Payer: Self-pay | Admitting: Rheumatology

## 2022-11-10 ENCOUNTER — Other Ambulatory Visit: Payer: Self-pay | Admitting: Urology

## 2022-11-10 DIAGNOSIS — N529 Male erectile dysfunction, unspecified: Secondary | ICD-10-CM

## 2022-11-10 DIAGNOSIS — R351 Nocturia: Secondary | ICD-10-CM

## 2022-11-13 ENCOUNTER — Other Ambulatory Visit: Payer: Self-pay

## 2022-11-13 ENCOUNTER — Encounter: Payer: Self-pay | Admitting: Urology

## 2022-11-15 NOTE — Progress Notes (Unsigned)
11/15/21 3:28 PM   Christopher Harrell 1971/01/14 [id number]  Referring provider:  Rolm Gala, NP [address] [place],  [place] [address]  Urological history  1. ED -contributing factors of age, HTN, HLD, diabetes and smoking -testosterone level 306 03/2021 -managed with sildenafil 100 mg, on-demand-dosing   2. BPH w/ LUTS -PSA pending  -Tamsulosin 0.4 mg daily  HPI: Christopher Harrell is a 52 y.o.male who presents today for a 1 year follow-up with I PSS and SHIM.   I PSS ***   IPSS     Row Name 11/15/21 1500         International Prostate Symptom Score   How often have you had the sensation of not emptying your bladder? Not at All     How often have you had to urinate less than every two hours? Less than half the time     How often have you found you stopped and started again several times when you urinated? More than half the time     How often have you found it difficult to postpone urination? Not at All     How often have you had a weak urinary stream? Not at All     How often have you had to strain to start urination? Not at All     How many times did you typically get up at night to urinate? 3 Times     Total IPSS Score 9       Quality of Life due to urinary symptoms   If you were to spend the rest of your life with your urinary condition just the way it is now how would you feel about that? Mostly Satisfied               Score:  1-7 Mild 8-19 Moderate 20-35 Severe   SHIM ***   SHIM     Row Name 11/15/21 1510         SHIM: Over the last 6 months:   How do you rate your confidence that you could get and keep an erection? High     When you had erections with sexual stimulation, how often were your erections hard enough for penetration (entering your partner)? Most Times (much more than half the time)     During sexual intercourse, how often were you able to maintain your erection after you had penetrated (entered) your partner? Most  Times (much more than half the time)     During sexual intercourse, how difficult was it to maintain your erection to completion of intercourse? Slightly Difficult     When you attempted sexual intercourse, how often was it satisfactory for you? Most Times (much more than half the time)       SHIM Total Score   SHIM 20              Score: 1-7 Severe ED 8-11 Moderate ED 12-16 Mild-Moderate ED 17-21 Mild ED 22-25 No ED    PMH: Past Medical History:  Diagnosis Date   Community acquired pneumonia    a. Dx 01/21/2016 - LLL PNA.   ED (erectile dysfunction)    Essential hypertension    Hyperlipidemia    Tobacco abuse     Surgical History: Past Surgical History:  Procedure Laterality Date   COLONOSCOPY WITH PROPOFOL N/A 04/05/2021   Procedure: COLONOSCOPY WITH PROPOFOL;  Surgeon: Midge Minium, MD;  Location: [facility] ENDOSCOPY;  Service: Endoscopy;  Laterality: N/A;   NO PAST  SURGERIES      Home Medications:  Allergies as of 11/15/2021   No Known Allergies      Medication List        Accurate as of November 15, 2021  3:28 PM. If you have any questions, ask your nurse or doctor.          albuterol 108 (90 Base) MCG/ACT inhaler Commonly known as: VENTOLIN HFA Inhale 2 puffs into the lungs every 6 (six) hours as needed for wheezing or shortness of breath.   amLODipine 10 MG tablet Commonly known as: NORVASC Take 1 tablet (10 mg total) by mouth daily.   aspirin EC 81 MG tablet Take 1 tablet (81 mg total) by mouth daily.   atenolol 50 MG tablet Commonly known as: Tenormin Take 1 tablet (50 mg total) by mouth daily.   atorvastatin 10 MG tablet Commonly known as: LIPITOR Take 1 tablet (10 mg total) by mouth daily.   meloxicam 7.5 MG tablet Commonly known as: Mobic Take 1 tablet (7.5 mg total) by mouth daily.   multivitamin with minerals Tabs tablet Take 1 tablet by mouth daily.   pantoprazole 20 MG tablet Commonly known as: Protonix Take 1 tablet (20 mg  total) by mouth daily.   sildenafil 100 MG tablet Commonly known as: VIAGRA Take 1 tablet (100 mg total) by mouth daily as needed for erectile dysfunction.   tadalafil 5 MG tablet Commonly known as: CIALIS Take 5-10 mg by mouth daily as needed.   tamsulosin 0.4 MG Caps capsule Commonly known as: FLOMAX Take 1 capsule (0.4 mg total) by mouth daily.   triamterene-hydrochlorothiazide 37.5-25 MG capsule Commonly known as: DYAZIDE Take 1 each (1 capsule total) by mouth daily.        Allergies:  No Known Allergies  Family History: Family History  Problem Relation Age of Onset   Hypertension Mother    Lung cancer Mother    Hyperlipidemia Mother    Heart attack Father 46   Heart attack Maternal Grandmother     Social History:  reports that he has been smoking cigarettes. He has a 10.00 pack-year smoking history. He has never used smokeless tobacco. He reports that he does not currently use alcohol. He reports that he does not use drugs.   Physical Exam: BP (!) 159/108   Pulse 66   Ht 5\' 9"  (1.753 m)   Wt 219 lb (99.3 kg)   BMI 32.34 kg/m   Constitutional:  Well nourished. Alert and oriented, No acute distress. HEENT: [place] AT, moist mucus membranes.  Trachea midline Cardiovascular: No clubbing, cyanosis, or edema. Respiratory: Normal respiratory effort, no increased work of breathing. GU: No CVA tenderness.  No bladder fullness or masses.  Patient with circumcised/uncircumcised phallus. ***Foreskin easily retracted***  Urethral meatus is patent.  No penile discharge. No penile lesions or rashes. Scrotum without lesions, cysts, rashes and/or edema.  Testicles are located scrotally bilaterally. No masses are appreciated in the testicles. Left and right epididymis are normal. Rectal: Patient with  normal sphincter tone. Anus and perineum without scarring or rashes. No rectal masses are appreciated. Prostate is approximately *** grams, *** nodules are appreciated. Seminal vesicles  are normal. Neurologic: Grossly intact, no focal deficits, moving all 4 extremities. Psychiatric: Normal mood and affect.   Laboratory Data: Lipid Panel     Component Value Date/Time   CHOL 136 07/06/2022 1419   TRIG 169 (H) 07/06/2022 1419   HDL 67 07/06/2022 1419   CHOLHDL 2.0 07/06/2022 1419  LDLCALC 42 07/06/2022 1419   LDLDIRECT 138 (H) 06/13/2018 1836   LABVLDL 27 07/06/2022 1419    CMP     Component Value Date/Time   NA 143 07/06/2022 1419   K 3.8 07/06/2022 1419   CL 104 07/06/2022 1419   CO2 23 07/06/2022 1419   GLUCOSE 94 07/06/2022 1419   GLUCOSE 132 (H) 05/31/2021 0940   BUN 14 07/06/2022 1419   CREATININE 1.11 07/06/2022 1419   CALCIUM 10.0 07/06/2022 1419   PROT 7.0 07/06/2022 1419   ALBUMIN 4.6 07/06/2022 1419   AST 17 07/06/2022 1419   ALT 16 07/06/2022 1419   ALKPHOS 73 07/06/2022 1419   BILITOT 1.6 (H) 07/06/2022 1419   EGFR 79 07/06/2022 1419   GFRNONAA >60 05/31/2021 0940  I have reviewed the labs.     Pertinent Imaging: ***   Assessment & Plan:    1. Nocturia -Patient has seen a reduction in the nocturia from 3-4 to once a night with the tamsulosin -Continue tamsulosin 0.4 mg daily  2. ED -Good response with sildenafil 100 mg prior to intercourse -Continue sildenafil 100 mg, on-demand dosing   Return in about 1 year (around 11/16/2022) for IPSS, SHIM, PSA and exam.  Michiel Cowboy, PA-C   [facility] [address] [place], [place] [address] [phone]

## 2022-11-16 ENCOUNTER — Ambulatory Visit: Payer: Self-pay | Admitting: Urology

## 2022-11-16 DIAGNOSIS — R351 Nocturia: Secondary | ICD-10-CM

## 2022-11-16 DIAGNOSIS — N401 Enlarged prostate with lower urinary tract symptoms: Secondary | ICD-10-CM

## 2022-11-16 DIAGNOSIS — N529 Male erectile dysfunction, unspecified: Secondary | ICD-10-CM

## 2022-11-21 ENCOUNTER — Encounter: Payer: Self-pay | Admitting: Urology

## 2022-12-01 ENCOUNTER — Other Ambulatory Visit: Payer: Self-pay

## 2022-12-05 ENCOUNTER — Ambulatory Visit: Payer: Self-pay | Admitting: Rheumatology

## 2022-12-28 ENCOUNTER — Other Ambulatory Visit: Payer: Self-pay

## 2023-01-18 ENCOUNTER — Other Ambulatory Visit: Payer: Self-pay

## 2023-02-27 ENCOUNTER — Other Ambulatory Visit: Payer: Self-pay

## 2023-02-27 ENCOUNTER — Other Ambulatory Visit: Payer: Self-pay | Admitting: Gerontology

## 2023-02-27 DIAGNOSIS — R351 Nocturia: Secondary | ICD-10-CM

## 2023-03-01 ENCOUNTER — Other Ambulatory Visit: Payer: Self-pay

## 2023-04-12 ENCOUNTER — Ambulatory Visit: Payer: Self-pay | Admitting: Gerontology

## 2023-04-16 NOTE — Progress Notes (Unsigned)
04/16/23 9:28 PM   Christopher Harrell 03-18-1971 [id number]  Referring provider:  Rolm Gala, NP [address] [place],  [place] [address]  Urological history  1. ED -contributing factors of age, HTN, HLD, diabetes and smoking -testosterone level 306 03/2021 -managed with sildenafil 100 mg, on-demand-dosing   2. BPH w/ LUTS -PSA pending  -Tamsulosin 0.4 mg daily  HPI: Christopher Harrell is a 52 y.o.male who presents today for a 1 year follow-up.    Previous records reviewed.      I PSS ***       Score:  1-7 Mild 8-19 Moderate 20-35 Severe   SHIM ***     Score: 1-7 Severe ED 8-11 Moderate ED 12-16 Mild-Moderate ED 17-21 Mild ED 22-25 No ED    PMH: Past Medical History:  Diagnosis Date   Community acquired pneumonia    a. Dx 01/21/2016 - LLL PNA.   ED (erectile dysfunction)    Essential hypertension    Hyperlipidemia    Tobacco abuse     Surgical History: Past Surgical History:  Procedure Laterality Date   COLONOSCOPY WITH PROPOFOL N/A 04/05/2021   Procedure: COLONOSCOPY WITH PROPOFOL;  Surgeon: Midge Minium, MD;  Location: [facility] ENDOSCOPY;  Service: Endoscopy;  Laterality: N/A;    Home Medications:  Allergies as of 04/19/2023   No Known Allergies      Medication List        Accurate as of April 16, 2023  9:28 PM. If you have any questions, ask your nurse or doctor.          albuterol 108 (90 Base) MCG/ACT inhaler Commonly known as: VENTOLIN HFA Inhale 2 puffs into the lungs every 6 (six) hours as needed for wheezing or shortness of breath.   amLODipine 10 MG tablet Commonly known as: NORVASC Take 1 tablet (10 mg total) by mouth daily.   atenolol 25 MG tablet Commonly known as: TENORMIN   atorvastatin 10 MG tablet Commonly known as: LIPITOR Take 1 tablet (10 mg total) by mouth daily.   losartan 50 MG tablet Commonly known as: COZAAR Take 1 tablet (50 mg total) by mouth at bedtime.   meloxicam 15 MG  tablet Commonly known as: MOBIC Take 1 tablet (15 mg total) by mouth daily.   methocarbamol 500 MG tablet Commonly known as: ROBAXIN Take 1 tablet (500 mg total) by mouth 4 (four) times daily.   multivitamin with minerals Tabs tablet Take 1 tablet by mouth daily.   pantoprazole 20 MG tablet Commonly known as: PROTONIX Take 1 tablet (20 mg total) by mouth daily.   sildenafil 100 MG tablet Commonly known as: VIAGRA Take 1 tablet (100 mg total) by mouth daily as needed for erectile dysfunction.   tamsulosin 0.4 MG Caps capsule Commonly known as: FLOMAX Take 1 capsule (0.4 mg total) by mouth daily.   triamterene-hydrochlorothiazide 37.5-25 MG capsule Commonly known as: DYAZIDE Take 1 capsule by mouth daily.        Allergies:  No Known Allergies  Family History: Family History  Problem Relation Age of Onset   Hypertension Mother    Lung cancer Mother    Hyperlipidemia Mother    Heart attack Father 4   Heart attack Maternal Grandmother    Other Maternal Grandfather        unknown medical history   Other Paternal Grandmother        unknown medical history   Other Paternal Grandfather        unknown  medical history    Social History:  reports that he has been smoking cigarettes. He has a 10 pack-year smoking history. He has never used smokeless tobacco. He reports current alcohol use of about 4.0 standard drinks of alcohol per week. He reports that he does not use drugs.   Physical Exam: There were no vitals taken for this visit.  Constitutional:  Well nourished. Alert and oriented, No acute distress. HEENT: [place] AT, moist mucus membranes.  Trachea midline Cardiovascular: No clubbing, cyanosis, or edema. Respiratory: Normal respiratory effort, no increased work of breathing. GU: No CVA tenderness.  No bladder fullness or masses.  Patient with circumcised/uncircumcised phallus. ***Foreskin easily retracted***  Urethral meatus is patent.  No penile discharge. No penile  lesions or rashes. Scrotum without lesions, cysts, rashes and/or edema.  Testicles are located scrotally bilaterally. No masses are appreciated in the testicles. Left and right epididymis are normal. Rectal: Patient with  normal sphincter tone. Anus and perineum without scarring or rashes. No rectal masses are appreciated. Prostate is approximately *** grams, *** nodules are appreciated. Seminal vesicles are normal. Neurologic: Grossly intact, no focal deficits, moving all 4 extremities. Psychiatric: Normal mood and affect.   Laboratory Data: Lipid Panel     Component Value Date/Time   CHOL 136 07/06/2022 1419   TRIG 169 (H) 07/06/2022 1419   HDL 67 07/06/2022 1419   CHOLHDL 2.0 07/06/2022 1419   LDLCALC 42 07/06/2022 1419   LDLDIRECT 138 (H) 06/13/2018 1836   LABVLDL 27 07/06/2022 1419    CMP     Component Value Date/Time   NA 143 07/06/2022 1419   K 3.8 07/06/2022 1419   CL 104 07/06/2022 1419   CO2 23 07/06/2022 1419   GLUCOSE 94 07/06/2022 1419   GLUCOSE 132 (H) 05/31/2021 0940   BUN 14 07/06/2022 1419   CREATININE 1.11 07/06/2022 1419   CALCIUM 10.0 07/06/2022 1419   PROT 7.0 07/06/2022 1419   ALBUMIN 4.6 07/06/2022 1419   AST 17 07/06/2022 1419   ALT 16 07/06/2022 1419   ALKPHOS 73 07/06/2022 1419   BILITOT 1.6 (H) 07/06/2022 1419   EGFR 79 07/06/2022 1419   GFRNONAA >60 05/31/2021 0940  I have reviewed the labs.     Pertinent Imaging: ***   Assessment & Plan:    1. Nocturia -Patient has seen a reduction in the nocturia from 3-4 to once a night with the tamsulosin -Continue tamsulosin 0.4 mg daily  2. ED -Good response with sildenafil 100 mg prior to intercourse -Continue sildenafil 100 mg, on-demand dosing   No follow-ups on file.  Cloretta Ned   [facility] [address] [place], [place] [address] [phone]

## 2023-04-19 ENCOUNTER — Ambulatory Visit: Payer: Self-pay | Admitting: Urology

## 2023-05-10 ENCOUNTER — Ambulatory Visit: Payer: Self-pay | Admitting: Gerontology

## 2023-05-10 ENCOUNTER — Encounter: Payer: Self-pay | Admitting: Gerontology

## 2023-05-10 VITALS — BP 136/95 | HR 95 | Resp 18 | Ht 69.0 in | Wt 176.5 lb

## 2023-05-10 DIAGNOSIS — R351 Nocturia: Secondary | ICD-10-CM

## 2023-05-10 DIAGNOSIS — J42 Unspecified chronic bronchitis: Secondary | ICD-10-CM

## 2023-05-10 DIAGNOSIS — R3915 Urgency of urination: Secondary | ICD-10-CM

## 2023-05-10 DIAGNOSIS — K219 Gastro-esophageal reflux disease without esophagitis: Secondary | ICD-10-CM

## 2023-05-10 DIAGNOSIS — E559 Vitamin D deficiency, unspecified: Secondary | ICD-10-CM

## 2023-05-10 DIAGNOSIS — E782 Mixed hyperlipidemia: Secondary | ICD-10-CM

## 2023-05-10 DIAGNOSIS — Z0000 Encounter for general adult medical examination without abnormal findings: Secondary | ICD-10-CM

## 2023-05-10 DIAGNOSIS — I10 Essential (primary) hypertension: Secondary | ICD-10-CM

## 2023-05-10 MED ORDER — AMLODIPINE BESYLATE 10 MG PO TABS
10.0000 mg | ORAL_TABLET | Freq: Every day | ORAL | 1 refills | Status: DC
  Filled 2023-05-10 – 2023-08-10 (×2): qty 90, 90d supply, fill #0

## 2023-05-10 MED ORDER — TAMSULOSIN HCL 0.4 MG PO CAPS
0.4000 mg | ORAL_CAPSULE | Freq: Every day | ORAL | 3 refills | Status: DC
  Filled 2023-05-10 – 2023-08-10 (×2): qty 30, 30d supply, fill #0

## 2023-05-10 MED ORDER — TRIAMTERENE-HCTZ 37.5-25 MG PO CAPS
1.0000 | ORAL_CAPSULE | Freq: Every day | ORAL | 1 refills | Status: DC
  Filled 2023-05-10 – 2023-08-10 (×2): qty 90, 90d supply, fill #0

## 2023-05-10 MED ORDER — PANTOPRAZOLE SODIUM 20 MG PO TBEC
20.0000 mg | DELAYED_RELEASE_TABLET | Freq: Every day | ORAL | 0 refills | Status: DC
  Filled 2023-05-10 – 2023-08-10 (×2): qty 30, 30d supply, fill #0

## 2023-05-10 MED ORDER — ATORVASTATIN CALCIUM 10 MG PO TABS
10.0000 mg | ORAL_TABLET | Freq: Every day | ORAL | 1 refills | Status: DC
  Filled 2023-05-10 – 2023-08-10 (×2): qty 90, 90d supply, fill #0

## 2023-05-10 MED ORDER — LOSARTAN POTASSIUM 50 MG PO TABS
50.0000 mg | ORAL_TABLET | Freq: Every evening | ORAL | 1 refills | Status: DC
  Filled 2023-05-10 – 2023-08-10 (×2): qty 90, 90d supply, fill #0

## 2023-05-10 MED ORDER — ALBUTEROL SULFATE HFA 108 (90 BASE) MCG/ACT IN AERS
2.0000 | INHALATION_SPRAY | Freq: Four times a day (QID) | RESPIRATORY_TRACT | 1 refills | Status: DC | PRN
  Filled 2023-05-10: qty 6.7, 30d supply, fill #0

## 2023-05-10 NOTE — Progress Notes (Deleted)
 Established Patient Office Visit  Subjective   Patient ID: Christopher Harrell, male    DOB: 11/12/1970  Age: 53 y.o. MRN: [id number]  Chief Complaint  Patient presents with   Hypertension    HPI  Christopher Harrell is a 53 y/o AA male who is seen for routine follow-up of hypertension, hyperlipidemia, tobacco use disorder and COPD who presents today for follow up visit and medications refills. He is non compliant with his appointment.  He states  that he checks  his blood pressure twice a week at home, reading 170/135 mmHg to 150/120 mmHg. At today's office visit, Bp 156/104 mmHg, recheck 151/103 mmHg. He denies chest pain, palpitation, headache, and blurry vision. He  states he takes medications as prescribed. He smokes for 25 years with over 1/2 pack a day. He complaints urinary urgency, urinary difficulty, and urine leakage. He takes flomax  0.4 g daily. He complaints bilateral sciatic pain, from back down to legs and feet. He denies history of fall. Previous lumbar x ray done showed facet degenerative changes lower lumbar spine. Overall, he states that he's doing well and offer no further complaint.  Review of Systems  Constitutional: Negative.   HENT: Negative.    Eyes: Negative.   Respiratory: Negative.    Cardiovascular: Negative.   Gastrointestinal: Negative.   Genitourinary:  Positive for urgency.  Musculoskeletal:  Positive for back pain.  Skin: Negative.   Neurological: Negative.   Endo/Heme/Allergies: Negative.   Psychiatric/Behavioral: Negative.       Objective:     BP (!) 156/104 (BP Location: Right Arm)   Pulse 90   Ht 5\' 9"  (1.753 m)   Wt 176 lb 8 oz (80.1 kg)   SpO2 96%   BMI 26.06 kg/m  BP Readings from Last 3 Encounters:  05/10/23 (!) 151/103  08/03/22 (!) 167/103  07/26/22 (!) 130/105   Wt Readings from Last 3 Encounters:  05/10/23 176 lb 8 oz (80.1 kg)  08/03/22 178 lb 11.2 oz (81.1 kg)  07/26/22 175 lb (79.4 kg)      Physical Exam HENT:     Head:  Normocephalic.  Eyes:     Pupils: Pupils are equal, round, and reactive to light.  Cardiovascular:     Rate and Rhythm: Normal rate and regular rhythm.     Pulses: Normal pulses.     Heart sounds: Normal heart sounds.  Pulmonary:     Effort: Pulmonary effort is normal.     Breath sounds: Normal breath sounds.  Abdominal:     General: Abdomen is flat.     Palpations: Abdomen is soft.  Genitourinary:    Comments: Pt deferred to exam. Musculoskeletal:        General: Normal range of motion.     Cervical back: Normal range of motion.  Skin:    General: Skin is warm.  Neurological:     General: No focal deficit present.     Mental Status: He is alert and oriented to person, place, and time.  Psychiatric:        Mood and Affect: Mood normal.        Behavior: Behavior normal.    No results found for any visits on 05/10/23.  Last CBC Lab Results  Component Value Date   WBC 6.8 07/06/2022   HGB 16.6 07/06/2022   HCT 49.4 07/06/2022   MCV 91 07/06/2022   MCH 30.5 07/06/2022   RDW 13.1 07/06/2022   PLT 282 07/06/2022   Last metabolic panel  Lab Results  Component Value Date   GLUCOSE 94 07/06/2022   NA 143 07/06/2022   K 3.8 07/06/2022   CL 104 07/06/2022   CO2 23 07/06/2022   BUN 14 07/06/2022   CREATININE 1.11 07/06/2022   EGFR 79 07/06/2022   CALCIUM  10.0 07/06/2022   PROT 7.0 07/06/2022   ALBUMIN 4.6 07/06/2022   LABGLOB 2.4 07/06/2022   AGRATIO 1.9 07/06/2022   BILITOT 1.6 (H) 07/06/2022   ALKPHOS 73 07/06/2022   AST 17 07/06/2022   ALT 16 07/06/2022   ANIONGAP 8 05/31/2021   Last lipids Lab Results  Component Value Date   CHOL 136 07/06/2022   HDL 67 07/06/2022   LDLCALC 42 07/06/2022   LDLDIRECT 138 (H) 06/13/2018   TRIG 169 (H) 07/06/2022   CHOLHDL 2.0 07/06/2022   Last hemoglobin A1c Lab Results  Component Value Date   HGBA1C 5.6 04/04/2022   Last thyroid functions Lab Results  Component Value Date   TSH 1.110 06/13/2016   Last vitamin  D Lab Results  Component Value Date   VD25OH 26.4 (L) 12/01/2020   Last vitamin B12 and Folate No results found for: "VITAMINB12", "FOLATE"    The 10-year ASCVD risk score (Arnett DK, et al., 2019) is: 35.3%    Assessment & Plan:  1. Nocturia He will continue current medication regimen.  - tamsulosin  (FLOMAX ) 0.4 MG CAPS capsule; Take 1 capsule (0.4 mg total) by mouth daily.  Dispense: 30 capsule; Refill: 3  2. Essential hypertension He was encouraged to continue on low salty/ DASH diet and exercise asa tolerated. He was instructed to cal office or go to ED if have severe headache, chest pain, palpitation. He will continue current medication regimen. He will come back the next week to check CBC and CMP. - triamterene -hydrochlorothiazide  (DYAZIDE ) 37.5-25 MG capsule; Take 1 capsule by mouth daily.  Dispense: 90 capsule; Refill: 1 - amLODipine  (NORVASC ) 10 MG tablet; Take 1 tablet (10 mg total) by mouth daily.  Dispense: 90 tablet; Refill: 1 - losartan  (COZAAR ) 50 MG tablet; Take 1 tablet (50 mg total) by mouth at bedtime.  Dispense: 90 tablet; Refill: 1  3. Mixed hyperlipidemia He was encouraged on low fat diet and exercise as tolerated. He will continue current medication regimen. He will come back the next week to check lipid panel. - atorvastatin  (LIPITOR) 10 MG tablet; Take 1 tablet (10 mg total) by mouth daily.  Dispense: 90 tablet; Refill: 1  4. Chronic bronchitis, unspecified chronic bronchitis type [facility]) He will continue current medication regimen. He was instructed to go to ED if having SOB. - albuterol  (VENTOLIN  HFA) 108 (90 Base) MCG/ACT inhaler; Inhale 2 puffs into the lungs every 6 (six) hours as needed for wheezing or shortness of breath.  Dispense: 6.7 g; Refill: 1  5. Gastroesophageal reflux disease without esophagitis He was encouraged to avoid spice and fatty food and continue on current medication regimen. - pantoprazole  (PROTONIX ) 20 MG tablet; Take 1 tablet (20 mg  total) by mouth daily.  Dispense: 30 tablet; Refill: 0  6. Urinary urgency (Primary) He complaints urinary urgency and difficulty. To R/O urinary tract infection:  - UA/M w/rflx Culture, Routine   Lab 05/17/2023 Follow up 05/24/2023 in clinic   Debbrah Faith, NP

## 2023-05-10 NOTE — Patient Instructions (Addendum)
 Managing the Challenge of Quitting Smoking Quitting smoking is a physical and mental challenge. You may have cravings, withdrawal symptoms, and temptation to smoke. Before quitting, work with your health care provider to make a plan that can help you manage quitting. Making a plan before you quit may keep you from smoking when you have the urge to smoke while trying to quit. How to manage lifestyle changes Managing stress Stress can make you want to smoke, and wanting to smoke may cause stress. It is important to find ways to manage your stress. You could try some of the following: Practice relaxation techniques. Breathe slowly and deeply, in through your nose and out through your mouth. Listen to music. Soak in a bath or take a shower. Imagine a peaceful place or vacation. Get some support. Talk with family or friends about your stress. Join a support group. Talk with a counselor or therapist. Get some physical activity. Go for a walk, run, or bike ride. Play a favorite sport. Practice yoga.  Medicines Talk with your health care provider about medicines that might help you deal with cravings and make quitting easier for you. Relationships Social situations can be difficult when you are quitting smoking. To manage this, you can: Avoid parties and other social situations where people might be smoking. Avoid alcohol. Leave right away if you have the urge to smoke. Explain to your family and friends that you are quitting smoking. Ask for support and let them know you might be a bit grumpy. Plan activities where smoking is not an option. General instructions Be aware that many people gain weight after they quit smoking. However, not everyone does. To keep from gaining weight, have a plan in place before you quit, and stick to the plan after you quit. Your plan should include: Eating healthy snacks. When you have a craving, it may help to: Eat popcorn, or try carrots, celery, or other cut  vegetables. Chew sugar-free gum. Changing how you eat. Eat small portion sizes at meals. Eat 4-6 small meals throughout the day instead of 1-2 large meals a day. Be mindful when you eat. You should avoid watching television or doing other things that might distract you as you eat. Exercising regularly. Make time to exercise each day. If you do not have time for a long workout, do short bouts of exercise for 5-10 minutes several times a day. Do some form of strengthening exercise, such as weight lifting. Do some exercise that gets your heart beating and causes you to breathe deeply, such as walking fast, running, swimming, or biking. This is very important. Drinking plenty of water or other low-calorie or no-calorie drinks. Drink enough fluid to keep your urine pale yellow.  How to recognize withdrawal symptoms Your body and mind may experience discomfort as you try to get used to not having nicotine  in your system. These effects are called withdrawal symptoms. They may include: Feeling hungrier than normal. Having trouble concentrating. Feeling irritable or restless. Having trouble sleeping. Feeling depressed. Craving a cigarette. These symptoms may surprise you, but they are normal to have when quitting smoking. To manage withdrawal symptoms: Avoid places, people, and activities that trigger your cravings. Remember why you want to quit. Get plenty of sleep. Avoid coffee and other drinks that contain caffeine. These may worsen some of your symptoms. How to manage cravings Come up with a plan for how to deal with your cravings. The plan should include the following: A definition of the specific situation  you want to deal with. An activity or action you will take to replace smoking. A clear idea for how this action will help. The name of someone who could help you with this. Cravings usually last for 5-10 minutes. Consider taking the following actions to help you with your plan to deal  with cravings: Keep your mouth busy. Chew sugar-free gum. Suck on hard candies or a straw. Brush your teeth. Keep your hands and body busy. Change to a different activity right away. Squeeze or play with a ball. Do an activity or a hobby, such as making bead jewelry, practicing needlepoint, or working with wood. Mix up your normal routine. Take a short exercise break. Go for a quick walk, or run up and down stairs. Focus on doing something kind or helpful for someone else. Call a friend or family member to talk during a craving. Join a support group. Contact a quitline. Where to find support To get help or find a support group: Call the National Cancer Institute's Smoking Quitline: 1-800-QUIT-NOW 864 298 9946) Text QUIT to SmokefreeTXT: 308657 Where to find more information Visit these websites to find more information on quitting smoking: [place] Department of Health and Human Services: www.smokefree.gov American Lung Association: www.freedomfromsmoking.org Centers for Disease Control and Prevention (CDC): FootballExhibition.com.br American Heart Association: www.heart.org Contact a health care provider if: You want to change your plan for quitting. The medicines you are taking are not helping. Your eating feels out of control or you cannot sleep. You feel depressed or become very anxious. Summary Quitting smoking is a physical and mental challenge. You will face cravings, withdrawal symptoms, and temptation to smoke again. Preparation can help you as you go through these challenges. Try different techniques to manage stress, handle social situations, and prevent weight gain. You can deal with cravings by keeping your mouth busy (such as by chewing gum), keeping your hands and body busy, calling family or friends, or contacting a quitline for people who want to quit smoking. You can deal with withdrawal symptoms by avoiding places where people smoke, getting plenty of rest, and avoiding drinks that  contain caffeine. This information is not intended to replace advice given to you by your health care provider. Make sure you discuss any questions you have with your health care provider. Document Revised: 04/08/2021 Document Reviewed: 04/08/2021 Elsevier Patient Education  2024 [employer].DASH Eating Plan DASH stands for Dietary Approaches to Stop Hypertension. The DASH eating plan is a healthy eating plan that has been shown to: Lower high blood pressure (hypertension). Reduce your risk for type 2 diabetes, heart disease, and stroke. Help with weight loss. What are tips for following this plan? Reading food labels Check food labels for the amount of salt (sodium) per serving. Choose foods with less than 5 percent of the Daily Value (DV) of sodium. In general, foods with less than 300 milligrams (mg) of sodium per serving fit into this eating plan. To find whole grains, look for the word "whole" as the first word in the ingredient list. Shopping Buy products labeled as "low-sodium" or "no salt added." Buy fresh foods. Avoid canned foods and pre-made or frozen meals. Cooking Try not to add salt when you cook. Use salt-free seasonings or herbs instead of table salt or sea salt. Check with your health care provider or pharmacist before using salt substitutes. Do not fry foods. Cook foods in healthy ways, such as baking, boiling, grilling, roasting, or broiling. Cook using oils that are good for your heart.  These include olive, canola, avocado, soybean, and sunflower oil. Meal planning  Eat a balanced diet. This should include: 4 or more servings of fruits and 4 or more servings of vegetables each day. Try to fill half of your plate with fruits and vegetables. 6-8 servings of whole grains each day. 6 or less servings of lean meat, poultry, or fish each day. 1 oz is 1 serving. A 3 oz (85 g) serving of meat is about the same size as the palm of your hand. One egg is 1 oz (28 g). 2-3 servings  of low-fat dairy each day. One serving is 1 cup (237 mL). 1 serving of nuts, seeds, or beans 5 times each week. 2-3 servings of heart-healthy fats. Healthy fats called omega-3 fatty acids are found in foods such as walnuts, flaxseeds, fortified milks, and eggs. These fats are also found in cold-water fish, such as sardines, salmon, and mackerel. Limit how much you eat of: Canned or prepackaged foods. Food that is high in trans fat, such as fried foods. Food that is high in saturated fat, such as fatty meat. Desserts and other sweets, sugary drinks, and other foods with added sugar. Full-fat dairy products. Do not salt foods before eating. Do not eat more than 4 egg yolks a week. Try to eat at least 2 vegetarian meals a week. Eat more home-cooked food and less restaurant, buffet, and fast food. Lifestyle When eating at a restaurant, ask if your food can be made with less salt or no salt. If you drink alcohol: Limit how much you have to: 0-1 drink a day if you are male. 0-2 drinks a day if you are male. Know how much alcohol is in your drink. In the [place] one drink is one 12 oz bottle of beer (355 mL), one 5 oz glass of wine (148 mL), or one 1 oz glass of hard liquor (44 mL). General information Avoid eating more than 2,300 mg of salt a day. If you have hypertension, you may need to reduce your sodium intake to 1,500 mg a day. Work with your provider to stay at a healthy body weight or lose weight. Ask what the best weight range is for you. On most days of the week, get at least 30 minutes of exercise that causes your heart to beat faster. This may include walking, swimming, or biking. Work with your provider or dietitian to adjust your eating plan to meet your specific calorie needs. What foods should I eat? Fruits All fresh, dried, or frozen fruit. Canned fruits that are in their natural juice and do not have sugar added to them. Vegetables Fresh or frozen vegetables that are raw,  steamed, roasted, or grilled. Low-sodium or reduced-sodium tomato and vegetable juice. Low-sodium or reduced-sodium tomato sauce and tomato paste. Low-sodium or reduced-sodium canned vegetables. Grains Whole-grain or whole-wheat bread. Whole-grain or whole-wheat pasta. Brown rice. Dwyane Glad. Bulgur. Whole-grain and low-sodium cereals. Pita bread. Low-fat, low-sodium crackers. Whole-wheat flour tortillas. Meats and other proteins Skinless chicken or [place]. Ground chicken or [place]. Pork with fat trimmed off. Fish and seafood. Egg whites. Dried beans, peas, or lentils. Unsalted nuts, nut butters, and seeds. Unsalted canned beans. Lean cuts of beef with fat trimmed off. Low-sodium, lean precooked or cured meat, such as sausages or meat loaves. Dairy Low-fat (1%) or fat-free (skim) milk. Reduced-fat, low-fat, or fat-free cheeses. Nonfat, low-sodium ricotta or cottage cheese. Low-fat or nonfat yogurt. Low-fat, low-sodium cheese. Fats and oils Soft margarine without trans fats. Vegetable  oil. Reduced-fat, low-fat, or light mayonnaise and salad dressings (reduced-sodium). Canola, safflower, olive, avocado, soybean, and sunflower oils. Avocado. Seasonings and condiments Herbs. Spices. Seasoning mixes without salt. Other foods Unsalted popcorn and pretzels. Fat-free sweets. The items listed above may not be all the foods and drinks you can have. Talk to a dietitian to learn more. What foods should I avoid? Fruits Canned fruit in a light or heavy syrup. Fried fruit. Fruit in cream or butter sauce. Vegetables Creamed or fried vegetables. Vegetables in a cheese sauce. Regular canned vegetables that are not marked as low-sodium or reduced-sodium. Regular canned tomato sauce and paste that are not marked as low-sodium or reduced-sodium. Regular tomato and vegetable juices that are not marked as low-sodium or reduced-sodium. Vanessa General. Olives. Grains Baked goods made with fat, such as croissants, muffins,  or some breads. Dry pasta or rice meal packs. Meats and other proteins Fatty cuts of meat. Ribs. Fried meat. Helene Loader. Bologna, salami, and other precooked or cured meats, such as sausages or meat loaves, that are not lean and low in sodium. Fat from the back of a pig (fatback). Bratwurst. Salted nuts and seeds. Canned beans with added salt. Canned or smoked fish. Whole eggs or egg yolks. Chicken or [place] with skin. Dairy Whole or 2% milk, cream, and half-and-half. Whole or full-fat cream cheese. Whole-fat or sweetened yogurt. Full-fat cheese. Nondairy creamers. Whipped toppings. Processed cheese and cheese spreads. Fats and oils Butter. Stick margarine. Lard. Shortening. Ghee. Bacon fat. Tropical oils, such as coconut, palm kernel, or palm oil. Seasonings and condiments Onion salt, garlic salt, seasoned salt, table salt, and sea salt. Worcestershire sauce. Tartar sauce. Barbecue sauce. Teriyaki sauce. Soy sauce, including reduced-sodium soy sauce. Steak sauce. Canned and packaged gravies. Fish sauce. Oyster sauce. Cocktail sauce. Store-bought horseradish. Ketchup. Mustard. Meat flavorings and tenderizers. Bouillon cubes. Hot sauces. Pre-made or packaged marinades. Pre-made or packaged taco seasonings. Relishes. Regular salad dressings. Other foods Salted popcorn and pretzels. The items listed above may not be all the foods and drinks you should avoid. Talk to a dietitian to learn more. Where to find more information National Heart, Lung, and Blood Institute (NHLBI): BuffaloDryCleaner.gl American Heart Association (AHA): heart.org Academy of Nutrition and Dietetics: eatright.org National Kidney Foundation (NKF): kidney.org This information is not intended to replace advice given to you by your health care provider. Make sure you discuss any questions you have with your health care provider. Document Revised: 05/04/2022 Document Reviewed: 05/04/2022 Elsevier Patient Education  2024 Else[employer] Inc.Benign  Prostatic Hyperplasia  Benign prostatic hyperplasia (BPH) is an enlarged prostate gland that is caused by the normal aging process. The prostate may get bigger as a man gets older. The condition is not caused by cancer. The prostate is a walnut-sized gland that is involved in the production of semen. It is located in front of the rectum and below the bladder. The bladder stores urine. The urethra carries stored urine out of the body. An enlarged prostate can press on the urethra. This can make it harder to pass urine. The buildup of urine in the bladder can cause infection. Back pressure and infection may progress to bladder damage and kidney (renal) failure. What are the causes? This condition is part of the normal aging process. However, not all men develop problems from this condition. If the prostate enlarges away from the urethra, urine flow will not be blocked. If it enlarges toward the urethra and compresses it, there will be problems passing urine. What increases the risk?  This condition is more likely to develop in men older than 50 years. What are the signs or symptoms? Symptoms of this condition include: Getting up often during the night to urinate. Needing to urinate frequently during the day. Difficulty starting urine flow. Decrease in size and strength of your urine stream. Leaking (dribbling) after urinating. Inability to pass urine. This needs immediate treatment. Inability to completely empty your bladder. Pain when you pass urine. This is more common if there is also an infection. Urinary tract infection (UTI). How is this diagnosed? This condition is diagnosed based on your medical history, a physical exam, and your symptoms. Tests will also be done, such as: A post-void bladder scan. This measures any amount of urine that may remain in your bladder after you finish urinating. A digital rectal exam. In a rectal exam, your health care provider checks your prostate by putting a  lubricated, gloved finger into your rectum to feel the back of your prostate gland. This exam detects the size of your gland and any abnormal lumps or growths. An exam of your urine (urinalysis). A prostate specific antigen (PSA) screening. This is a blood test used to screen for prostate cancer. An ultrasound. This test uses sound waves to electronically produce a picture of your prostate gland. Your health care provider may refer you to a specialist in kidney and prostate diseases (urologist). How is this treated? Once symptoms begin, your health care provider will monitor your condition (active surveillance or watchful waiting). Treatment for this condition will depend on the severity of your condition. Treatment may include: Observation and yearly exams. This may be the only treatment needed if your condition and symptoms are mild. Medicines to relieve your symptoms, including: Medicines to shrink the prostate. Medicines to relax the muscle of the prostate. Surgery in severe cases. Surgery may include: Prostatectomy. In this procedure, the prostate tissue is removed completely through an open incision or with a laparoscope or robotics. Transurethral resection of the prostate (TURP). In this procedure, a tool is inserted through the opening at the tip of the penis (urethra). It is used to cut away tissue of the inner core of the prostate. The pieces are removed through the same opening of the penis. This removes the blockage. Transurethral incision (TUIP). In this procedure, small cuts are made in the prostate. This lessens the prostate's pressure on the urethra. Transurethral microwave thermotherapy (TUMT). This procedure uses microwaves to create heat. The heat destroys and removes a small amount of prostate tissue. Transurethral needle ablation (TUNA). This procedure uses radio frequencies to destroy and remove a small amount of prostate tissue. Interstitial laser coagulation (ILC). This  procedure uses a laser to destroy and remove a small amount of prostate tissue. Transurethral electrovaporization (TUVP). This procedure uses electrodes to destroy and remove a small amount of prostate tissue. Prostatic urethral lift. This procedure inserts an implant to push the lobes of the prostate away from the urethra. Follow these instructions at home: Take over-the-counter and prescription medicines only as told by your health care provider. Monitor your symptoms for any changes. Contact your health care provider with any changes. Avoid drinking large amounts of liquid before going to bed or out in public. Avoid or reduce how much caffeine or alcohol you drink. Give yourself time when you urinate. Keep all follow-up visits. This is important. Contact a health care provider if: You have unexplained back pain. Your symptoms do not get better with treatment. You develop side effects from the  medicine you are taking. Your urine becomes very dark or has a bad smell. Your lower abdomen becomes distended and you have trouble passing urine. Get help right away if: You have a fever or chills. You suddenly cannot urinate. You feel light-headed or very dizzy, or you faint. There are large amounts of blood or clots in your urine. Your urinary problems become hard to manage. You develop moderate to severe low back or flank pain. The flank is the side of your body between the ribs and the hip. These symptoms may be an emergency. Get help right away. Call 911. Do not wait to see if the symptoms will go away. Do not drive yourself to the hospital. Summary Benign prostatic hyperplasia (BPH) is an enlarged prostate that is caused by the normal aging process. It is not caused by cancer. An enlarged prostate can press on the urethra. This can make it hard to pass urine. This condition is more likely to develop in men older than 50 years. Get help right away if you suddenly cannot urinate. This  information is not intended to replace advice given to you by your health care provider. Make sure you discuss any questions you have with your health care provider. Document Revised: 11/03/2020 Document Reviewed: 11/03/2020 Elsevier Patient Education  2024 Else[employer] Inc. DASH Eating Plan DASH stands for Dietary Approaches to Stop Hypertension. The DASH eating plan is a healthy eating plan that has been shown to: Lower high blood pressure (hypertension). Reduce your risk for type 2 diabetes, heart disease, and stroke. Help with weight loss. What are tips for following this plan? Reading food labels Check food labels for the amount of salt (sodium) per serving. Choose foods with less than 5 percent of the Daily Value (DV) of sodium. In general, foods with less than 300 milligrams (mg) of sodium per serving fit into this eating plan. To find whole grains, look for the word "whole" as the first word in the ingredient list. Shopping Buy products labeled as "low-sodium" or "no salt added." Buy fresh foods. Avoid canned foods and pre-made or frozen meals. Cooking Try not to add salt when you cook. Use salt-free seasonings or herbs instead of table salt or sea salt. Check with your health care provider or pharmacist before using salt substitutes. Do not fry foods. Cook foods in healthy ways, such as baking, boiling, grilling, roasting, or broiling. Cook using oils that are good for your heart. These include olive, canola, avocado, soybean, and sunflower oil. Meal planning  Eat a balanced diet. This should include: 4 or more servings of fruits and 4 or more servings of vegetables each day. Try to fill half of your plate with fruits and vegetables. 6-8 servings of whole grains each day. 6 or less servings of lean meat, poultry, or fish each day. 1 oz is 1 serving. A 3 oz (85 g) serving of meat is about the same size as the palm of your hand. One egg is 1 oz (28 g). 2-3 servings of low-fat dairy each  day. One serving is 1 cup (237 mL). 1 serving of nuts, seeds, or beans 5 times each week. 2-3 servings of heart-healthy fats. Healthy fats called omega-3 fatty acids are found in foods such as walnuts, flaxseeds, fortified milks, and eggs. These fats are also found in cold-water fish, such as sardines, salmon, and mackerel. Limit how much you eat of: Canned or prepackaged foods. Food that is high in trans fat, such as fried foods. Food  that is high in saturated fat, such as fatty meat. Desserts and other sweets, sugary drinks, and other foods with added sugar. Full-fat dairy products. Do not salt foods before eating. Do not eat more than 4 egg yolks a week. Try to eat at least 2 vegetarian meals a week. Eat more home-cooked food and less restaurant, buffet, and fast food. Lifestyle When eating at a restaurant, ask if your food can be made with less salt or no salt. If you drink alcohol: Limit how much you have to: 0-1 drink a day if you are male. 0-2 drinks a day if you are male. Know how much alcohol is in your drink. In the [place] one drink is one 12 oz bottle of beer (355 mL), one 5 oz glass of wine (148 mL), or one 1 oz glass of hard liquor (44 mL). General information Avoid eating more than 2,300 mg of salt a day. If you have hypertension, you may need to reduce your sodium intake to 1,500 mg a day. Work with your provider to stay at a healthy body weight or lose weight. Ask what the best weight range is for you. On most days of the week, get at least 30 minutes of exercise that causes your heart to beat faster. This may include walking, swimming, or biking. Work with your provider or dietitian to adjust your eating plan to meet your specific calorie needs. What foods should I eat? Fruits All fresh, dried, or frozen fruit. Canned fruits that are in their natural juice and do not have sugar added to them. Vegetables Fresh or frozen vegetables that are raw, steamed, roasted, or  grilled. Low-sodium or reduced-sodium tomato and vegetable juice. Low-sodium or reduced-sodium tomato sauce and tomato paste. Low-sodium or reduced-sodium canned vegetables. Grains Whole-grain or whole-wheat bread. Whole-grain or whole-wheat pasta. Brown rice. Dwyane Glad. Bulgur. Whole-grain and low-sodium cereals. Pita bread. Low-fat, low-sodium crackers. Whole-wheat flour tortillas. Meats and other proteins Skinless chicken or [place]. Ground chicken or [place]. Pork with fat trimmed off. Fish and seafood. Egg whites. Dried beans, peas, or lentils. Unsalted nuts, nut butters, and seeds. Unsalted canned beans. Lean cuts of beef with fat trimmed off. Low-sodium, lean precooked or cured meat, such as sausages or meat loaves. Dairy Low-fat (1%) or fat-free (skim) milk. Reduced-fat, low-fat, or fat-free cheeses. Nonfat, low-sodium ricotta or cottage cheese. Low-fat or nonfat yogurt. Low-fat, low-sodium cheese. Fats and oils Soft margarine without trans fats. Vegetable oil. Reduced-fat, low-fat, or light mayonnaise and salad dressings (reduced-sodium). Canola, safflower, olive, avocado, soybean, and sunflower oils. Avocado. Seasonings and condiments Herbs. Spices. Seasoning mixes without salt. Other foods Unsalted popcorn and pretzels. Fat-free sweets. The items listed above may not be all the foods and drinks you can have. Talk to a dietitian to learn more. What foods should I avoid? Fruits Canned fruit in a light or heavy syrup. Fried fruit. Fruit in cream or butter sauce. Vegetables Creamed or fried vegetables. Vegetables in a cheese sauce. Regular canned vegetables that are not marked as low-sodium or reduced-sodium. Regular canned tomato sauce and paste that are not marked as low-sodium or reduced-sodium. Regular tomato and vegetable juices that are not marked as low-sodium or reduced-sodium. Vanessa General. Olives. Grains Baked goods made with fat, such as croissants, muffins, or some breads. Dry  pasta or rice meal packs. Meats and other proteins Fatty cuts of meat. Ribs. Fried meat. Helene Loader. Bologna, salami, and other precooked or cured meats, such as sausages or meat loaves, that are not lean  and low in sodium. Fat from the back of a pig (fatback). Bratwurst. Salted nuts and seeds. Canned beans with added salt. Canned or smoked fish. Whole eggs or egg yolks. Chicken or [place] with skin. Dairy Whole or 2% milk, cream, and half-and-half. Whole or full-fat cream cheese. Whole-fat or sweetened yogurt. Full-fat cheese. Nondairy creamers. Whipped toppings. Processed cheese and cheese spreads. Fats and oils Butter. Stick margarine. Lard. Shortening. Ghee. Bacon fat. Tropical oils, such as coconut, palm kernel, or palm oil. Seasonings and condiments Onion salt, garlic salt, seasoned salt, table salt, and sea salt. Worcestershire sauce. Tartar sauce. Barbecue sauce. Teriyaki sauce. Soy sauce, including reduced-sodium soy sauce. Steak sauce. Canned and packaged gravies. Fish sauce. Oyster sauce. Cocktail sauce. Store-bought horseradish. Ketchup. Mustard. Meat flavorings and tenderizers. Bouillon cubes. Hot sauces. Pre-made or packaged marinades. Pre-made or packaged taco seasonings. Relishes. Regular salad dressings. Other foods Salted popcorn and pretzels. The items listed above may not be all the foods and drinks you should avoid. Talk to a dietitian to learn more. Where to find more information National Heart, Lung, and Blood Institute (NHLBI): BuffaloDryCleaner.gl American Heart Association (AHA): heart.org Academy of Nutrition and Dietetics: eatright.org National Kidney Foundation (NKF): kidney.org This information is not intended to replace advice given to you by your health care provider. Make sure you discuss any questions you have with your health care provider. Document Revised: 05/04/2022 Document Reviewed: 05/04/2022 Elsevier Patient Education  2024 [employer].

## 2023-05-11 ENCOUNTER — Other Ambulatory Visit: Payer: Self-pay

## 2023-05-11 LAB — MICROSCOPIC EXAMINATION
Bacteria, UA: NONE SEEN
Casts: NONE SEEN /[LPF]
Epithelial Cells (non renal): NONE SEEN /[HPF] (ref 0–10)
RBC, Urine: NONE SEEN /[HPF] (ref 0–2)
WBC, UA: NONE SEEN /[HPF] (ref 0–5)

## 2023-05-11 LAB — UA/M W/RFLX CULTURE, ROUTINE
Bilirubin, UA: NEGATIVE
Glucose, UA: NEGATIVE
Ketones, UA: NEGATIVE
Leukocytes,UA: NEGATIVE
Nitrite, UA: NEGATIVE
Protein,UA: NEGATIVE
RBC, UA: NEGATIVE
Specific Gravity, UA: 1.019 (ref 1.005–1.030)
Urobilinogen, Ur: 1 mg/dL (ref 0.2–1.0)
pH, UA: 7 (ref 5.0–7.5)

## 2023-05-17 ENCOUNTER — Other Ambulatory Visit: Payer: Self-pay | Admitting: Gerontology

## 2023-05-17 ENCOUNTER — Other Ambulatory Visit: Payer: Self-pay

## 2023-05-17 DIAGNOSIS — R351 Nocturia: Secondary | ICD-10-CM

## 2023-05-17 DIAGNOSIS — N529 Male erectile dysfunction, unspecified: Secondary | ICD-10-CM

## 2023-05-18 LAB — PSA: Prostate Specific Ag, Serum: 1.5 ng/mL (ref 0.0–4.0)

## 2023-05-22 NOTE — Progress Notes (Unsigned)
05/24/23 11:37 AM   Christopher Harrell 01/23/1971 [id number]  Referring provider:  Rolm Gala, NP [address] [place],  [place] [address]  Urological history  1. ED -contributing factors of age, HTN, HLD, diabetes and smoking -testosterone level 306 03/2021 -managed with sildenafil 100 mg, on-demand-dosing   2. BPH w/ LUTS -PSA (05/2023) 1.5 -Tamsulosin 0.4 mg daily  HPI: Christopher Harrell is a 53 y.o.male who presents today for a 1 year follow-up.    Previous records reviewed.      I PSS 15/6  PVR 55 mL   He has been having very severe urgency, urge incontinence, very weak urinary stream, urinary dribbling and feeling of incomplete bladder emptying for the last several weeks.  He is still bothered by his urinary issues that he said, in a teasing manner, "I wish I could just cut it off."   Patient denies any modifying or aggravating factors.  Patient denies any recent UTI's, gross hematuria, dysuria or suprapubic/flank pain.  Patient denies any fevers, chills, nausea or vomiting.   He says he is taking the tamsulosin 0.4 mg daily, but epic does not reflect this.   Previous UAs have been benign.   IPSS     Row Name 05/24/23 1000         International Prostate Symptom Score   How often have you had the sensation of not emptying your bladder? About half the time     How often have you had to urinate less than every two hours? Not at All     How often have you found you stopped and started again several times when you urinated? More than half the time     How often have you found it difficult to postpone urination? More than half the time     How often have you had a weak urinary stream? Not at All     How often have you had to strain to start urination? Not at All     How many times did you typically get up at night to urinate? 4 Times     Total IPSS Score 15       Quality of Life due to urinary symptoms   If you were to spend the rest of your life with  your urinary condition just the way it is now how would you feel about that? Terrible              Score:  1-7 Mild 8-19 Moderate 20-35 Severe   SHIM 14  Patient is not having spontaneous erections.  He denies any pain or curvature with erections.   He does have adequate erections with sildenafil 100 mg on demand dosing.   SHIM     Row Name 05/24/23 1054         SHIM: Over the last 6 months:   How do you rate your confidence that you could get and keep an erection? Low     When you had erections with sexual stimulation, how often were your erections hard enough for penetration (entering your partner)? Sometimes (about half the time)     During sexual intercourse, how often were you able to maintain your erection after you had penetrated (entered) your partner? Sometimes (about half the time)     During sexual intercourse, how difficult was it to maintain your erection to completion of intercourse? Difficult     When you attempted sexual intercourse, how often was it satisfactory for you?  Sometimes (about half the time)       SHIM Total Score   SHIM 14               Score: 1-7 Severe ED 8-11 Moderate ED 12-16 Mild-Moderate ED 17-21 Mild ED 22-25 No ED    PMH: Past Medical History:  Diagnosis Date   Community acquired pneumonia    a. Dx 01/21/2016 - [facility] PNA.   ED (erectile dysfunction)    Essential hypertension    Hyperlipidemia    Tobacco abuse     Surgical History: Past Surgical History:  Procedure Laterality Date   COLONOSCOPY WITH PROPOFOL N/A 04/05/2021   Procedure: COLONOSCOPY WITH PROPOFOL;  Surgeon: Midge Minium, MD;  Location[facility] ENDOSCOPY;  Service: Endoscopy;  Laterality: N/A;    Home Medications:  Allergies as of 05/24/2023   No Known Allergies      Medication List        Accurate as of May 24, 2023 11:37 AM. If you have any questions, ask your nurse or doctor.          albuterol 108 (90 Base) MCG/ACT inhaler Commonly  known as: VENTOLIN HFA Inhale 2 puffs into the lungs every 6 (six) hours as needed for wheezing or shortness of breath.   amLODipine 10 MG tablet Commonly known as: NORVASC Take 1 tablet (10 mg total) by mouth daily.   atenolol 25 MG tablet Commonly known as: TENORMIN   atorvastatin 10 MG tablet Commonly known as: LIPITOR Take 1 tablet (10 mg total) by mouth daily.   losartan 50 MG tablet Commonly known as: COZAAR Take 1 tablet (50 mg total) by mouth at bedtime.   meloxicam 15 MG tablet Commonly known as: MOBIC Take 1 tablet (15 mg total) by mouth daily.   methocarbamol 500 MG tablet Commonly known as: ROBAXIN Take 1 tablet (500 mg total) by mouth 4 (four) times daily.   multivitamin with minerals Tabs tablet Take 1 tablet by mouth daily.   pantoprazole 20 MG tablet Commonly known as: PROTONIX Take 1 tablet (20 mg total) by mouth daily.   sildenafil 100 MG tablet Commonly known as: VIAGRA Take 1 tablet (100 mg total) by mouth daily as needed for erectile dysfunction.   tamsulosin 0.4 MG Caps capsule Commonly known as: FLOMAX Take 1 capsule (0.4 mg total) by mouth daily.   triamterene-hydrochlorothiazide 37.5-25 MG capsule Commonly known as: DYAZIDE Take 1 capsule by mouth daily.        Allergies:  No Known Allergies  Family History: Family History  Problem Relation Age of Onset   Hypertension Mother    Lung cancer Mother    Hyperlipidemia Mother    Heart attack Father 66   Heart attack Maternal Grandmother    Other Maternal Grandfather        unknown medical history   Other Paternal Grandmother        unknown medical history   Other Paternal Grandfather        unknown medical history    Social History:  reports that he has been smoking cigarettes. He has a 10 pack-year smoking history. He has never used smokeless tobacco. He reports current alcohol use of about 4.0 standard drinks of alcohol per week. He reports that he does not use  drugs.   Physical Exam: BP (!) 167/116   Pulse 76   Ht 5\' 9"  (1.753 m)   Wt 175 lb (79.4 kg)   BMI 25.84 kg/m   Constitutional:  Well  nourished. Alert and oriented, No acute distress. HEENT: [place] AT, moist mucus membranes.  Trachea midline Cardiovascular: No clubbing, cyanosis, or edema. Respiratory: Normal respiratory effort, no increased work of breathing. GU: No CVA tenderness.  No bladder fullness or masses.  Patient with circumcised phallus.  Urethral meatus is patent.  No penile discharge. No penile lesions or rashes. Scrotum without lesions, cysts, rashes and/or edema.  Testicles are located scrotally bilaterally. No masses are appreciated in the testicles. Left and right epididymis are normal. Rectal: Patient with  normal sphincter tone. Anus and perineum without scarring or rashes. No rectal masses are appreciated. Prostate is approximately 60 + grams, could not palpate the entire gland due to tense buttocks, no nodules are appreciated. Seminal vesicles could not be palpated.  Neurologic: Grossly intact, no focal deficits, moving all 4 extremities. Psychiatric: Normal mood and affect.   Laboratory Data: Lipid Panel     Component Value Date/Time   CHOL 136 07/06/2022 1419   TRIG 169 (H) 07/06/2022 1419   HDL 67 07/06/2022 1419   CHOLHDL 2.0 07/06/2022 1419   LDLCALC 42 07/06/2022 1419   LDLDIRECT 138 (H) 06/13/2018 1836   LABVLDL 27 07/06/2022 1419    CMP     Component Value Date/Time   NA 143 07/06/2022 1419   K 3.8 07/06/2022 1419   CL 104 07/06/2022 1419   CO2 23 07/06/2022 1419   GLUCOSE 94 07/06/2022 1419   GLUCOSE 132 (H) 05/31/2021 0940   BUN 14 07/06/2022 1419   CREATININE 1.11 07/06/2022 1419   CALCIUM 10.0 07/06/2022 1419   PROT 7.0 07/06/2022 1419   ALBUMIN 4.6 07/06/2022 1419   AST 17 07/06/2022 1419   ALT 16 07/06/2022 1419   ALKPHOS 73 07/06/2022 1419   BILITOT 1.6 (H) 07/06/2022 1419   EGFR 79 07/06/2022 1419   GFRNONAA >60 05/31/2021 0940    Component     Latest Ref Rng 05/10/2023  Glucose, UA     Negative  Negative   Specific Gravity, UA     1.005 - 1.030  1.019   pH, UA     5.0 - 7.5  7.0   Color, UA     Yellow  Yellow   Appearance Ur     Clear  Clear   Leukocytes,UA     Negative  Negative   Protein,UA     Negative/Trace  Negative   Ketones, UA     Negative  Negative   RBC, UA     Negative  Negative   Bilirubin, UA     Negative  Negative   Urobilinogen, Ur     0.2 - 1.0 mg/dL 1.0   Nitrite, UA     Negative  Negative   Microscopic Examination See below:   Microscopic Examination Comment   Urinalysis Reflex Comment     Component     Latest Ref Rng 05/10/2023  WBC, UA     0 - 5 /hpf None seen   Bacteria, UA     None seen/Few  None seen   RBC, Urine     0 - 2 /hpf None seen   Epithelial Cells (non renal)     0 - 10 /hpf None seen   Casts     None seen /lpf None seen   I have reviewed the labs.     Pertinent Imaging:  05/24/23 10:54  Scan Result 55 ml     Assessment & Plan:    1. BPH with LUTS -PSA  stable  -DRE enlarged prostate  -UA's benign  -PVR < 300 cc  -most bothersome symptoms are urgency, urge incontinence, weak urinary stream, and urinary dribbling -continue conservative management, avoiding bladder irritants and timed voiding's -Continue tamsulosin 0.4 mg daily -We discussed adding finasteride to the tamsulosin to see if we could decrease the size of the prostate and improve his urinary symptoms, but I did explain this may take up to 6 months to see effect and the finasteride has erectile dysfunction as 1 of its side effects and if his symptoms are due to something other than BPH, the finasteride will not be helpful -We could also pursue a workup for a bladder outlet procedure which would consist of a cystoscopy and a TRUS, he would like to pursue this option -I explained the TRUS is an ultrasound of the prostate that is performed with a small ultrasound probe placed in the rectum to  get ultrasound images of the prostate for sizing -Explained that the cystoscopy is a safe and common diagnostic test performed by one of our physicians in the office.  It consist of using a thin, lighted tube to look directly inside the bladder, prostate  and urethra to evaluate the anatomy.  The procedure is brief, typically taking about 5 minutes. -This will enable [place] to assess bladder health, diagnose and enlarged prostate, assess which BPH procedure may be most appropriate and rule out other bladder conditions (stricture disease, stones, cancer, etc.)  -Advised the patient that there are no restrictions to eating or drinking prior to the cystoscopy -They can continue to take all of their medications as prescribed -They can drive themselves to and from the appointment -I explained that during the procedure, the area around the urethra will be cleansed thoroughly, topical anesthetic will be applied to numb your urethra, the thin tube is then gently inserted through the urethra into your bladder while fluid flows through the tube to the bladder to enable better visualization -I explained the procedure is usually not painful, however there may be some discomfort (pinching feeling), and they may feel an urge to urinate, coolness or fullness in the bladder and then the cystoscope is removed -After the cystoscopy, I advised them that they may experience urinary frequency, hematuria, dysuria which will resolve within 24 to 48 hours -Reviewed red flag signs (fever, bright red blood or blood clots in the urine, abdominal pain or difficulty urinating) and to contact the office immediately or seek treatment in the ED if they should experience any of these -The physician will discuss the results of the cystoscopy at the time of the procedure    2. ED -Good response with sildenafil 100 mg prior to intercourse -Continue sildenafil 100 mg, on-demand dosing  Return for cysto/TRUS for further evaluation of LUTS  .  Cloretta Ned   [facility] [address], [address] [place], [place] [address] [phone]

## 2023-05-24 ENCOUNTER — Ambulatory Visit (INDEPENDENT_AMBULATORY_CARE_PROVIDER_SITE_OTHER): Payer: Self-pay | Admitting: Urology

## 2023-05-24 ENCOUNTER — Encounter: Payer: Self-pay | Admitting: Urology

## 2023-05-24 ENCOUNTER — Ambulatory Visit: Payer: Self-pay | Admitting: Gerontology

## 2023-05-24 VITALS — BP 167/116 | HR 76 | Ht 69.0 in | Wt 175.0 lb

## 2023-05-24 DIAGNOSIS — N529 Male erectile dysfunction, unspecified: Secondary | ICD-10-CM

## 2023-05-24 DIAGNOSIS — N401 Enlarged prostate with lower urinary tract symptoms: Secondary | ICD-10-CM

## 2023-05-24 DIAGNOSIS — R351 Nocturia: Secondary | ICD-10-CM

## 2023-05-24 LAB — BLADDER SCAN AMB NON-IMAGING: Scan Result: 55

## 2023-05-24 MED ORDER — SILDENAFIL CITRATE 100 MG PO TABS: 100.0000 mg | ORAL_TABLET | Freq: Every day | ORAL | 3 refills | Status: DC | PRN

## 2023-05-24 NOTE — Patient Instructions (Signed)
Transrectal Ultrasound  A transrectal ultrasound is a procedure that uses sound waves to create images of the prostate gland and nearby tissues. For this procedure, an ultrasound probe is placed in the rectum. The probe sends sound waves through the wall of the rectum into the prostate gland. The prostate is a walnut-sized gland that is located below the bladder and in front of the rectum. The images show the size and shape of the prostate gland and nearby structures. You may need this test if you have: Trouble urinating. Trouble getting your partner pregnant (infertility). An abnormal result from a prostate screening exam. Tell a health care provider about: Any allergies you have. All medicines you are taking, including vitamins, herbs, eye drops, creams, and over-the-counter medicines. Any bleeding problems you have. Any surgeries you have had. Any medical conditions you have. Any prostate infections you have had. What are the risks? Generally, this is a safe procedure. However, problems may occur, including: Discomfort during the procedure. Blood in your urine or sperm after the procedure. This may occur if a sample of tissue is taken to look at under a microscope (biopsy) during the procedure. What happens before the procedure? Your health care provider may instruct you to use an enema 1-4 hours before the procedure. Follow instructions from your health care provider about how to do the enema. Ask your health care provider about: Changing or stopping your regular medicines. This is especially important if you are taking diabetes medicines or blood thinners. Taking medicines such as aspirin and ibuprofen. These medicines can thin your blood. Do not take these medicines unless your health care provider tells you to take them. Taking over-the-counter medicines, vitamins, herbs, and supplements. What happens during the procedure? You will be asked to lie down on your left side on an exam  table. You will bend your knees toward your chest. Gel will be put on a small probe that is about the width of a finger. The probe will be gently inserted into your rectum. You may have a feeling of fullness but should not feel pain. The probe will send signals to a computer that will create images. These will be displayed on a monitor that looks like a small television screen. The technician will slightly rotate the probe throughout the procedure. While rotating the probe, he or she will view and capture images of the prostate gland and the surrounding structures from different angles. Your health care provider may take a biopsy sample of prostate tissue during the procedure. The images captured from the ultrasound will help guide the needle that is used to remove a sample of tissue. The sample will be sent to a lab for testing. The probe will be removed. The procedure may vary among health care providers and hospitals. What can I expect after the procedure? It is up to you to get the results of your procedure. Ask your health care provider, or the department that is doing the procedure, when your results will be ready. Keep all follow-up visits. This is important. Summary A transrectal ultrasound is a procedure that uses sound waves to create images of the prostate gland and nearby tissues. The images show the size and shape of the prostate gland and nearby structures. Before the procedure, ask your health care provider about changing or stopping your regular medicines. This is especially important if you are taking diabetes medicines or blood thinners. This information is not intended to replace advice given to you by your health care  provider. Make sure you discuss any questions you have with your health care provider. Document Revised: 12/29/2020 Document Reviewed: 10/11/2020 Elsevier Patient Education  2024 Elsevier Inc.    Cystoscopy Cystoscopy is a procedure that is used to help  diagnose and sometimes treat conditions that affect the lower urinary tract. The lower urinary tract includes the bladder and the urethra. The urethra is the tube that drains urine from the bladder. Cystoscopy is done using a thin, tube-shaped instrument with a light and camera at the end (cystoscope). The cystoscope may be hard or flexible, depending on the goal of the procedure. The cystoscope is inserted through the urethra, into the bladder. Cystoscopy may be recommended if you have: Urinary tract infections that keep coming back. Blood in the urine (hematuria). An inability to control when you urinate (urinary incontinence) or an overactive bladder. Unusual cells found in a urine sample. A blockage in the urethra, such as a urinary stone. Painful urination. An abnormality in the bladder found during an intravenous pyelogram (IVP) or CT scan. What are the risks? Generally, this is a safe procedure. However, problems may occur, including: Infection. Bleeding.  What happens during the procedure?  You will be given one or more of the following: A medicine to numb the area (local anesthetic). The area around the opening of your urethra will be cleaned. The cystoscope will be passed through your urethra into your bladder. Germ-free (sterile) fluid will flow through the cystoscope to fill your bladder. The fluid will stretch your bladder so that your health care provider can clearly examine your bladder walls. Your doctor will look at the urethra and bladder. The cystoscope will be removed The procedure may vary among health care providers  What can I expect after the procedure? After the procedure, it is common to have: Some soreness or pain in your urethra. Urinary symptoms. These include: Mild pain or burning when you urinate. Pain should stop within a few minutes after you urinate. This may last for up to a few days after the procedure. A small amount of blood in your urine for several  days. Feeling like you need to urinate but producing only a small amount of urine. Follow these instructions at home: General instructions Return to your normal activities as told by your health care provider.  Drink plenty of fluids after the procedure. Keep all follow-up visits as told by your health care provider. This is important. Contact a health care provider if you: Have pain that gets worse or does not get better with medicine, especially pain when you urinate lasting longer than 72 hours after the procedure. Have trouble urinating. Get help right away if you: Have blood clots in your urine. Have a fever or chills. Are unable to urinate. Summary Cystoscopy is a procedure that is used to help diagnose and sometimes treat conditions that affect the lower urinary tract. Cystoscopy is done using a thin, tube-shaped instrument with a light and camera at the end. After the procedure, it is common to have some soreness or pain in your urethra. It is normal to have blood in your urine after the procedure.  If you were prescribed an antibiotic medicine, take it as told by your health care provider.  This information is not intended to replace advice given to you by your health care provider. Make sure you discuss any questions you have with your health care provider. Document Revised: 04/09/2018 Document Reviewed: 04/09/2018 Elsevier Patient Education  2020 [employer].

## 2023-05-29 ENCOUNTER — Other Ambulatory Visit: Payer: Self-pay

## 2023-06-09 ENCOUNTER — Emergency Department
Admission: EM | Admit: 2023-06-09 | Discharge: 2023-06-09 | Disposition: A | Discharge status: Home / Self Care | Payer: Self-pay | Attending: Emergency Medicine | Admitting: Emergency Medicine

## 2023-06-09 ENCOUNTER — Other Ambulatory Visit: Payer: Self-pay

## 2023-06-09 DIAGNOSIS — F17200 Nicotine dependence, unspecified, uncomplicated: Secondary | ICD-10-CM | POA: Insufficient documentation

## 2023-06-09 DIAGNOSIS — Z20822 Contact with and (suspected) exposure to covid-19: Secondary | ICD-10-CM | POA: Insufficient documentation

## 2023-06-09 DIAGNOSIS — J069 Acute upper respiratory infection, unspecified: Secondary | ICD-10-CM | POA: Insufficient documentation

## 2023-06-09 DIAGNOSIS — J209 Acute bronchitis, unspecified: Secondary | ICD-10-CM | POA: Insufficient documentation

## 2023-06-09 DIAGNOSIS — J42 Unspecified chronic bronchitis: Secondary | ICD-10-CM

## 2023-06-09 LAB — RESP PANEL BY RT-PCR (RSV, FLU A&B, COVID)  RVPGX2
Influenza A by PCR: NEGATIVE
Influenza B by PCR: NEGATIVE
Resp Syncytial Virus by PCR: NEGATIVE
SARS Coronavirus 2 by RT PCR: NEGATIVE

## 2023-06-09 MED ORDER — AZITHROMYCIN 250 MG PO TABS: ORAL_TABLET | ORAL | 0 refills | Status: AC

## 2023-06-09 MED ORDER — ALBUTEROL SULFATE HFA 108 (90 BASE) MCG/ACT IN AERS
2.0000 | INHALATION_SPRAY | Freq: Four times a day (QID) | RESPIRATORY_TRACT | 1 refills | Status: AC | PRN
  Filled 2023-06-25: qty 6.7, 30d supply, fill #0
  Filled 2023-08-10: qty 6.7, 25d supply, fill #0
  Filled 2024-01-24: qty 6.7, 25d supply, fill #1

## 2023-06-09 MED ORDER — AEROCHAMBER MV MISC: 0 refills | Status: AC

## 2023-06-09 NOTE — ED Provider Notes (Signed)
 [facility] Provider Note   Event Date/Time   First MD Initiated Contact with Patient 06/09/23 [phone]     (approximate) History  Cough  HPI Dvonte Gatliff is a 53 y.o. male with a stated past medical history of tobacco abuse who presents complaining of cough, congestion, and shortness of breath with dyspnea on exertion that is worsening over the last 2 weeks.  Patient states has been using DayQuil/NyQuil/Tylenol /ibuprofen  for cough, pain, and fever.  Patient states that the symptoms initially got a little bit better and then worsened over the last few days.  Patient also endorses associated sore throat ROS: Patient currently denies any vision changes, tinnitus, difficulty speaking, facial droop, chest pain, abdominal pain, nausea/vomiting/diarrhea, dysuria, or weakness/numbness/paresthesias in any extremity   Physical Exam  Triage Vital Signs: ED Triage Vitals  Encounter Vitals Group     BP 06/09/23 0732 (!) 177/127     Systolic BP Percentile --      Diastolic BP Percentile --      Pulse Rate 06/09/23 0732 98     Resp 06/09/23 0732 18     Temp 06/09/23 0732 97.6 F (36.4 C)     Temp Source 06/09/23 0732 Oral     SpO2 06/09/23 0732 98 %     Weight 06/09/23 0733 174 lb 2.6 oz (79 kg)     Height --      Head Circumference --      Peak Flow --      Pain Score 06/09/23 0731 8     Pain Loc --      Pain Education --      Exclude from Growth Chart --    Most recent vital signs: Vitals:   06/09/23 0732  BP: (!) 177/127  Pulse: 98  Resp: 18  Temp: 97.6 F (36.4 C)  SpO2: 98%   General: Awake, oriented x4. CV:  Good peripheral perfusion.  Resp:  Normal effort.  Inspiratory and expiratory wheezing over bilateral lung fields with associated rhonchi Abd:  No distention.  Other:  Middle-aged well-developed, well-nourished African-American male resting comfortably in no acute distress ED Results / Procedures / Treatments  Labs (all labs ordered are listed,  but only abnormal results are displayed) Labs Reviewed  RESP PANEL BY RT-PCR (RSV, FLU A&B, COVID)  [id number]   PROCEDURES: Critical Care performed: No Procedures MEDICATIONS ORDERED IN ED: Medications - No data to display IMPRESSION / MDM / ASSESSMENT AND PLAN / ED COURSE  I reviewed the triage vital signs and the nursing notes.                             The patient is on the cardiac monitor to evaluate for evidence of arrhythmia and/or significant heart rate changes. Patient's presentation is most consistent with acute presentation with potential threat to life or bodily function. The patient appears to be suffering from a likely upper respiratory infection with moderate wheezing.  Given patient's tobacco use history of this may be an element of COPD as well.  Based on the history, exam, I don't suspect any other emergent cause of this presentation, such as pneumonia, acute coronary syndrome, congestive heart failure, pulmonary embolism, or pneumothorax. Patient's viral swab pending.  Patient was encouraged to look up the results online.  Patient will be given quarantine precautions Rx: Steroids, Antibiotics, Albuterol  Disposition: Discharge home with SRP. PCP follow up recommended in next 48hours.   FINAL  CLINICAL IMPRESSION(S) / ED DIAGNOSES   Final diagnoses:  Upper respiratory tract infection, unspecified type  Acute bronchitis with wheezing   Rx / DC Orders   ED Discharge Orders          Ordered    albuterol  (VENTOLIN  HFA) 108 (90 Base) MCG/ACT inhaler  Every 6 hours PRN        06/09/23 0749    Spacer/Aero-Holding Chambers (AEROCHAMBER MV) inhaler        06/09/23 0749    azithromycin  (ZITHROMAX  Z-PAK) 250 MG tablet        06/09/23 0749           Note:  This document was prepared using Dragon voice recognition software and may include unintentional dictation errors.   Bentli Llorente K, MD 06/09/23 [phone]

## 2023-06-09 NOTE — ED Triage Notes (Signed)
 C/O cough and congestion x 2 weeks. C/O chills, decreased appetite. Denies N/V/D

## 2023-06-25 ENCOUNTER — Other Ambulatory Visit: Payer: Self-pay

## 2023-06-26 ENCOUNTER — Ambulatory Visit: Payer: Self-pay | Admitting: Gerontology

## 2023-06-28 ENCOUNTER — Ambulatory Visit: Payer: Self-pay | Admitting: Gerontology

## 2023-07-03 ENCOUNTER — Ambulatory Visit: Payer: Self-pay | Admitting: Gerontology

## 2023-07-04 ENCOUNTER — Encounter: Payer: Self-pay | Admitting: Urology

## 2023-07-06 ENCOUNTER — Other Ambulatory Visit: Payer: Self-pay

## 2023-07-12 ENCOUNTER — Encounter: Payer: Self-pay | Admitting: Urology

## 2023-08-10 ENCOUNTER — Other Ambulatory Visit: Payer: Self-pay

## 2023-08-14 ENCOUNTER — Other Ambulatory Visit: Payer: Self-pay

## 2023-08-15 ENCOUNTER — Encounter: Payer: Self-pay | Admitting: Urology

## 2023-08-23 ENCOUNTER — Telehealth: Payer: Self-pay | Admitting: Gerontology

## 2023-08-23 NOTE — Telephone Encounter (Signed)
 Scheduled appt for 08/30/23 at 9:00

## 2023-08-28 ENCOUNTER — Encounter: Payer: Self-pay | Admitting: Urology

## 2023-08-29 ENCOUNTER — Encounter: Payer: Self-pay | Admitting: Urology

## 2023-08-30 ENCOUNTER — Ambulatory Visit: Payer: Self-pay | Admitting: Gerontology

## 2023-08-30 ENCOUNTER — Encounter: Payer: Self-pay | Admitting: Gerontology

## 2023-08-30 ENCOUNTER — Other Ambulatory Visit: Payer: Self-pay

## 2023-08-30 VITALS — BP 136/95 | HR 95 | Resp 17 | Ht 68.0 in | Wt 176.2 lb

## 2023-08-30 DIAGNOSIS — I10 Essential (primary) hypertension: Secondary | ICD-10-CM

## 2023-08-30 DIAGNOSIS — R351 Nocturia: Secondary | ICD-10-CM

## 2023-08-30 DIAGNOSIS — E782 Mixed hyperlipidemia: Secondary | ICD-10-CM

## 2023-08-30 DIAGNOSIS — Z0000 Encounter for general adult medical examination without abnormal findings: Secondary | ICD-10-CM

## 2023-08-30 MED ORDER — AMLODIPINE BESYLATE 10 MG PO TABS
10.0000 mg | ORAL_TABLET | Freq: Every day | ORAL | 1 refills | Status: AC
  Filled 2023-08-30 – 2024-01-24 (×2): qty 90, 90d supply, fill #0

## 2023-08-30 MED ORDER — TRIAMTERENE-HCTZ 37.5-25 MG PO CAPS
1.0000 | ORAL_CAPSULE | Freq: Every day | ORAL | 1 refills | Status: AC
  Filled 2023-08-30 – 2024-01-24 (×2): qty 90, 90d supply, fill #0

## 2023-08-30 MED ORDER — TAMSULOSIN HCL 0.4 MG PO CAPS
0.4000 mg | ORAL_CAPSULE | Freq: Every day | ORAL | 3 refills | Status: AC
  Filled 2023-08-30 – 2024-01-24 (×2): qty 30, 30d supply, fill #0

## 2023-08-30 MED ORDER — LOSARTAN POTASSIUM 50 MG PO TABS
50.0000 mg | ORAL_TABLET | Freq: Every evening | ORAL | 1 refills | Status: DC
  Filled 2023-08-30 – 2024-01-24 (×2): qty 90, 90d supply, fill #0
  Filled 2024-05-14: qty 90, 90d supply, fill #1

## 2023-08-30 MED ORDER — ATORVASTATIN CALCIUM 10 MG PO TABS
10.0000 mg | ORAL_TABLET | Freq: Every day | ORAL | 1 refills | Status: AC
  Filled 2023-08-30 – 2024-01-24 (×2): qty 90, 90d supply, fill #0
  Filled 2024-05-14: qty 90, 90d supply, fill #1

## 2023-08-30 NOTE — Progress Notes (Signed)
 Established Patient Office Visit  Subjective   Patient ID: Christopher Harrell, male    DOB: [birth year]  Age: 53 y.o. MRN: [id number]  No chief complaint on file.   HPI  Christopher Harrell is a 53 y/o AA male who is seen for routine follow-up of hypertension, hyperlipidemia, tobacco use disorder and COPD.  His blood pressure is elevated today 156/106 mmHg. Recheck 136/95 mmHg, Hr 95/minute. He states that he takes his medications as prescribed. He checks his blood pressure at home sometime, it remains constantly high. He denies headache, dizziness, blurry vision, chest pain, shortness of breath, edema in ankles. He states that he smokes 1 1/2 packs of cigarette daily, but doesn't have a plan to quit smoking. He states he barely drinks alcohol. His mother and grandmother had history of stroke.  He states that he eats more fruits and less salty and fried foods, he will do exercise (pushing up and walking with a dog) every day. He states he has constant back and leg pain at rate of 7, which he used to the pain. He denies bladder/ bowel incontinence and saddle anesthesia. Overall, he states that he is doing well and offers no further complaint.    Review of Systems  Constitutional: Negative.   HENT: Negative.    Eyes: Negative.   Respiratory: Negative.    Cardiovascular: Negative.   Gastrointestinal: Negative.   Genitourinary: Negative.   Musculoskeletal:  Positive for back pain. Negative for neck pain.  Skin: Negative.   Neurological: Negative.   Psychiatric/Behavioral: Negative.        Objective:     BP (!) 136/95 (BP Location: Left Arm, Patient Position: Sitting, Cuff Size: Large)   Pulse 95   Resp 17   Ht 5\' 8"  (1.727 m)   Wt 176 lb 3.2 oz (79.9 kg)   SpO2 96%   BMI 26.79 kg/m  BP Readings from Last 3 Encounters:  08/30/23 (!) 136/95  06/09/23 (!) 177/127  05/24/23 (!) 167/116   Wt Readings from Last 3 Encounters:  08/30/23 176 lb 3.2 oz (79.9 kg)  06/09/23 174 lb 2.6 oz (79  kg)  05/24/23 175 lb (79.4 kg)     Physical Exam Constitutional:      Appearance: Normal appearance.  HENT:     Head: Normocephalic.  Eyes:     Pupils: Pupils are equal, round, and reactive to light.  Cardiovascular:     Rate and Rhythm: Normal rate and regular rhythm.     Pulses: Normal pulses.     Heart sounds: Normal heart sounds.  Pulmonary:     Effort: Pulmonary effort is normal.     Breath sounds: Normal breath sounds.  Abdominal:     General: Abdomen is flat.     Palpations: Abdomen is soft.  Musculoskeletal:        General: Normal range of motion.     Cervical back: Normal range of motion.  Skin:    General: Skin is warm and dry.  Neurological:     General: No focal deficit present.     Mental Status: He is alert and oriented to person, place, and time.  Psychiatric:        Mood and Affect: Mood normal.        Behavior: Behavior normal.    No results found for any visits on 08/30/23.  Last CBC Lab Results  Component Value Date   WBC 6.8 07/06/2022   HGB 16.6 07/06/2022   HCT 49.4 07/06/2022  MCV 91 07/06/2022   MCH 30.5 07/06/2022   RDW 13.1 07/06/2022   PLT 282 07/06/2022   Last metabolic panel Lab Results  Component Value Date   GLUCOSE 94 07/06/2022   NA 143 07/06/2022   K 3.8 07/06/2022   CL 104 07/06/2022   CO2 23 07/06/2022   BUN 14 07/06/2022   CREATININE 1.11 07/06/2022   EGFR 79 07/06/2022   CALCIUM  10.0 07/06/2022   PROT 7.0 07/06/2022   ALBUMIN 4.6 07/06/2022   LABGLOB 2.4 07/06/2022   AGRATIO 1.9 07/06/2022   BILITOT 1.6 (H) 07/06/2022   ALKPHOS 73 07/06/2022   AST 17 07/06/2022   ALT 16 07/06/2022   ANIONGAP 8 05/31/2021   Last lipids Lab Results  Component Value Date   CHOL 136 07/06/2022   HDL 67 07/06/2022   LDLCALC 42 07/06/2022   LDLDIRECT 138 (H) 06/13/2018   TRIG 169 (H) 07/06/2022   CHOLHDL 2.0 07/06/2022   Last hemoglobin A1c Lab Results  Component Value Date   HGBA1C 5.6 04/04/2022   Last thyroid  functions Lab Results  Component Value Date   TSH 1.110 06/13/2016   Last vitamin D  Lab Results  Component Value Date   VD25OH 26.4 (L) 12/01/2020   Last vitamin B12 and Folate No results found for: "VITAMINB12", "FOLATE"    The 10-year ASCVD risk score (Arnett DK, et al., 2019) is: 28.4%    Assessment & Plan:  1. Health care maintenance (Primary) He will check the below labs for the health care maintenance.  - CBC with Differential/Platelet; Future - Comp Met (CMET); Future - Lipid panel; Future - HgB A1c; Future - Microalbumin / creatinine urine ratio; Future - Microalbumin / creatinine urine ratio - HgB A1c - Lipid panel - Comp Met (CMET) - CBC with Differential/Platelet - Vitamin D  (25 hydroxy)  2. Essential hypertension He  will continue current medication. His blood pressure is improving, was encouraged on DASH diet and exercise as tolerated. He was instructed to go to ED if having severe headache, chest pain, shortness of breath, trouble with speaking and seeing, loss of balance, visual changes.  Will continue his current medication regimen.  - amLODipine  (NORVASC ) 10 MG tablet; Take 1 tablet (10 mg total) by mouth daily.  Dispense: 90 tablet; Refill: 1 - losartan  (COZAAR ) 50 MG tablet; Take 1 tablet (50 mg total) by mouth at bedtime.  Dispense: 90 tablet; Refill: 1 - triamterene -hydrochlorothiazide  (DYAZIDE ) 37.5-25 MG capsule; Take 1 capsule by mouth daily.  Dispense: 90 capsule; Refill: 1  3. Mixed hyperlipidemia He was encouraged to eat low fat/low cholesterol food and exercise as tolerated. Will continue current medicine regimen.Will likely increase Lipitor to 20 to 40 mg at his next visit depends on the results sent to lab today. - atorvastatin  (LIPITOR) 10 MG tablet; Take 1 tablet (10 mg total) by mouth daily.  Dispense: 90 tablet; Refill: 1  4. Nocturia He was encouraged not to drink water late in the evening, and to follow up with his Urology. He will  continue on current medication. - tamsulosin  (FLOMAX ) 0.4 MG CAPS capsule; Take 1 capsule (0.4 mg total) by mouth daily.  Dispense: 30 capsule; Refill: 3  Follow up in 4 weeks (09/27/2023) in clinic for lab review and hypertension evaluation.    Chioma E Iloabachie, NP

## 2023-08-30 NOTE — Progress Notes (Deleted)
   Established Patient Office Visit  Subjective   Patient ID: Christopher Harrell, male    DOB: May 19, 1970  Age: 53 y.o. MRN: [id number]  Chief Complaint  Patient presents with   Hypertension    HPI    ROS    Objective:     BP (!) 151/103 (BP Location: Left Arm, Patient Position: Sitting, Cuff Size: Large)   Pulse 78   Ht 5\' 9"  (1.753 m)   Wt 176 lb 8 oz (80.1 kg)   SpO2 98%   BMI 26.06 kg/m    Physical Exam   Results for orders placed or performed in visit on 05/10/23  Microscopic Examination   Urine  Result Value Ref Range   WBC, UA None seen 0 - 5 /hpf   RBC, Urine None seen 0 - 2 /hpf   Epithelial Cells (non renal) None seen 0 - 10 /hpf   Casts None seen None seen /lpf   Bacteria, UA None seen None seen/Few  UA/M w/rflx Culture, Routine   Specimen: Urine   Urine  Result Value Ref Range   Specific Gravity, UA 1.019 1.005 - 1.030   pH, UA 7.0 5.0 - 7.5   Color, UA Yellow Yellow   Appearance Ur Clear Clear   Leukocytes,UA Negative Negative   Protein,UA Negative Negative/Trace   Glucose, UA Negative Negative   Ketones, UA Negative Negative   RBC, UA Negative Negative   Bilirubin, UA Negative Negative   Urobilinogen, Ur 1.0 0.2 - 1.0 mg/dL   Nitrite, UA Negative Negative   Microscopic Examination Comment    Microscopic Examination See below:    Urinalysis Reflex Comment       The 10-year ASCVD risk score (Arnett DK, et al., 2019) is: 35.3%    Assessment & Plan:   Problem List Items Addressed This Visit       Cardiovascular and Mediastinum   Essential hypertension   Relevant Medications   triamterene -hydrochlorothiazide  (DYAZIDE ) 37.5-25 MG capsule   amLODipine  (NORVASC ) 10 MG tablet   atorvastatin  (LIPITOR) 10 MG tablet   losartan  (COZAAR ) 50 MG tablet   Other Visit Diagnoses       Urinary urgency    -  Primary   Relevant Orders   UA/M w/rflx Culture, Routine (Completed)     Nocturia       Relevant Medications   tamsulosin  (FLOMAX ) 0.4  MG CAPS capsule     Mixed hyperlipidemia       Relevant Medications   triamterene -hydrochlorothiazide  (DYAZIDE ) 37.5-25 MG capsule   amLODipine  (NORVASC ) 10 MG tablet   atorvastatin  (LIPITOR) 10 MG tablet   losartan  (COZAAR ) 50 MG tablet     Chronic bronchitis, unspecified chronic bronchitis type (HCC)         Gastroesophageal reflux disease without esophagitis       Relevant Medications   pantoprazole  (PROTONIX ) 20 MG tablet       Return in about 2 weeks (around 05/24/2023), or if symptoms worsen or fail to improve, for Lab 05/17/23 and F/U 05/24/23.    Debbrah Faith, NP

## 2023-08-30 NOTE — Patient Instructions (Signed)
Managing the Challenge of Quitting Smoking Quitting smoking is a physical and mental challenge. You may have cravings, withdrawal symptoms, and temptation to smoke. Before quitting, work with your health care provider to make a plan that can help you manage quitting. Making a plan before you quit may keep you from smoking when you have the urge to smoke while trying to quit. How to manage lifestyle changes Managing stress Stress can make you want to smoke, and wanting to smoke may cause stress. It is important to find ways to manage your stress. You could try some of the following: Practice relaxation techniques. Breathe slowly and deeply, in through your nose and out through your mouth. Listen to music. Soak in a bath or take a shower. Imagine a peaceful place or vacation. Get some support. Talk with family or friends about your stress. Join a support group. Talk with a counselor or therapist. Get some physical activity. Go for a walk, run, or bike ride. Play a favorite sport. Practice yoga.  Medicines Talk with your health care provider about medicines that might help you deal with cravings and make quitting easier for you. Relationships Social situations can be difficult when you are quitting smoking. To manage this, you can: Avoid parties and other social situations where people might be smoking. Avoid alcohol. Leave right away if you have the urge to smoke. Explain to your family and friends that you are quitting smoking. Ask for support and let them know you might be a bit grumpy. Plan activities where smoking is not an option. General instructions Be aware that many people gain weight after they quit smoking. However, not everyone does. To keep from gaining weight, have a plan in place before you quit, and stick to the plan after you quit. Your plan should include: Eating healthy snacks. When you have a craving, it may help to: Eat popcorn, or try carrots, celery, or other cut  vegetables. Chew sugar-free gum. Changing how you eat. Eat small portion sizes at meals. Eat 4-6 small meals throughout the day instead of 1-2 large meals a day. Be mindful when you eat. You should avoid watching television or doing other things that might distract you as you eat. Exercising regularly. Make time to exercise each day. If you do not have time for a long workout, do short bouts of exercise for 5-10 minutes several times a day. Do some form of strengthening exercise, such as weight lifting. Do some exercise that gets your heart beating and causes you to breathe deeply, such as walking fast, running, swimming, or biking. This is very important. Drinking plenty of water or other low-calorie or no-calorie drinks. Drink enough fluid to keep your urine pale yellow.  How to recognize withdrawal symptoms Your body and mind may experience discomfort as you try to get used to not having nicotine in your system. These effects are called withdrawal symptoms. They may include: Feeling hungrier than normal. Having trouble concentrating. Feeling irritable or restless. Having trouble sleeping. Feeling depressed. Craving a cigarette. These symptoms may surprise you, but they are normal to have when quitting smoking. To manage withdrawal symptoms: Avoid places, people, and activities that trigger your cravings. Remember why you want to quit. Get plenty of sleep. Avoid coffee and other drinks that contain caffeine. These may worsen some of your symptoms. How to manage cravings Come up with a plan for how to deal with your cravings. The plan should include the following: A definition of the specific situation  you want to deal with. An activity or action you will take to replace smoking. A clear idea for how this action will help. The name of someone who could help you with this. Cravings usually last for 5-10 minutes. Consider taking the following actions to help you with your plan to deal  with cravings: Keep your mouth busy. Chew sugar-free gum. Suck on hard candies or a straw. Brush your teeth. Keep your hands and body busy. Change to a different activity right away. Squeeze or play with a ball. Do an activity or a hobby, such as making bead jewelry, practicing needlepoint, or working with wood. Mix up your normal routine. Take a short exercise break. Go for a quick walk, or run up and down stairs. Focus on doing something kind or helpful for someone else. Call a friend or family member to talk during a craving. Join a support group. Contact a quitline. Where to find support To get help or find a support group: Call the National Cancer Institute's Smoking Quitline: 1-800-QUIT-NOW (475) 435-3829) Text QUIT to SmokefreeTXT: 454098 Where to find more information Visit these websites to find more information on quitting smoking: U.S. Department of Health and Human Services: www.smokefree.gov American Lung Association: www.freedomfromsmoking.org Centers for Disease Control and Prevention (CDC): FootballExhibition.com.br American Heart Association: www.heart.org Contact a health care provider if: You want to change your plan for quitting. The medicines you are taking are not helping. Your eating feels out of control or you cannot sleep. You feel depressed or become very anxious. Summary Quitting smoking is a physical and mental challenge. You will face cravings, withdrawal symptoms, and temptation to smoke again. Preparation can help you as you go through these challenges. Try different techniques to manage stress, handle social situations, and prevent weight gain. You can deal with cravings by keeping your mouth busy (such as by chewing gum), keeping your hands and body busy, calling family or friends, or contacting a quitline for people who want to quit smoking. You can deal with withdrawal symptoms by avoiding places where people smoke, getting plenty of rest, and avoiding drinks that  contain caffeine. This information is not intended to replace advice given to you by your health care provider. Make sure you discuss any questions you have with your health care provider. Document Revised: 04/08/2021 Document Reviewed: 04/08/2021 Elsevier Patient Education  2024 Else[employer] Inc. DASH Eating Plan DASH stands for Dietary Approaches to Stop Hypertension. The DASH eating plan is a healthy eating plan that has been shown to: Lower high blood pressure (hypertension). Reduce your risk for type 2 diabetes, heart disease, and stroke. Help with weight loss. What are tips for following this plan? Reading food labels Check food labels for the amount of salt (sodium) per serving. Choose foods with less than 5 percent of the Daily Value (DV) of sodium. In general, foods with less than 300 milligrams (mg) of sodium per serving fit into this eating plan. To find whole grains, look for the word "whole" as the first word in the ingredient list. Shopping Buy products labeled as "low-sodium" or "no salt added." Buy fresh foods. Avoid canned foods and pre-made or frozen meals. Cooking Try not to add salt when you cook. Use salt-free seasonings or herbs instead of table salt or sea salt. Check with your health care provider or pharmacist before using salt substitutes. Do not fry foods. Cook foods in healthy ways, such as baking, boiling, grilling, roasting, or broiling. Cook using oils that are good for your  heart. These include olive, canola, avocado, soybean, and sunflower oil. Meal planning  Eat a balanced diet. This should include: 4 or more servings of fruits and 4 or more servings of vegetables each day. Try to fill half of your plate with fruits and vegetables. 6-8 servings of whole grains each day. 6 or less servings of lean meat, poultry, or fish each day. 1 oz is 1 serving. A 3 oz (85 g) serving of meat is about the same size as the palm of your hand. One egg is 1 oz (28 g). 2-3  servings of low-fat dairy each day. One serving is 1 cup (237 mL). 1 serving of nuts, seeds, or beans 5 times each week. 2-3 servings of heart-healthy fats. Healthy fats called omega-3 fatty acids are found in foods such as walnuts, flaxseeds, fortified milks, and eggs. These fats are also found in cold-water fish, such as sardines, salmon, and mackerel. Limit how much you eat of: Canned or prepackaged foods. Food that is high in trans fat, such as fried foods. Food that is high in saturated fat, such as fatty meat. Desserts and other sweets, sugary drinks, and other foods with added sugar. Full-fat dairy products. Do not salt foods before eating. Do not eat more than 4 egg yolks a week. Try to eat at least 2 vegetarian meals a week. Eat more home-cooked food and less restaurant, buffet, and fast food. Lifestyle When eating at a restaurant, ask if your food can be made with less salt or no salt. If you drink alcohol: Limit how much you have to: 0-1 drink a day if you are male. 0-2 drinks a day if you are male. Know how much alcohol is in your drink. In the [place] one drink is one 12 oz bottle of beer (355 mL), one 5 oz glass of wine (148 mL), or one 1 oz glass of hard liquor (44 mL). General information Avoid eating more than 2,300 mg of salt a day. If you have hypertension, you may need to reduce your sodium intake to 1,500 mg a day. Work with your provider to stay at a healthy body weight or lose weight. Ask what the best weight range is for you. On most days of the week, get at least 30 minutes of exercise that causes your heart to beat faster. This may include walking, swimming, or biking. Work with your provider or dietitian to adjust your eating plan to meet your specific calorie needs. What foods should I eat? Fruits All fresh, dried, or frozen fruit. Canned fruits that are in their natural juice and do not have sugar added to them. Vegetables Fresh or frozen vegetables that are  raw, steamed, roasted, or grilled. Low-sodium or reduced-sodium tomato and vegetable juice. Low-sodium or reduced-sodium tomato sauce and tomato paste. Low-sodium or reduced-sodium canned vegetables. Grains Whole-grain or whole-wheat bread. Whole-grain or whole-wheat pasta. Brown rice. Orpah Cobb. Bulgur. Whole-grain and low-sodium cereals. Pita bread. Low-fat, low-sodium crackers. Whole-wheat flour tortillas. Meats and other proteins Skinless chicken or [place]. Ground chicken or [place]. Pork with fat trimmed off. Fish and seafood. Egg whites. Dried beans, peas, or lentils. Unsalted nuts, nut butters, and seeds. Unsalted canned beans. Lean cuts of beef with fat trimmed off. Low-sodium, lean precooked or cured meat, such as sausages or meat loaves. Dairy Low-fat (1%) or fat-free (skim) milk. Reduced-fat, low-fat, or fat-free cheeses. Nonfat, low-sodium ricotta or cottage cheese. Low-fat or nonfat yogurt. Low-fat, low-sodium cheese. Fats and oils Soft margarine without trans fats.  Vegetable oil. Reduced-fat, low-fat, or light mayonnaise and salad dressings (reduced-sodium). Canola, safflower, olive, avocado, soybean, and sunflower oils. Avocado. Seasonings and condiments Herbs. Spices. Seasoning mixes without salt. Other foods Unsalted popcorn and pretzels. Fat-free sweets. The items listed above may not be all the foods and drinks you can have. Talk to a dietitian to learn more. What foods should I avoid? Fruits Canned fruit in a light or heavy syrup. Fried fruit. Fruit in cream or butter sauce. Vegetables Creamed or fried vegetables. Vegetables in a cheese sauce. Regular canned vegetables that are not marked as low-sodium or reduced-sodium. Regular canned tomato sauce and paste that are not marked as low-sodium or reduced-sodium. Regular tomato and vegetable juices that are not marked as low-sodium or reduced-sodium. Rosita Fire. Olives. Grains Baked goods made with fat, such as croissants,  muffins, or some breads. Dry pasta or rice meal packs. Meats and other proteins Fatty cuts of meat. Ribs. Fried meat. Tomasa Blase. Bologna, salami, and other precooked or cured meats, such as sausages or meat loaves, that are not lean and low in sodium. Fat from the back of a pig (fatback). Bratwurst. Salted nuts and seeds. Canned beans with added salt. Canned or smoked fish. Whole eggs or egg yolks. Chicken or [place] with skin. Dairy Whole or 2% milk, cream, and half-and-half. Whole or full-fat cream cheese. Whole-fat or sweetened yogurt. Full-fat cheese. Nondairy creamers. Whipped toppings. Processed cheese and cheese spreads. Fats and oils Butter. Stick margarine. Lard. Shortening. Ghee. Bacon fat. Tropical oils, such as coconut, palm kernel, or palm oil. Seasonings and condiments Onion salt, garlic salt, seasoned salt, table salt, and sea salt. Worcestershire sauce. Tartar sauce. Barbecue sauce. Teriyaki sauce. Soy sauce, including reduced-sodium soy sauce. Steak sauce. Canned and packaged gravies. Fish sauce. Oyster sauce. Cocktail sauce. Store-bought horseradish. Ketchup. Mustard. Meat flavorings and tenderizers. Bouillon cubes. Hot sauces. Pre-made or packaged marinades. Pre-made or packaged taco seasonings. Relishes. Regular salad dressings. Other foods Salted popcorn and pretzels. The items listed above may not be all the foods and drinks you should avoid. Talk to a dietitian to learn more. Where to find more information National Heart, Lung, and Blood Institute (NHLBI): BuffaloDryCleaner.gl American Heart Association (AHA): heart.org Academy of Nutrition and Dietetics: eatright.org National Kidney Foundation (NKF): kidney.org This information is not intended to replace advice given to you by your health care provider. Make sure you discuss any questions you have with your health care provider. Document Revised: 05/04/2022 Document Reviewed: 05/04/2022 Elsevier Patient Education  2024 [employer].

## 2023-08-30 NOTE — Progress Notes (Signed)
 Established Patient Office Visit  Subjective   Patient ID: Christopher Harrell, male    DOB: April 20, 1971  Age: 53 y.o. MRN: [id number]  Chief Complaint  Patient presents with   Hypertension    HPI  Christopher Harrell is a 53 y/o AA male who is seen for routine follow-up of hypertension, hyperlipidemia, tobacco use disorder and COPD who presents today for follow up visit, medication refill and lab review. His blood pressure is elevated today 167/103. Recheck 146/104. States he takes his medications as prescribed. He states he checks his blood pressure at home but has not check it this week. it remains constantly high. States he eats out a lot, salty and fried foods. Denies any headaches, dizziness, blurry vision, nausea or vomiting. Patient is complaining today of increased back, leg and ankle pain which is more intense at night when he gets up to use the bathroom. Rates the back pain as 8 and leg pain at 7. The pain is worst at rest. He describes it as constant sharp pain that radiates down to his legs. He also reports having low energy, not been able to work for 1 week, and its affecting his sleeping pattern. He reports constant lifting and bending at work which aggravates his pain. He denies any numbness or tingling., bladder/bowel incontinence. Had a recent Ed visit on 07/25/21 due to the back pain- He was given a prescription for meloxicam  and robaxin  which he is currently taking. States he had some relieved with  them but still has pain. Lumbar x ray done showed Facet degenerative changes lower lumbar spine.   No acute osseous abnormalities. He also had recent dental exam and he prescribed antibiotic for tooth abscess. He has return visit with dentist in May. Patient states his tooth feels better. Overall, he states that he's doing well and offer no further complaint  Bp 154/106 in clinical. He states his BP always runs high, he denies headache, dizziness, chest pain, short of breath, no edema on  ankles. Drink water, He refusal to get more medications., by saying that" don't bother me". He is smoker, but does have a plan to quit smoking. Family history of stroke on his mother and grandmother. He states he will doing exercise and drink water and cut down fatty food.   ROS    Objective:     BP (!) 151/103 (BP Location: Left Arm, Patient Position: Sitting, Cuff Size: Large)   Pulse 78   Ht 5\' 9"  (1.753 m)   Wt 176 lb 8 oz (80.1 kg)   SpO2 98%   BMI 26.06 kg/m    Physical Exam   Results for orders placed or performed in visit on 05/10/23  Microscopic Examination   Urine  Result Value Ref Range   WBC, UA None seen 0 - 5 /hpf   RBC, Urine None seen 0 - 2 /hpf   Epithelial Cells (non renal) None seen 0 - 10 /hpf   Casts None seen None seen /lpf   Bacteria, UA None seen None seen/Few  UA/M w/rflx Culture, Routine   Specimen: Urine   Urine  Result Value Ref Range   Specific Gravity, UA 1.019 1.005 - 1.030   pH, UA 7.0 5.0 - 7.5   Color, UA Yellow Yellow   Appearance Ur Clear Clear   Leukocytes,UA Negative Negative   Protein,UA Negative Negative/Trace   Glucose, UA Negative Negative   Ketones, UA Negative Negative   RBC, UA Negative Negative   Bilirubin, UA Negative  Negative   Urobilinogen, Ur 1.0 0.2 - 1.0 mg/dL   Nitrite, UA Negative Negative   Microscopic Examination Comment    Microscopic Examination See below:    Urinalysis Reflex Comment       The 10-year ASCVD risk score (Arnett DK, et al., 2019) is: 35.3%    Assessment & Plan:   Problem List Items Addressed This Visit       Cardiovascular and Mediastinum   Essential hypertension   Relevant Medications   triamterene -hydrochlorothiazide  (DYAZIDE ) 37.5-25 MG capsule   amLODipine  (NORVASC ) 10 MG tablet   atorvastatin  (LIPITOR) 10 MG tablet   losartan  (COZAAR ) 50 MG tablet   Other Visit Diagnoses       Urinary urgency    -  Primary   Relevant Orders   UA/M w/rflx Culture, Routine (Completed)      Nocturia       Relevant Medications   tamsulosin  (FLOMAX ) 0.4 MG CAPS capsule     Mixed hyperlipidemia       Relevant Medications   triamterene -hydrochlorothiazide  (DYAZIDE ) 37.5-25 MG capsule   amLODipine  (NORVASC ) 10 MG tablet   atorvastatin  (LIPITOR) 10 MG tablet   losartan  (COZAAR ) 50 MG tablet     Chronic bronchitis, unspecified chronic bronchitis type (HCC)         Gastroesophageal reflux disease without esophagitis       Relevant Medications   pantoprazole  (PROTONIX ) 20 MG tablet       Return in about 2 weeks (around 05/24/2023), or if symptoms worsen or fail to improve, for Lab 05/17/23 and F/U 05/24/23.    Debbrah Faith, NP

## 2023-08-30 NOTE — Addendum Note (Signed)
 Addended by: Debbrah Faith on: 08/30/2023 12:34 PM   Modules accepted: Orders

## 2023-08-31 LAB — CBC WITH DIFFERENTIAL/PLATELET
Basophils Absolute: 0.1 10*3/uL (ref 0.0–0.2)
Basos: 1 %
EOS (ABSOLUTE): 0.1 10*3/uL (ref 0.0–0.4)
Eos: 2 %
Hematocrit: 49.3 % (ref 37.5–51.0)
Hemoglobin: 16.9 g/dL (ref 13.0–17.7)
Immature Grans (Abs): 0 10*3/uL (ref 0.0–0.1)
Immature Granulocytes: 1 %
Lymphocytes Absolute: 1.6 10*3/uL (ref 0.7–3.1)
Lymphs: 30 %
MCH: 31 pg (ref 26.6–33.0)
MCHC: 34.3 g/dL (ref 31.5–35.7)
MCV: 90 fL (ref 79–97)
Monocytes Absolute: 0.5 10*3/uL (ref 0.1–0.9)
Monocytes: 10 %
Neutrophils Absolute: 3 10*3/uL (ref 1.4–7.0)
Neutrophils: 56 %
Platelets: 277 10*3/uL (ref 150–450)
RBC: 5.46 x10E6/uL (ref 4.14–5.80)
RDW: 13.2 % (ref 11.6–15.4)
WBC: 5.3 10*3/uL (ref 3.4–10.8)

## 2023-08-31 LAB — COMPREHENSIVE METABOLIC PANEL WITH GFR
ALT: 19 IU/L (ref 0–44)
AST: 20 IU/L (ref 0–40)
Albumin: 4.4 g/dL (ref 3.8–4.9)
Alkaline Phosphatase: 78 IU/L (ref 44–121)
BUN/Creatinine Ratio: 14 (ref 9–20)
BUN: 13 mg/dL (ref 6–24)
Bilirubin Total: 0.5 mg/dL (ref 0.0–1.2)
CO2: 22 mmol/L (ref 20–29)
Calcium: 9.8 mg/dL (ref 8.7–10.2)
Chloride: 103 mmol/L (ref 96–106)
Creatinine, Ser: 0.92 mg/dL (ref 0.76–1.27)
Globulin, Total: 2.6 g/dL (ref 1.5–4.5)
Glucose: 90 mg/dL (ref 70–99)
Potassium: 4.5 mmol/L (ref 3.5–5.2)
Sodium: 140 mmol/L (ref 134–144)
Total Protein: 7 g/dL (ref 6.0–8.5)
eGFR: 99 mL/min/{1.73_m2} (ref >59)

## 2023-08-31 LAB — VITAMIN D 25 HYDROXY (VIT D DEFICIENCY, FRACTURES): Vit D, 25-Hydroxy: 35.7 ng/mL (ref 30.0–100.0)

## 2023-08-31 LAB — LIPID PANEL
Chol/HDL Ratio: 1.7 ratio (ref 0.0–5.0)
Cholesterol, Total: 148 mg/dL (ref 100–199)
HDL: 85 mg/dL (ref >39)
LDL Chol Calc (NIH): 50 mg/dL (ref 0–99)
Triglycerides: 67 mg/dL (ref 0–149)
VLDL Cholesterol Cal: 13 mg/dL (ref 5–40)

## 2023-08-31 LAB — HEMOGLOBIN A1C
Est. average glucose Bld gHb Est-mCnc: 114 mg/dL
Hgb A1c MFr Bld: 5.6 % (ref 4.8–5.6)

## 2023-08-31 LAB — MICROALBUMIN / CREATININE URINE RATIO
Creatinine, Urine: 94.9 mg/dL
Microalb/Creat Ratio: 23 mg/g{creat} (ref 0–29)
Microalbumin, Urine: 21.7 ug/mL

## 2023-09-11 ENCOUNTER — Other Ambulatory Visit: Payer: Self-pay

## 2023-11-13 ENCOUNTER — Telehealth: Payer: Self-pay | Admitting: Gerontology

## 2023-11-13 NOTE — Telephone Encounter (Signed)
 Called back after he left a voicemail, we were able to schedule an appointment for next Tuesday, 7/22.

## 2023-11-20 ENCOUNTER — Ambulatory Visit: Payer: Self-pay | Admitting: Gerontology

## 2023-12-17 ENCOUNTER — Other Ambulatory Visit: Payer: Self-pay | Admitting: Urology

## 2023-12-17 DIAGNOSIS — R351 Nocturia: Secondary | ICD-10-CM

## 2024-01-24 ENCOUNTER — Ambulatory Visit: Payer: Self-pay | Admitting: Gerontology

## 2024-01-24 ENCOUNTER — Other Ambulatory Visit (HOSPITAL_COMMUNITY): Payer: Self-pay

## 2024-01-24 ENCOUNTER — Other Ambulatory Visit: Payer: Self-pay

## 2024-01-24 ENCOUNTER — Encounter: Payer: Self-pay | Admitting: Gerontology

## 2024-01-24 VITALS — BP 162/113 | HR 88 | Ht 69.0 in | Wt 182.4 lb

## 2024-01-24 DIAGNOSIS — Z720 Tobacco use: Secondary | ICD-10-CM

## 2024-01-24 DIAGNOSIS — G8929 Other chronic pain: Secondary | ICD-10-CM

## 2024-01-24 DIAGNOSIS — I10 Essential (primary) hypertension: Secondary | ICD-10-CM

## 2024-01-24 NOTE — Progress Notes (Signed)
 Established Patient Office Visit  Subjective   Patient ID: Christopher Harrell, male    DOB: 07-Jul-1970  Age: 53 y.o. MRN: [id number]  Chief Complaint  Patient presents with   Follow-up    HPI  Christopher Harrell is a 53 y/o AA male who is seen for routine follow-up of hypertension, hyperlipidemia, tobacco use disorder and COPD. He continues to smoke more than 1/2 pack  of cigarette daily and admits the desire to quit.  His blood pressure was elevated during visit 162/128 and 162/113 after it was rechecked. He was out of his medicines for more than 2 months ,states that he doesn't check his blood pressure at home, He denies chest pain, palpitation, shortness of breath, light headeness and vision changes. Continues to experience chronic back pain and bilateral ankle pain, states that it's constant 8/10. Denies muscle nor motor weakness, urinary/bowel incontinence and saddle anesthesia. Overall, he states that he's doing well and offers no further complaints.  Review of Systems  Constitutional: Negative.   Eyes: Negative.   Respiratory: Negative.    Cardiovascular: Negative.   Neurological: Negative.   Endo/Heme/Allergies: Negative.   Psychiatric/Behavioral: Negative.        Objective:     BP (!) 162/113 (BP Location: Right Arm, Patient Position: Sitting, Cuff Size: Large)   Pulse 88   Ht 5' 9 (1.753 m)   Wt 182 lb 6.4 oz (82.7 kg)   SpO2 95%   BMI 26.94 kg/m  BP Readings from Last 3 Encounters:  01/24/24 (!) 162/113  08/30/23 (!) 136/95  06/09/23 (!) 177/127   Wt Readings from Last 3 Encounters:  01/24/24 182 lb 6.4 oz (82.7 kg)  08/30/23 176 lb 3.2 oz (79.9 kg)  06/09/23 174 lb 2.6 oz (79 kg)      Physical Exam HENT:     Head: Normocephalic and atraumatic.     Mouth/Throat:     Mouth: Mucous membranes are moist.     Pharynx: Oropharynx is clear.  Eyes:     Extraocular Movements: Extraocular movements intact.     Pupils: Pupils are equal, round, and reactive to  light.  Cardiovascular:     Rate and Rhythm: Normal rate and regular rhythm.     Pulses: Normal pulses.     Heart sounds: Normal heart sounds.  Pulmonary:     Effort: Pulmonary effort is normal.     Breath sounds: Normal breath sounds.  Skin:    General: Skin is warm.  Neurological:     General: No focal deficit present.     Mental Status: He is alert and oriented to person, place, and time.  Psychiatric:        Mood and Affect: Mood normal.        Behavior: Behavior normal.      No results found for any visits on 01/24/24.  Last CBC Lab Results  Component Value Date   WBC 5.3 08/30/2023   HGB 16.9 08/30/2023   HCT 49.3 08/30/2023   MCV 90 08/30/2023   MCH 31.0 08/30/2023   RDW 13.2 08/30/2023   PLT 277 08/30/2023   Last metabolic panel Lab Results  Component Value Date   GLUCOSE 90 08/30/2023   NA 140 08/30/2023   K 4.5 08/30/2023   CL 103 08/30/2023   CO2 22 08/30/2023   BUN 13 08/30/2023   CREATININE 0.92 08/30/2023   EGFR 99 08/30/2023   CALCIUM  9.8 08/30/2023   PROT 7.0 08/30/2023   ALBUMIN 4.4 08/30/2023  LABGLOB 2.6 08/30/2023   AGRATIO 1.9 07/06/2022   BILITOT 0.5 08/30/2023   ALKPHOS 78 08/30/2023   AST 20 08/30/2023   ALT 19 08/30/2023   ANIONGAP 8 05/31/2021   Last lipids Lab Results  Component Value Date   CHOL 148 08/30/2023   HDL 85 08/30/2023   LDLCALC 50 08/30/2023   LDLDIRECT 138 (H) 06/13/2018   TRIG 67 08/30/2023   CHOLHDL 1.7 08/30/2023   Last hemoglobin A1c Lab Results  Component Value Date   HGBA1C 5.6 08/30/2023   Last thyroid functions Lab Results  Component Value Date   TSH 1.110 06/13/2016   Last vitamin D  Lab Results  Component Value Date   VD25OH 35.7 08/30/2023   Last vitamin B12 and Folate No results found for: VITAMINB12, FOLATE    The 10-year ASCVD risk score (Arnett DK, et al., 2019) is: 36%    Assessment & Plan:    1. Essential hypertension (Primary) - His blood pressure is not under  controlled, not taking medication for at least 2 months. He was advised to pick up medication from Pharmacy, take as prescribed,  -Low salt DASH diet -Take medications regularly on time -Exercise regularly as tolerated -Check blood pressure at least once a week at home or a nearby pharmacy and record, bring to follow up appointment. Verbalized understanding.   2. Chronic midline low back pain with bilateral sciatica - He was advised to take 500 mg tylenol  every 12 hours, and notify clinic for go to the ED for worsening symptoms. He will follow up with [facility] Orthopedic Dr Kathlynn.  3. Tobacco abuse - He was strongly encouraged on smoking cessation,he states that he will work on cutting back.   Return in about 3 weeks (around 02/14/2024), or if symptoms worsen or fail to improve.    Chelsea Pedretti E Sianne Tejada, NP

## 2024-01-24 NOTE — Patient Instructions (Signed)

## 2024-01-30 ENCOUNTER — Other Ambulatory Visit: Payer: Self-pay | Admitting: Urology

## 2024-01-30 DIAGNOSIS — R351 Nocturia: Secondary | ICD-10-CM

## 2024-02-14 ENCOUNTER — Ambulatory Visit: Payer: Self-pay | Admitting: Gerontology

## 2024-02-19 ENCOUNTER — Ambulatory Visit: Payer: Self-pay | Admitting: Gerontology

## 2024-02-27 ENCOUNTER — Other Ambulatory Visit: Payer: Self-pay

## 2024-02-27 ENCOUNTER — Emergency Department
Admission: EM | Admit: 2024-02-27 | Discharge: 2024-02-27 | Disposition: A | Discharge status: Home / Self Care | Payer: Self-pay | Attending: Emergency Medicine | Admitting: Emergency Medicine

## 2024-02-27 DIAGNOSIS — M549 Dorsalgia, unspecified: Secondary | ICD-10-CM | POA: Diagnosis present

## 2024-02-27 DIAGNOSIS — M62830 Muscle spasm of back: Secondary | ICD-10-CM | POA: Diagnosis not present

## 2024-02-27 MED ORDER — METHOCARBAMOL 500 MG PO TABS
500.0000 mg | ORAL_TABLET | Freq: Three times a day (TID) | ORAL | 1 refills | Status: DC | PRN
  Filled 2024-02-27: qty 20, 7d supply, fill #0

## 2024-02-27 MED ORDER — KETOROLAC TROMETHAMINE 30 MG/ML IJ SOLN
30.0000 mg | Freq: Once | INTRAMUSCULAR | Status: AC
  Administered 2024-02-27: 30 mg via INTRAMUSCULAR
  Filled 2024-02-27: qty 1

## 2024-02-27 MED ORDER — NAPROXEN 500 MG PO TABS
500.0000 mg | ORAL_TABLET | Freq: Two times a day (BID) | ORAL | 2 refills | Status: DC
  Filled 2024-02-27: qty 20, 10d supply, fill #0
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
## 2024-02-28 ENCOUNTER — Ambulatory Visit: Payer: Self-pay | Admitting: Gerontology

## 2024-02-28 ENCOUNTER — Encounter: Payer: Self-pay | Admitting: Gerontology

## 2024-02-28 ENCOUNTER — Other Ambulatory Visit: Payer: Self-pay

## 2024-02-28 VITALS — BP 169/102 | HR 79 | Ht 69.0 in | Wt 186.0 lb

## 2024-02-28 DIAGNOSIS — M549 Dorsalgia, unspecified: Secondary | ICD-10-CM | POA: Insufficient documentation

## 2024-02-28 DIAGNOSIS — M5489 Other dorsalgia: Secondary | ICD-10-CM

## 2024-02-28 DIAGNOSIS — I10 Essential (primary) hypertension: Secondary | ICD-10-CM

## 2024-02-28 MED ORDER — IBUPROFEN 600 MG PO TABS: 600.0000 mg | ORAL_TABLET | Freq: Two times a day (BID) | ORAL | 0 refills | Status: AC

## 2024-02-28 MED ORDER — IBUPROFEN 600 MG PO TABS
600.0000 mg | ORAL_TABLET | Freq: Two times a day (BID) | ORAL | 0 refills | Status: DC
  Filled 2024-02-28: qty 30, 15d supply, fill #0

## 2024-02-28 MED ORDER — CYCLOBENZAPRINE HCL 5 MG PO TABS
5.0000 mg | ORAL_TABLET | Freq: Three times a day (TID) | ORAL | 0 refills | Status: DC | PRN
  Filled 2024-02-28: qty 30, 10d supply, fill #0

## 2024-02-28 MED ORDER — CYCLOBENZAPRINE HCL 5 MG PO TABS
5.0000 mg | ORAL_TABLET | Freq: Three times a day (TID) | ORAL | 0 refills | Status: AC | PRN
Start: 2024-02-28 — End: ?

## 2024-02-28 NOTE — Patient Instructions (Signed)
 Low Back Sprain or Strain Rehab Ask your health care provider which exercises are safe for you. Do exercises exactly as told by your health care provider and adjust them as directed. It is normal to feel mild stretching, pulling, tightness, or discomfort as you do these exercises. Stop right away if you feel sudden pain or your pain gets worse. Do not begin these exercises until told by your health care provider. Stretching and range-of-motion exercises These exercises warm up your muscles and joints and improve the movement and flexibility of your back. These exercises also help to relieve pain, numbness, and tingling. Lumbar rotation  Lie on your back on a firm bed or the floor with your knees bent. Straighten your arms out to your sides so each arm forms a 90-degree angle (right angle) with a side of your body. Slowly move (rotate) both of your knees to one side of your body until you feel a stretch in your lower back (lumbar). Try not to let your shoulders lift off the floor. Hold this position for __________ seconds. Tense your abdominal muscles and slowly move your knees back to the starting position. Repeat this exercise on the other side of your body. Repeat __________ times. Complete this exercise __________ times a day. Single knee to chest  Lie on your back on a firm bed or the floor with both legs straight. Bend one of your knees. Use your hands to move your knee up toward your chest until you feel a gentle stretch in your lower back and buttock. Hold your leg in this position by holding on to the front of your knee. Keep your other leg as straight as possible. Hold this position for __________ seconds. Slowly return to the starting position. Repeat with your other leg. Repeat __________ times. Complete this exercise __________ times a day. Prone extension on elbows  Lie on your abdomen on a firm bed or the floor (prone position). Prop yourself up on your elbows. Use your arms  to help lift your chest up until you feel a gentle stretch in your abdomen and your lower back. This will place some of your body weight on your elbows. If this is uncomfortable, try stacking pillows under your chest. Your hips should stay down, against the surface that you are lying on. Keep your hip and back muscles relaxed. Hold this position for __________ seconds. Slowly relax your upper body and return to the starting position. Repeat __________ times. Complete this exercise __________ times a day. Strengthening exercises These exercises build strength and endurance in your back. Endurance is the ability to use your muscles for a long time, even after they get tired. Pelvic tilt This exercise strengthens the muscles that lie deep in the abdomen. Lie on your back on a firm bed or the floor with your legs extended. Bend your knees so they are pointing toward the ceiling and your feet are flat on the floor. Tighten your lower abdominal muscles to press your lower back against the floor. This motion will tilt your pelvis so your tailbone points up toward the ceiling instead of pointing to your feet or the floor. To help with this exercise, you may place a small towel under your lower back and try to push your back into the towel. Hold this position for __________ seconds. Let your muscles relax completely before you repeat this exercise. Repeat __________ times. Complete this exercise __________ times a day. Alternating arm and leg raises  Get on your hands  and knees on a firm surface. If you are on a hard floor, you may want to use padding, such as an exercise mat, to cushion your knees. Line up your arms and legs. Your hands should be directly below your shoulders, and your knees should be directly below your hips. Lift your left leg behind you. At the same time, raise your right arm and straighten it in front of you. Do not lift your leg higher than your hip. Do not lift your arm higher  than your shoulder. Keep your abdominal and back muscles tight. Keep your hips facing the ground. Do not arch your back. Keep your balance carefully, and do not hold your breath. Hold this position for __________ seconds. Slowly return to the starting position. Repeat with your right leg and your left arm. Repeat __________ times. Complete this exercise __________ times a day. Abdominal set with straight leg raise  Lie on your back on a firm bed or the floor. Bend one of your knees and keep your other leg straight. Tense your abdominal muscles and lift your straight leg up, 4-6 inches (10-15 cm) off the ground. Keep your abdominal muscles tight and hold this position for __________ seconds. Do not hold your breath. Do not arch your back. Keep it flat against the ground. Keep your abdominal muscles tense as you slowly lower your leg back to the starting position. Repeat with your other leg. Repeat __________ times. Complete this exercise __________ times a day. Single leg lower with bent knees Lie on your back on a firm bed or the floor. Tense your abdominal muscles and lift your feet off the floor, one foot at a time, so your knees and hips are bent in 90-degree angles (right angles). Your knees should be over your hips and your lower legs should be parallel to the floor. Keeping your abdominal muscles tense and your knee bent, slowly lower one of your legs so your toe touches the ground. Lift your leg back up to return to the starting position. Do not hold your breath. Do not let your back arch. Keep your back flat against the ground. Repeat with your other leg. Repeat __________ times. Complete this exercise __________ times a day. Posture and body mechanics Good posture and healthy body mechanics can help to relieve stress in your body's tissues and joints. Body mechanics refers to the movements and positions of your body while you do your daily activities. Posture is part of body  mechanics. Good posture means: Your spine is in its natural S-curve position (neutral). Your shoulders are pulled back slightly. Your head is not tipped forward (neutral). Follow these guidelines to improve your posture and body mechanics in your everyday activities. Standing  When standing, keep your spine neutral and your feet about hip-width apart. Keep a slight bend in your knees. Your ears, shoulders, and hips should line up. When you do a task in which you stand in one place for a long time, place one foot up on a stable object that is 2-4 inches (5-10 cm) high, such as a footstool. This helps keep your spine neutral. Sitting  When sitting, keep your spine neutral and keep your feet flat on the floor. Use a footrest, if necessary, and keep your thighs parallel to the floor. Avoid rounding your shoulders, and avoid tilting your head forward. When working at a desk or a computer, keep your desk at a height where your hands are slightly lower than your elbows. Slide your  chair under your desk so you are close enough to maintain good posture. When working at a computer, place your monitor at a height where you are looking straight ahead and you do not have to tilt your head forward or downward to look at the screen. Resting When lying down and resting, avoid positions that are most painful for you. If you have pain with activities such as sitting, bending, stooping, or squatting, lie in a position in which your body does not bend very much. For example, avoid curling up on your side with your arms and knees near your chest (fetal position). If you have pain with activities such as standing for a long time or reaching with your arms, lie with your spine in a neutral position and bend your knees slightly. Try the following positions: Lying on your side with a pillow between your knees. Lying on your back with a pillow under your knees. Lifting  When lifting objects, keep your feet at least  shoulder-width apart and tighten your abdominal muscles. Bend your knees and hips and keep your spine neutral. It is important to lift using the strength of your legs, not your back. Do not lock your knees straight out. Always ask for help to lift heavy or awkward objects. This information is not intended to replace advice given to you by your health care provider. Make sure you discuss any questions you have with your health care provider. Document Revised: 08/21/2022 Document Reviewed: 07/05/2020 Elsevier Patient Education  2024 [employer].
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
## 2024-03-08 ENCOUNTER — Other Ambulatory Visit: Payer: Self-pay

## 2024-03-13 ENCOUNTER — Other Ambulatory Visit: Payer: Self-pay

## 2024-03-13 ENCOUNTER — Ambulatory Visit: Payer: Self-pay | Admitting: Gerontology

## 2024-03-13 VITALS — BP 168/108 | HR 77

## 2024-03-13 DIAGNOSIS — I10 Essential (primary) hypertension: Secondary | ICD-10-CM

## 2024-03-13 DIAGNOSIS — Z720 Tobacco use: Secondary | ICD-10-CM | POA: Insufficient documentation

## 2024-03-13 MED ORDER — BLOOD PRESSURE MONITOR DIGITAL KIT
1.0000 | PACK | Freq: Every day | 0 refills | Status: AC
  Filled 2024-03-13: qty 1, fill #0

## 2024-03-13 NOTE — Progress Notes (Signed)
 Established Patient Office Visit  Subjective   Patient ID: Christopher Harrell, male    DOB: 12-15-1970  Age: 53 y.o. MRN: [id number]  Chief Complaint  Patient presents with   Hypertension    HPI  Christopher Harrell is a 53 y/o AA male who is seen for routine follow-up of hypertension, hyperlipidemia, tobacco use disorder and COPD, presents for uncontrolled Hypertension. His blood pressure was elevated, and was 168/108 when rechecked.  States that he didn't take his medications prior to clinic visit.  He states that he is unable to check his blood pressure at home.  His unconfirmed EKG was normal.  He denies chest pain, palpitation, shortness of breath and vision changes.  He endorses that he continues to smoke half pack of cigarettes daily has been trying to quit.  Overall, he states that he is doing well and offers no further complaint.  Review of Systems  Constitutional: Negative.   Respiratory: Negative.    Cardiovascular: Negative.   Skin: Negative.   Neurological: Negative.   Psychiatric/Behavioral: Negative.        Objective:     BP (!) 168/108 (BP Location: Right Arm, Patient Position: Sitting, Cuff Size: Large)   Pulse 77   SpO2 96%  BP Readings from Last 3 Encounters:  03/13/24 (!) 168/108  02/28/24 (!) 169/102  02/27/24 (!) 161/116   Wt Readings from Last 3 Encounters:  02/28/24 186 lb (84.4 kg)  02/27/24 182 lb (82.6 kg)  01/24/24 182 lb 6.4 oz (82.7 kg)      Physical Exam HENT:     Head: Normocephalic and atraumatic.     Mouth/Throat:     Mouth: Mucous membranes are moist.     Pharynx: Oropharynx is clear.  Eyes:     Extraocular Movements: Extraocular movements intact.     Pupils: Pupils are equal, round, and reactive to light.  Cardiovascular:     Rate and Rhythm: Normal rate and regular rhythm.     Pulses: Normal pulses.     Heart sounds: Normal heart sounds.  Pulmonary:     Effort: Pulmonary effort is normal.     Breath sounds: Normal breath sounds.   Skin:    General: Skin is warm.  Neurological:     General: No focal deficit present.     Mental Status: He is alert and oriented to person, place, and time.  Psychiatric:        Mood and Affect: Mood normal.        Behavior: Behavior normal.      No results found for any visits on 03/13/24.  Last CBC Lab Results  Component Value Date   WBC 5.3 08/30/2023   HGB 16.9 08/30/2023   HCT 49.3 08/30/2023   MCV 90 08/30/2023   MCH 31.0 08/30/2023   RDW 13.2 08/30/2023   PLT 277 08/30/2023   Last metabolic panel Lab Results  Component Value Date   GLUCOSE 90 08/30/2023   NA 140 08/30/2023   K 4.5 08/30/2023   CL 103 08/30/2023   CO2 22 08/30/2023   BUN 13 08/30/2023   CREATININE 0.92 08/30/2023   EGFR 99 08/30/2023   CALCIUM  9.8 08/30/2023   PROT 7.0 08/30/2023   ALBUMIN 4.4 08/30/2023   LABGLOB 2.6 08/30/2023   AGRATIO 1.9 07/06/2022   BILITOT 0.5 08/30/2023   ALKPHOS 78 08/30/2023   AST 20 08/30/2023   ALT 19 08/30/2023   ANIONGAP 8 05/31/2021   Last lipids Lab Results  Component Value  Date   CHOL 148 08/30/2023   HDL 85 08/30/2023   LDLCALC 50 08/30/2023   LDLDIRECT 138 (H) 06/13/2018   TRIG 67 08/30/2023   CHOLHDL 1.7 08/30/2023   Last hemoglobin A1c Lab Results  Component Value Date   HGBA1C 5.6 08/30/2023   Last thyroid functions Lab Results  Component Value Date   TSH 1.110 06/13/2016   Last vitamin D  Lab Results  Component Value Date   VD25OH 35.7 08/30/2023   Last vitamin B12 and Folate No results found for: VITAMINB12, FOLATE    The 10-year ASCVD risk score (Arnett DK, et al., 2019) is: 38%    Assessment & Plan:   1. Essential hypertension (Primary) - His blood pressure is not under control.  He agreed to continue on his current medication, provided him with blood pressure machine and advised to check daily, record and bring the log to follow-up appointment.  He was educated on the signs and symptoms of stroke, verbalized  understanding and advised to go to the emergency room. - Blood Pressure Monitoring (BLOOD PRESSURE MONITOR DIGITAL) KIT; 1 kit by Does not apply route daily at 2 PM.  Dispense: 1 kit; Refill: 0  2. Smoking trying to quit -He was encouraged on smoking cessation, was provided with [place] quit line information and advised to call.   Return in about 12 days (around 03/25/2024), or if symptoms worsen or fail to improve.    Alcie Runions E Tyresse Jayson, NP

## 2024-03-13 NOTE — Patient Instructions (Signed)

## 2024-03-20 ENCOUNTER — Ambulatory Visit: Admitting: Orthopedic Surgery

## 2024-03-20 ENCOUNTER — Ambulatory Visit: Payer: Self-pay | Admitting: Orthopedic Surgery

## 2024-03-21 ENCOUNTER — Other Ambulatory Visit: Payer: Self-pay | Admitting: Urology

## 2024-03-21 DIAGNOSIS — R351 Nocturia: Secondary | ICD-10-CM

## 2024-03-25 ENCOUNTER — Ambulatory Visit: Payer: Self-pay | Admitting: Gerontology

## 2024-04-17 ENCOUNTER — Ambulatory Visit: Admitting: Orthopedic Surgery

## 2024-04-25 ENCOUNTER — Other Ambulatory Visit: Payer: Self-pay

## 2024-04-25 MED ORDER — TAMSULOSIN HCL 0.4 MG PO CAPS
0.4000 mg | ORAL_CAPSULE | Freq: Every day | ORAL | 2 refills | Status: AC
  Filled 2024-04-25 – 2024-05-14 (×2): qty 30, 30d supply, fill #0

## 2024-04-25 MED ORDER — TADALAFIL 5 MG PO TABS
5.0000 mg | ORAL_TABLET | Freq: Every day | ORAL | 11 refills | Status: AC
  Filled 2024-04-25 – 2024-05-14 (×2): qty 30, 30d supply, fill #0

## 2024-04-25 MED ORDER — OLMESARTAN MEDOXOMIL 20 MG PO TABS
20.0000 mg | ORAL_TABLET | Freq: Every day | ORAL | 11 refills | Status: AC
  Filled 2024-04-25 – 2024-05-14 (×2): qty 30, 30d supply, fill #0

## 2024-04-25 MED ORDER — AMLODIPINE BESYLATE 10 MG PO TABS
10.0000 mg | ORAL_TABLET | Freq: Every day | ORAL | 0 refills | Status: AC
  Filled 2024-04-25 – 2024-05-14 (×2): qty 90, 90d supply, fill #0

## 2024-04-25 NOTE — Progress Notes (Signed)
 CC: Preventative Health Exam  HPI Verbally consented to the use of AI for note-taking.   Christopher Harrell is a 53 y.o. here for preventative health exam  Preventative health exam: No acute issues.  Chronic medical issues stable and tolerating medications without adverse effects.  Exercises regularly and tries to adhere to healthy diet.  No exertional cp or syncopal episodes.  No urinary issues or rectal pain/bleeding.  Endorses tobacco use. 1/2 a day   History of Present Illness Christopher Harrell is a 53 year old male who presents to establish care with a new primary care provider.  He has experienced joint pain in his feet, ankles, and lower back for several years, which has worsened over time. The pain is accompanied by swelling, numbness, and difficulty stabilizing his feet. Activity and weather changes exacerbate the pain. He recalls being told he had inflammation and swelling in his joints and possibly arthritis. He recalls that x-rays of his lumbar region taken about a year and a half ago were reported to show swelling. He is not currently taking any medication specifically for his joint pain but uses cyclobenzaprine  5 mg once daily for muscle relaxation.  He has urinary symptoms including difficulty urinating, a strong urge with minimal output, and a sensation of pressure. He was previously diagnosed with an enlarged prostate and was taking Flomax , but he is currently out of the medication. He has been referred to urology in the past but has not followed up. He also uses Viagra  as needed for erectile dysfunction.  He is on multiple medications for hypertension, including amlodipine  10 mg, atenolol , losartan , and triamterene  HCTZ. He wants to reduce the number of blood pressure medications he is taking. No history of heart attack but reports high blood pressure.  He has a history of COPD and is a current smoker, smoking about half a pack per day. He has attempted to quit smoking using patches and  gum but experienced adverse effects. He has not tried Chantix.  He experiences numbness and tingling, which he associates with his diabetes. He was previously on metformin  500 mg but is not currently taking any diabetes medication. He reports that his blood sugars have improved.  He has not had a physical exam this year and reports issues with his teeth, including a broken tooth and discomfort. He has not seen a dentist in a while and has not had an eye exam recently, noting difficulty with reading small print.  He reports a mole on his leg that has increased in size over time. He has not seen a dermatologist for this issue.   ROS Review of systems is unremarkable for any active cardiac, respiratory, GI, GU, hematologic, neurologic, dermatologic, HEENT, or psychiatric symptoms except as noted above.  No fevers, chills, or constitutional symptoms.   Current Outpatient Medications  Medication Sig Dispense Refill   amLODIPine  (NORVASC ) 10 MG tablet Take 1 tablet (10 mg total) by mouth once daily 90 tablet 0   atorvastatin  (LIPITOR) 10 MG tablet Take 10 mg by mouth once daily     cyclobenzaprine  (FLEXERIL ) 5 MG tablet Take 1 tablet (5 mg total) by mouth once daily 30 tablet 2   tamsulosin  (FLOMAX ) 0.4 mg capsule Take 1 capsule (0.4 mg total) by mouth once daily 30 capsule 2   triamterene -hydroCHLOROthiazide  (DYAZIDE ) 37.5-25 mg capsule Take 1 capsule by mouth once daily     olmesartan  (BENICAR ) 20 MG tablet Take 1 tablet (20 mg total) by mouth once daily 30 tablet 11  tadalafiL  (CIALIS ) 5 MG tablet Take 1 tablet (5 mg total) by mouth once daily 30 tablet 11   No current facility-administered medications for this visit.    Allergies as of 04/25/2024   (No Known Allergies)    Patient Active Problem List  Diagnosis   Current smoker   COPD with acute exacerbation (CMS/HHS-HCC)   Diabetes mellitus without complication (CMS/HHS-HCC)   Erectile dysfunction   Essential  hypertension   Hypertriglyceridemia   Polyp of descending colon   Vitamin D  deficiency   Chronic midline low back pain with bilateral sciatica   Benign prostatic hyperplasia with incomplete bladder emptying    No past medical history on file.  History reviewed. No pertinent surgical history.  No family history on file.  Social History   Socioeconomic History   Marital status: Single  Tobacco Use   Smoking status: Every Day    Types: Cigarettes    Passive exposure: Current   Smokeless tobacco: Never  Vaping Use   Vaping status: Never Used  Substance and Sexual Activity   Alcohol use: Never   Drug use: Never   Sexual activity: Yes   Social Drivers of Corporate Investment Banker Strain: Low Risk  (04/25/2024)   Overall Financial Resource Strain (CARDIA)    Difficulty of Paying Living Expenses: Not hard at all  Food Insecurity: No Food Insecurity (04/25/2024)   Hunger Vital Sign    Worried About Running Out of Food in the Last Year: Never true    Ran Out of Food in the Last Year: Never true  Transportation Needs: No Transportation Needs (04/25/2024)   PRAPARE - Administrator, Civil Service (Medical): No    Lack of Transportation (Non-Medical): No  Physical Activity: Inactive (05/10/2023)   Received from [facility]   Exercise Vital Sign    On average, how many days per week do you engage in moderate to strenuous exercise (like a brisk walk)?: 0 days    On average, how many minutes do you engage in exercise at this level?: 0 min  Stress: No Stress Concern Present (05/10/2023)   Received from [facility] - Occupational Stress Questionnaire    Feeling of Stress : Only a little  Social Connections: Unknown (05/10/2023)   Received from [facility]   Social Connection and Isolation Panel    In a typical week, how many times do you talk on the phone with family, friends, or neighbors?: Once a week     How often do you get together with friends or relatives?: Once a week    How often do you attend church or religious services?: Never    Do you belong to any clubs or organizations such as church groups, unions, fraternal or athletic groups, or school groups?: No    How often do you attend meetings of the clubs or organizations you belong to?: Never  Housing Stability: Low Risk  (04/25/2024)   Housing Stability Vital Sign    Unable to Pay for Housing in the Last Year: No    Number of Times Moved in the Last Year: 0    Homeless in the Last Year: No    Health Maintenance  Topic Date Due   Potassium Level  Never done   Lipid Panel  Never done   Serum Calcium   Never done   Diabetes Screening  Never done   PSA  Never done   HIV Screen  Never  done   Hepatitis C Screen  Never done   Adult Tetanus (Td And Tdap)  Never done   Shingrix (1 of 2) Never done   Influenza Vaccine (1) 10/28/2024 (Originally 12/31/2023)   Pneumococcal Vaccine: 50+ (1 of 2 - PCV) 04/24/2025 (Originally [birth year]1988)   COVID-19 Vaccine (3 - 2025-26 season) 04/25/2025 (Originally 12/31/2023)   RSV Immunization Pregnant or 50+ (1 - Risk 50-74 years 1-dose series) 04/26/2039 (Originally 03/25/2019)   Depression Screening  04/25/2025   Annual Physical/Well Child Check  04/26/2025   Colorectal Cancer Screening  04/25/2032   Hib Vaccines  Aged Out   Hepatitis A Vaccines  Aged Out   Meningococcal B Vaccine  Aged Out   Meningococcal ACWY Vaccine  Aged Out   HPV Vaccines  Aged Out    Health habits: Dental Exam:been awhile planning to go Eye Exam:been awhile Planning to go Skin exam: Never; concern for spot on leg Exercise: 2 times/week on average  Current exercise activities: Walking Diet: Watching fatty/fried food intake Wears seat belt: Yes  PHQ 2/9 from today's flowsheet  PHQ-2 PHQ-2 Over the last 2 weeks, how often have you been bothered by any of the following problems? Little  interest or pleasure in doing things: Not at all Feeling down, depressed, or hopeless: Not at all Patient Health Questionnaire-2 Score: 0  PHQ-9 (if PHQ >=3)    Depression Severity and Treatment Recommendations:  0-4= None  5-9= Mild / Treatment: Support, educate to call if worse; return in one month  10-14= Moderate / Treatment: Support, watchful waiting; Antidepressant or Psychotherapy  15-19= Moderately severe / Treatment: Antidepressant OR Psychotherapy  >= 20 = Major depression, severe / Antidepressant AND Psychotherapy  A total of 15 minutes was spent completing the PHQ questionnaire and counseling patient.  Vitals:   04/25/24 1358  BP: (!) 150/68  Pulse: 86  SpO2: 98%  Weight: 86.4 kg (190 lb 6.4 oz)  Height: 175.3 cm (5' 9)  PainSc: 0-No pain  PainLoc: Foot   Body mass index is 28.12 kg/m.  Exam  General. Well appearing; NAD; VS reviewed     Eyes. Sclera and conjunctiva clear; Vision grossly intact; extraocular movements intact Neck. Supple. No swelling, masses, thyroid normal size, no masses palpated.  Lungs. Respirations unlabored; clear to auscultation bilaterally Back. No spinal deformity Cardiovascular. Heart regular rate and rhythm without murmurs, gallops, or rubs Abdomen. Soft; non tender; non distended; normoactive bowel sounds; no masses or organomegaly Lymph Nodes. No significant cervical or supraclavicular lymphadenopathy noted Musculoskeletal. No deformities; tenderness to palpation along the lateral lateral aspect near the lateral malleolus of bilateral ankles.  No swelling noted.  Some lumbar tenderness to palpation.  No spasms noted Extremities. no edema Skin. Normal color and turgor Pulses. Dorsalis pedis palpable and symmetric bilaterally Neurologic. Alert and oriented; speech intact; face symmetrical; moves all extremities well  Assessment and Plan  Preventative health exam - Stable exam.   - CV screening labs ordered and will review - PSA  regard and will review - Up to date on colonoscopy.   - Vaccinations reviewed.   - Counseled on nutrition modification and exercise.  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 Previous x-rays showed lumbar swelling and lamination. - Ordered x-ray of the lumbar spine. - Recommended Tylenol  1000 mg every 8 hours for pain management. - Discussed potential referral to physical therapy for  pain management.  Chronic bilateral ankle pain and swelling Chronic bilateral ankle pain and swelling, severe pain under the ball of the foot with instability. - Ordered x-ray of both ankles.  Benign prostatic hyperplasia with lower urinary tract symptoms Benign prostatic hyperplasia with significant lower urinary tract symptoms, worsening over eight months. Previous urology consultation suggested possible surgery. Currently on Flomax , discussed switching to Cialis  for dual benefit. - Refilled Flomax  prescription. - Switched from Viagra  to Cialis  5 mg daily. - Checked PSA levels. - Ordered urinalysis to rule out infection. - Will follow up in one month to assess symptom improvement before considering urology referral.  Essential hypertension Essential hypertension, currently managed with multiple antihypertensives. Blood pressure today was 150/68 mmHg. Plan to simplify regimen for better control. - Discontinued atenolol . - Switched losartan  to olmesartan  20 mg - Will follow up in three weeks to assess blood pressure control.  Type 2 diabetes mellitus Type 2 diabetes mellitus, previously managed with metformin . Current symptoms include numbness and tingling in feet, possibly related to diabetes or vitamin deficiencies. - Checked A1c levels. - Check CMP  Chronic obstructive pulmonary disease Chronic obstructive pulmonary disease, currently a smoker with over 15  years of history. Discussed smoking cessation options. - Referred to pulmonology for lung cancer screening and COPD management. - Discussed smoking cessation options, including Chantix.  Erectile dysfunction Erectile dysfunction, previously managed with Viagra . Plan to switch to Cialis  for dual benefit. - Switched from Viagra  to Cialis  5 mg daily.  Vitamin D  deficiency Vitamin D  deficiency, previously noted. - Checked vitamin D  levels.  Paresthesia of both feet Paresthesia of both feet, possibly related to diabetes or vitamin deficiencies. - Checked vitamin B12 and vitamin D  levels.  Nicotine  dependence Nicotine  dependence, currently smoking half a pack per day. Previous cessation attempts unsuccessful. Discussed potential use of Chantix. - Discussed smoking cessation options, including Chantix. - Discussed smoking cessation for greater than 3 minutes.  General health maintenance General health maintenance discussed, including vaccinations and screenings. Overdue for several vaccinations and screenings. - Administered tetanus vaccine. - Recommended shingles vaccine. - Recommended pneumonia vaccine due to frequent pneumonia. - Recommended hepatitis C and HIV screening. - Encouraged dental and eye exams. - Discussed RSV vaccine. - Discussed COVID booster.    Goals Addressed             This Visit's Progress    Maintain health/healthy lifestyle         Follow up: 3-week follow-up for recheck  This note has been created using automated tools and reviewed for accuracy by provider.  Attestation Statement:   I personally performed the service, non-incident to. (WP)   MASON MCCLELLAND MINOR, PA

## 2024-05-07 ENCOUNTER — Other Ambulatory Visit: Payer: Self-pay | Admitting: Urology

## 2024-05-07 DIAGNOSIS — R351 Nocturia: Secondary | ICD-10-CM

## 2024-05-09 ENCOUNTER — Other Ambulatory Visit: Payer: Self-pay

## 2024-05-14 ENCOUNTER — Other Ambulatory Visit: Payer: Self-pay

## 2024-05-19 ENCOUNTER — Other Ambulatory Visit: Payer: Self-pay

## 2024-05-19 MED ORDER — OLMESARTAN MEDOXOMIL 40 MG PO TABS
40.0000 mg | ORAL_TABLET | Freq: Every day | ORAL | 11 refills | Status: AC
  Filled 2024-05-19: qty 30, 30d supply, fill #0

## 2024-05-26 ENCOUNTER — Other Ambulatory Visit: Payer: Self-pay

## 2024-06-02 ENCOUNTER — Other Ambulatory Visit: Payer: Self-pay
